# Patient Record
Sex: Male | Born: 1960 | Race: White | Marital: Married | Smoking: Never smoker
Health system: Northeastern US, Academic
[De-identification: ages and names within clinical notes are randomized; demographics above are authoritative.]

## PROBLEM LIST (undated history)

## (undated) DIAGNOSIS — K5792 Diverticulitis of intestine, part unspecified, without perforation or abscess without bleeding: Secondary | ICD-10-CM

## (undated) DIAGNOSIS — M109 Gout, unspecified: Secondary | ICD-10-CM

## (undated) DIAGNOSIS — N200 Calculus of kidney: Secondary | ICD-10-CM

## (undated) DIAGNOSIS — I4891 Unspecified atrial fibrillation: Secondary | ICD-10-CM

## (undated) HISTORY — DX: Unspecified atrial fibrillation: I48.91

## (undated) HISTORY — DX: Gout, unspecified: M10.9

## (undated) HISTORY — DX: Diverticulitis of intestine, part unspecified, without perforation or abscess without bleeding: K57.92

## (undated) HISTORY — PX: FOREARM FRACTURE SURGERY: SHX649

## (undated) HISTORY — DX: Calculus of kidney: N20.0

## (undated) HISTORY — PX: ANKLE FRACTURE SURGERY: SHX122

---

## 2014-02-01 DIAGNOSIS — B029 Zoster without complications: Secondary | ICD-10-CM

## 2014-02-01 HISTORY — DX: Zoster without complications: B02.9

## 2017-07-02 HISTORY — PX: INGUINAL HERNIA REPAIR: SHX194

## 2018-06-07 DIAGNOSIS — M1611 Unilateral primary osteoarthritis, right hip: Secondary | ICD-10-CM | POA: Insufficient documentation

## 2018-07-03 HISTORY — PX: UMBILICAL HERNIA REPAIR: SHX196

## 2018-12-03 DIAGNOSIS — A419 Sepsis, unspecified organism: Secondary | ICD-10-CM

## 2018-12-03 DIAGNOSIS — N12 Tubulo-interstitial nephritis, not specified as acute or chronic: Secondary | ICD-10-CM

## 2018-12-03 HISTORY — DX: Tubulo-interstitial nephritis, not specified as acute or chronic: N12

## 2018-12-03 HISTORY — DX: Sepsis, unspecified organism: A41.9

## 2019-02-02 DIAGNOSIS — U071 COVID-19: Secondary | ICD-10-CM

## 2019-02-02 HISTORY — PX: LITHOTRIPSY: SUR834

## 2019-02-02 HISTORY — DX: COVID-19: U07.1

## 2019-10-03 HISTORY — PX: HIP REPLACEMENT: SHX530A

## 2020-01-08 ENCOUNTER — Other Ambulatory Visit: Payer: Self-pay | Admitting: Gastroenterology

## 2020-01-14 ENCOUNTER — Other Ambulatory Visit: Payer: Self-pay | Admitting: Gastroenterology

## 2020-01-18 ENCOUNTER — Other Ambulatory Visit
Admission: RE | Admit: 2020-01-18 | Discharge: 2020-01-18 | Disposition: A | Discharge status: Home / Self Care | Payer: Self-pay | Source: Ambulatory Visit

## 2020-01-18 DIAGNOSIS — N200 Calculus of kidney: Secondary | ICD-10-CM | POA: Insufficient documentation

## 2020-01-18 LAB — URINALYSIS REFLEX TO CULTURE
Blood,UA: NEGATIVE
Glucose,UA: NEGATIVE
Ketones, UA: NEGATIVE
Leuk Esterase,UA: NEGATIVE
Nitrite,UA: NEGATIVE
Protein,UA: NEGATIVE
Specific Gravity,UA: 1.018 (ref 1.002–1.030)
pH,UA: 6.5 (ref 5.0–8.0)

## 2020-02-05 DIAGNOSIS — M7551 Bursitis of right shoulder: Secondary | ICD-10-CM | POA: Insufficient documentation

## 2020-07-03 ENCOUNTER — Telehealth: Payer: Self-pay | Admitting: Gastroenterology

## 2020-07-03 NOTE — Telephone Encounter (Signed)
Called Pt, scheduled NPV and acute for hip pain - he will check with insurance on [place] vs [place] PCP acceptance and will call back once he has insurance info to transfer to [facility]

## 2020-07-03 NOTE — Telephone Encounter (Signed)
Pt's wife Sofie Rower (DOB [birth year] - Pt of Dr Hermelinda Medicus) called, stating he was just added to her insurance plan - needs a PCP in [place] - has a PCP in [facility] for 6 months of the year, having hip pain - needs it replaced but PCP in [place] has been giving him cortisone inj to help before he can get in for procedure, please advise if able to accept as a new patient and address hip pain, call # [phone]

## 2020-07-03 NOTE — Telephone Encounter (Signed)
Ok to schedule  But, please let patient know that I do not do joint injections - he would need to see an orthopedist.  Also he may want to check with insurance that he is allowed to see PCP in both [place] and [place]? I am not sure how that works.

## 2020-07-04 NOTE — Progress Notes (Signed)
Pre-Visit Planning    Health Maintenance Due   Topic Date Due   • COVID-19 Vaccine (1) Never done   • HIV Screening [facility]/[place]  Never done   • Hepatitis C Screening [facility]/[place]  Never done   • Colon Cancer Screening [facility]PSTF  Never done   • IMM-ZOSTER (1 of 2) Never done

## 2020-07-10 ENCOUNTER — Encounter: Payer: Self-pay | Admitting: Family Medicine

## 2020-07-10 ENCOUNTER — Ambulatory Visit: Payer: BLUE CROSS/BLUE SHIELD | Admitting: Family Medicine

## 2020-07-10 VITALS — BP 120/82 | HR 69 | Temp 96.6°F | Ht 76.0 in | Wt 279.0 lb

## 2020-07-10 DIAGNOSIS — Z0000 Encounter for general adult medical examination without abnormal findings: Secondary | ICD-10-CM

## 2020-07-10 DIAGNOSIS — M25511 Pain in right shoulder: Secondary | ICD-10-CM

## 2020-07-10 DIAGNOSIS — Z87442 Personal history of urinary calculi: Secondary | ICD-10-CM

## 2020-07-10 DIAGNOSIS — M109 Gout, unspecified: Secondary | ICD-10-CM

## 2020-07-10 DIAGNOSIS — N12 Tubulo-interstitial nephritis, not specified as acute or chronic: Secondary | ICD-10-CM | POA: Insufficient documentation

## 2020-07-10 DIAGNOSIS — G8929 Other chronic pain: Secondary | ICD-10-CM

## 2020-07-10 DIAGNOSIS — I480 Paroxysmal atrial fibrillation: Secondary | ICD-10-CM

## 2020-07-10 DIAGNOSIS — A419 Sepsis, unspecified organism: Secondary | ICD-10-CM | POA: Insufficient documentation

## 2020-07-10 DIAGNOSIS — I4891 Unspecified atrial fibrillation: Secondary | ICD-10-CM | POA: Insufficient documentation

## 2020-07-10 DIAGNOSIS — M1612 Unilateral primary osteoarthritis, left hip: Secondary | ICD-10-CM

## 2020-07-10 NOTE — Progress Notes (Signed)
Chief Complaint:   Chief Complaint   Patient presents with    New Patient Visit    Hip Pain       Patient ID: Jorge Boyd is a 60 y.o. man     HPI  Here to establish care with primary care doctor locally.  He does have a PCP and specialists in [place] that he sees regularly.  PCP in [facility] - Dr Mindi Junker [facility] [place], [place] - at least twice/year  ID - Dr Leanord Asal   Cardiologist - Dr Andrey Campanile     Recently married Jorge Boyd.   Will be splitting time between [place] and [place]. Likely spending summers in [place].   3 kids, 2 adult granddaughters.      Sepsis   Had umbilical hernia repair in June 2020.  Had a massive infection in 2020 - hospitalized. Was diagnosed with resistant bacteria.    Recovered. Then Nov 2020 had a 105 temp fever. Was septic due to kidney infection.   Hospitalized.   He also had multiple postop infections after right hip replacement in September 2021.  He does follow with ID down in [place].  Unclear why he has had recurrent postoperative infections.    Atrial fibrillation  First and only episode occurred during November 2020 hospitalization when he was septic and febrile.  HR over 200.   Was back in sinus within 1 month.   Since then, takes diltiazem 120 mg daily and aspirin 81 mg daily.   No further episodes of afib.   Now - HR 60s. Exercises daily.     History of kidney stones  March 2021 - had lithotripsy x2. Still had 1 small stone.   Is on HCTZ 25 mg daily, potassium citrate, and tamsulosin for prevention.     Covid-19 infection  Jan 2021  High fever for 2 weeks.   Gets antibody levels checked monthly because he is unvaccinated. Doesn't plan to be vaccinated until antibody levels drop.     ED  Has been an issue since COVID infection.  Takes tadalafil daily. Med is working well.     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 Dr Dayna Barker in [facility]. Multiple post-op infections.     Left hip arthritis  Significant pain 24/7  Pain is in the left groin, radiates down to knee and up into back.  Not taking  medication for it.  Dr Dayna Barker recommended left hip replacement.   Doesn't sleep well because of it - shooting pain with rolling over  Not having surgery on it til after 02/01/21. Likely will have surgery in [place].   Would like to have a steroid injection, because he did very well with right shoulder steroid injection.  Has not had a hip injection.  Requests referral to orthopedics today.    Gout   No flares since starting allopurinol - since around Feb 2020.   Previous gout flares have been in feet, ankles, hands and wrists.  Continues allopurinol 100 mg qAM and 200 mg nightly.     Right shoulder pain  Old injury - landed on it, history of overuse from baseball.   Possible small tear  Responded well to cortisone injection.   Doing exercises.     History of diverticulitis 2-3 times  Has had a colonoscopy.       Patient's medications, allergies, past medical, surgical, social and family histories were personally reviewed and updated in eRecord today.    Patient Active Problem List    Diagnosis Date Noted  Atrial fibrillation 07/10/2020    History of kidney stones 07/10/2020    Bursitis of right shoulder 02/05/2020    Osteoarthritis of right hip 06/07/2018     Past Medical History:   Diagnosis Date    Atrial fibrillation     COVID-19 02/2019    Gout     Nephrolithiasis     Pyelonephritis 12/2018    Sepsis 12/2018    due to pyelonephritis      Past Surgical History:   Procedure Laterality Date    ANKLE FRACTURE SURGERY Left     FOREARM FRACTURE SURGERY Left     HIP REPLACEMENT Right 10/2019    in [facility]    INGUINAL HERNIA REPAIR Right 07/2017    INGUINAL HERNIA REPAIR Left 2012    LITHOTRIPSY  2021    x2    UMBILICAL HERNIA REPAIR  07/2018    incisional hernia repaired     Current Outpatient Medications   Medication Sig    tamsulosin (FLOMAX) 0.4 mg capsule tamsulosin 0.4 mg capsule    tadalafil (CIALIS) 5 MG tablet     potassium citrate (UROCIT-K) 10 mEq (1080 mg) CR tablet Take 10 mEq by mouth daily       hydroCHLOROthiazide (HYDRODIURIL) 25 mg tablet 25 mg    dilTIAZem (TIAZAC, DILTZAC, TAZTIA XT) 120 mg 24 hr capsule diltiazem CD 120 mg capsule,extended release 24 hr    allopurinol (ZYLOPRIM) 300 mg tablet     aspirin 81 mg EC tablet Take 81 mg by mouth daily    Multiple Vitamin (MULTIVITAMIN PO) Take by mouth     No current facility-administered medications for this visit.     Allergies   Allergen Reactions    Demerol Hcl [Meperidine] Nausea And Vomiting     Family History   Adopted: Yes     Social History     Socioeconomic History    Marital status: Married     Spouse name: Not on file    Number of children: Not on file    Years of education: Not on file    Highest education level: Not on file   Tobacco Use    Smoking status: Never Smoker    Smokeless tobacco: Never Used   Substance and Sexual Activity    Alcohol use: Yes     Comment: rare    Drug use: Never    Sexual activity: Yes     Partners: Female     Comment: monogamous   Other Topics Concern    Not on file   Social History Narrative    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       Review of Systems   Constitutional: Positive for weight loss (10 lbs intentional). Negative for chills, fever and malaise/fatigue.   HENT: Positive for tinnitus (bilateral. high pitch, constant. years. ). Negative for congestion, ear pain, hearing loss and sore throat.    Eyes: Negative for blurred vision, double vision, pain, discharge and redness.   Respiratory: Negative for cough, shortness of breath and wheezing.    Cardiovascular: Negative for chest pain, palpitations, orthopnea and leg swelling.   Gastrointestinal: Negative for abdominal pain, blood in stool, constipation, diarrhea, heartburn, nausea and vomiting.   Genitourinary: Negative for dysuria, frequency, hematuria and urgency.        Nocturia 4/night. Drinks 2 gallons of water to prevent kidney stones.   Musculoskeletal: Positive for joint pain (left hip). Negative for  myalgias.   Skin: Negative  for itching and rash.   Neurological: Positive for headaches (stress related, mild, intermittent.). Negative for dizziness, tingling, sensory change and focal weakness.   Endo/Heme/Allergies: Negative for polydipsia. Does not bruise/bleed easily.   Psychiatric/Behavioral: Negative for depression. The patient is not nervous/anxious.        Objective:  BP 120/82 (BP Location: Left arm)    Pulse 69    Temp 35.9 C (96.6 F) (Temporal)    Ht 1.93 m (6\' 4" )    Wt 126.6 kg (279 lb)    SpO2 98%    BMI 33.96 kg/m   Physical Exam  Constitutional:       Appearance: Normal appearance.   HENT:      Head: Normocephalic.      Right Ear: Tympanic membrane, ear canal and external ear normal.      Left Ear: Tympanic membrane, ear canal and external ear normal.      Mouth/Throat:      Mouth: Mucous membranes are moist.      Pharynx: Oropharynx is clear. No oropharyngeal exudate.   Eyes:      General: No scleral icterus.        Right eye: No discharge.         Left eye: No discharge.      Conjunctiva/sclera: Conjunctivae normal.      Pupils: Pupils are equal, round, and reactive to light.   Neck:      Thyroid: No thyromegaly.   Cardiovascular:      Rate and Rhythm: Normal rate and regular rhythm.      Heart sounds: Normal heart sounds. No murmur heard.  Pulmonary:      Effort: Pulmonary effort is normal. No respiratory distress.      Breath sounds: Normal breath sounds.   Abdominal:      General: Bowel sounds are normal. There is no distension.      Palpations: Abdomen is soft. There is no mass.      Tenderness: There is no abdominal tenderness. There is no guarding or rebound.   Musculoskeletal:      Cervical back: Neck supple.      Right lower leg: No edema.      Left lower leg: No edema.   Lymphadenopathy:      Cervical: No cervical adenopathy.   Skin:     General: Skin is warm and dry.      Findings: No rash.   Neurological:      Mental Status: He is alert and oriented to person, place, and time.       Gait: Gait is intact.   Psychiatric:         Mood and Affect: Mood and affect normal.         Behavior: Behavior normal.         Assessment/Plan:    1. Arthritis of left hip  Significant left hip pain, mostly in the groin, but radiates down to the knee and up to back.  Interfering with sleep and activity.  Requesting steroid injection until he can have joint replacement.  - AMB REFERRAL TO ORTHOPEDIC SURGERY    2. History of kidney stones  History of lithotripsy x2 March 2021.   No symptoms of kidney stones currently.  Continues HCTZ 25 mg daily, potassium citrate, and tamsulosin for prevention.   - Aerobic culture; Future  - Urinalysis with reflex to microscopic; Future    3. Chronic right shoulder pain  Has seen orthopedist in [place] for this.  Old injury and overuse.   Responded well to cortisone injection.   Continues home exercises.    4. Paroxysmal atrial fibrillation  Single episode of atrial fibrillation with RVR Nov 2020 while hospitalized with sepsis.  Sees cardiology in [place] - Dr Andrey Campanile.   Continues diltiazem 120 mg daily and aspirin 81 mg daily.   - CBC and differential; Future  - Comprehensive metabolic panel; Future  - Lipid Panel (Reflex to Direct  LDL if Triglycerides more than 400); Future    5. Gout  Previous gout flares have been in feet, ankles, hands and wrists.  Continues allopurinol 100 mg qAM and 200 mg nightly.   - Uric acid; Future    6. Health care maintenance  - CBC and differential; Future  - Comprehensive metabolic panel; Future  - Lipid Panel (Reflex to Direct  LDL if Triglycerides more than 400); Future  - Hemoglobin A1c; Future  - PSA (eff.05-2008); Future    7.  History of postop infections  History of febrile postop infections and sepsis after umbilical hernia repair and right hip replacement.  Unclear why he has had such severe infections.  He does see ID down in [place], Dr Leanord Asal.   Patient requested that if he has symptoms concerning for infection, he be allowed to get  blood work and urine testing done urgently given his history.  Advised him to contact office if this occurs.      Follow up: as needed

## 2020-07-14 ENCOUNTER — Ambulatory Visit: Payer: BLUE CROSS/BLUE SHIELD | Admitting: Orthopedic Surgery

## 2020-07-14 ENCOUNTER — Encounter: Payer: Self-pay | Admitting: Orthopedic Surgery

## 2020-07-14 ENCOUNTER — Ambulatory Visit
Admission: RE | Admit: 2020-07-14 | Discharge: 2020-07-14 | Disposition: A | Discharge status: Home / Self Care | Payer: BLUE CROSS/BLUE SHIELD | Source: Ambulatory Visit

## 2020-07-14 VITALS — BP 135/81 | HR 89 | Ht 76.0 in | Wt 278.0 lb

## 2020-07-14 DIAGNOSIS — M25552 Pain in left hip: Secondary | ICD-10-CM

## 2020-07-14 DIAGNOSIS — M1612 Unilateral primary osteoarthritis, left hip: Secondary | ICD-10-CM

## 2020-07-14 DIAGNOSIS — M76892 Other specified enthesopathies of left lower limb, excluding foot: Secondary | ICD-10-CM

## 2020-07-14 NOTE — H&P (Signed)
CC: Left hip pain    HPI: Jorge Boyd is a 60 y.o. male who presents with many years history of Left hip pain.  It began 2018. It is localized to the groin and radiates to the thigh.  It is worse with activities such as walking, playing softball, standing, and improved with rest.  It does wake them up at night.  The patient is able to reciprocate stairs.  They do not walk with an assistive device.  Their leg lengths feel equal on the affected side.  The patient takes Ibuprofen for the pain, which provides temporary relief.  The patient has tried activity modification, weight loss, and physical therapy which all provide only temporary relief.  The patient has not had an intra-articular injection into the hip.  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  He was interested in a cortisone injection to help him with his season.    The patient denies any radicular symptoms, fevers, chills, or recent falls.    PMH     Past Medical History:   Diagnosis Date    Atrial fibrillation     COVID-19 02/2019    Diverticulitis     Gout     Nephrolithiasis     Pyelonephritis 12/2018    Sepsis 12/2018    due to pyelonephritis     Shingles 2016       PSH  Past Surgical History:   Procedure Laterality Date    ANKLE FRACTURE SURGERY Left     FOREARM FRACTURE SURGERY Left     HIP REPLACEMENT Right 10/2019    in [facility]    INGUINAL HERNIA REPAIR Right 07/2017    INGUINAL HERNIA REPAIR Left 2012    LITHOTRIPSY  2021    x2    UMBILICAL HERNIA REPAIR  07/2018    incisional hernia repaired       Medications  Current Outpatient Medications   Medication    tamsulosin (FLOMAX) 0.4 mg capsule    tadalafil (CIALIS) 5 MG tablet    potassium citrate (UROCIT-K) 10 mEq (1080 mg) CR tablet    hydroCHLOROthiazide (HYDRODIURIL) 25 mg tablet    dilTIAZem (TIAZAC, DILTZAC, TAZTIA XT) 120 mg 24 hr capsule    allopurinol  (ZYLOPRIM) 300 mg tablet    aspirin 81 mg EC tablet    Multiple Vitamin (MULTIVITAMIN PO)     No current facility-administered medications for this visit.       Allergies  Allergies   Allergen Reactions    Demerol Hcl [Meperidine] Nausea And Vomiting       Social History   reports that he has never smoked. He has never used smokeless tobacco. He reports current alcohol use. He reports that he does not use drugs.    Review of Systems  A  review of systems was conducted.  Pertinent positive and negative findings other than what is documented in the hpi are negative.    Physical Examination:  General: well appearing no acute distress at rest  Skin: no evidence of rashes, bruising, open wounds; right hip incision anterior approach well healed    Lower Extremity Musculoskeletal Examination:  Gait: walks with a coxalgic gait   Leg length: on supine examination, leg lengths appear fairly equal .    Peripheral vascular: no edema, lower extremity warm and well perfused    Right Hip: no tenderness to palpation over  greater trochanter, hip ROM flexion 0-100 degrees, internal rotation in flexion 25 degrees with no pain, external rotation in flexion 45 degrees, abduction in flexion 40 degrees, adduction in flexion 35 degrees. has not pain with straight leg raise.  No evidence of subluxation or laxity. Normal muscle tone, no spasticity or atrophy.    Right Knee: Neutral mechanical alignment of the knee.  ROM 0-120 painless.      Left Hip: no tenderness to palpation over greater trochanter, hip ROM flexion 0-100 degrees, internal rotation in flexion 10 degrees with groin pain, external rotation in flexion 35 degrees, abduction in flexion 30 degrees, adduction in flexion 25 degrees.  has groin pain with straight leg raise. Normal muscle tone.    Left Knee: neutral mechanical alignment of the knee.  ROM 0-120 painless.      Neurovascular exam: 5/5 strength in quadriceps/hamstring/gastrocs/tibialis anterior/extensor halluces  longus bilaterally, sensation is intact to light touch SP/DP/TN/saphenous distributions bilaterally, distal extremity is warm and perfused.    Radiographs:   I personally reviewed the following imaging with pertinent findings:  AP and lateral Hip dated today ordered by me was reviewed and interpreted from me today shows severe osteoarthritis of the left hip with complete loss of superolateral joint space, subchondral sclerosis .  No other bony or soft tissue injury seen.    A/P: Jorge Boyd is a 60 y.o. male with severe osteoarthritis of the left hip.  We discussed his diagnosis and treatment options.   I discussed with the patient continued conservative management including anti-inflammatory medications (aleve, advil) and tylenol, activity modification, weight loss, use of assistive devices, and intra-articular injections such as cortisone.  We also discussed the indications and expected recovery from total hip replacement, which he is familiar with due to his right THA previously.  He wanted to pursue a cortisone injection to get him through his softball season, I placed this referral for him.  He can continue ibuprofen/tylenol as tolerated.  He will call me if interested in hip replacement, he thought possibly this winter would work for him.  All questions were answered by me.

## 2020-07-16 ENCOUNTER — Encounter: Payer: Self-pay | Admitting: Orthopedic Surgery

## 2020-07-21 ENCOUNTER — Encounter: Payer: Self-pay | Admitting: Family Medicine

## 2020-07-31 ENCOUNTER — Encounter: Payer: Self-pay | Admitting: Physical Medicine and Rehabilitation

## 2020-07-31 ENCOUNTER — Ambulatory Visit
Payer: BLUE CROSS/BLUE SHIELD | Attending: Physical Medicine and Rehabilitation | Admitting: Physical Medicine and Rehabilitation

## 2020-07-31 VITALS — BP 126/84 | HR 77 | Temp 97.9°F | Ht 76.0 in | Wt 278.0 lb

## 2020-07-31 DIAGNOSIS — M1612 Unilateral primary osteoarthritis, left hip: Secondary | ICD-10-CM | POA: Insufficient documentation

## 2020-07-31 MED ORDER — LIDOCAINE HCL 1 % IJ SOLN *I*
0.5000 mL | Freq: Once | INTRAMUSCULAR | Status: AC | PRN
Start: 2020-07-31 — End: 2020-07-31
  Administered 2020-07-31: .5 mL via INTRA_ARTICULAR

## 2020-07-31 MED ORDER — LIDOCAINE HCL 1 % IJ SOLN *I*
1.5000 mL | Freq: Once | INTRAMUSCULAR | Status: AC | PRN
Start: 2020-07-31 — End: 2020-07-31
  Administered 2020-07-31: 1.5 mL via INTRA_ARTICULAR

## 2020-07-31 MED ORDER — LIDOCAINE HCL 1 % IJ SOLN *I*
1.0000 mL | Freq: Once | INTRAMUSCULAR | Status: AC | PRN
Start: 2020-07-31 — End: 2020-07-31
  Administered 2020-07-31: 1 mL via INTRA_ARTICULAR

## 2020-07-31 MED ORDER — TRIAMCINOLONE ACETONIDE 40 MG/ML IJ SUSP *I*
80.0000 mg | Freq: Once | INTRAMUSCULAR | Status: AC | PRN
Start: 2020-07-31 — End: 2020-07-31
  Administered 2020-07-31: 80 mg via INTRA_ARTICULAR

## 2020-07-31 NOTE — Procedures (Signed)
Procedure: Ultrasound guided left intra-articular hip joint injection      The procedure was carried out under sterile prep with sterile gel.  A brief sonographic assessment of the region showed a small femoral head osteophyte and a small joint effusion. A 22 gauge 3.5 inch needle was introduced and advanced with ultrasound guidance from distal to proximal to the femoral head deep to the capsule, where a mixture of local anesthetic and corticosteroid was injected.     Ultrasound interpretation was performed prior to the procedure to identify the target and any adjacent neurovascular structures. Subsequently, interpretation was performed during real-time needle guidance confirming placement. Post-intervention interpretation was also performed confirming appropriate injectate flow and hemostasis. The patient tolerated the procedure without difficulty. Warning signs and routine aftercare were discussed with the patient.     Daryel Gerald DO  Physical Medicine and Rehabilitation        Large Joint Aspiration/Injection Procedure: L hip joint    Date/Time: 07/31/2020  8:20 AM EDT  Consent given by: patient (Risks, benefits, and alternatives to injection were discussed, including the possibility of pain, bleeding, infection, color change of skin, systemic reaction and lack of clinical improvement. They verbalized understanding.)  Site marked: site marked  Timeout: Immediately prior to procedure a time out was called to verify the correct patient, procedure, equipment, support staff and site/side marked as required     Procedure Details    Location: hip - L hip joint  Preparation: The site was prepped using the usual aseptic technique.  Ultrasound guidance:  Ultrasound was utilized to improve needle visualization, injection accuracy, and anatomic localization.    Anesthetics administered: 1.5 mL lidocaine HCL 1 %; 1 mL lidocaine HCL 1 %; 0.5 mL lidocaine HCL 1 %  Intra-Articular Steroids administered: 80 mg triamcinolone  acetonide 40 MG/ML  Dressing:  A dry, sterile dressing was applied.  Patient tolerance: patient tolerated the procedure well with no immediate complications      For this injection, the patient has agreed to participate in the research study titled "Comparison of ultrasound-guided intraarticular hip injections with and without prior local anesthesia: a randomized controlled trial" (RSRB [id number]). Informed consent was obtained and documented using a Research Subjects Review Board-approved consent form, which will be scanned and added to the patient's electronic health record.

## 2020-07-31 NOTE — Patient Instructions (Signed)
-   My secretary's number is [phone].    Physical Medicine and Rehabilitation - Post Injection Instructions    You received a steroid (cortisone) injection today.     When will I start to experience pain relief?  You may get some immediate relief if numbing medicine (lidocaine/bupivicaine) is used, but this wears off in several hours.  The therapeutic effect from the steroid may take up to 1-2 weeks.    What are the possible side effects?  The most common side effect from injection procedures is a transient increase in pain for the first 24-72 hours. Possible minor, temporary, and treatable side effects include post-injection muscle soreness, localized bruising, swelling or redness, temporary lightheadedness, dizziness or fainting, temporary increased blood sugar or blood pressure. These side effects typically resolve on their own within 1-2 days. Rare (<1%) but more severe risks include bleeding, allergic reaction, or infection  What should I do after my injection?  • You may ice the injection site for local discomfort. Ice should be covered with a cloth - never place directly on skin. Apply for 15 minutes on, then 1 hour off. Repeat as needed.  • Avoid soaking the injection site in water for at least 24 hours (no baths, hot tubs, or pools) - showering is OK.  • Avoid strenuous activity in the injected body part for 2 days after injection.    For what reasons should I contact my physician after an injection?  • A temperature of greater than 100°F that is not improving  • Pain that is severe and worsening  • Severe redness, swelling or drainage from the injection site  · In case of a question or concern between 8 AM and 4:30 PM Monday through Friday, reach out through MyChart (a response through MyChart is often faster) or call your doctor’s office.  · Seek urgent medical care if staff is unavailable.   · For urgent issues before 8 AM or after 4:30 PM, on weekends or holidays, please contact the on-call physician  at [phone].    Office numbers:  Drs. Nailah Luepke, Sidhu, Salim -    Drs. Lazaro, Paul, Hauber, Taddeo -   Drs. Snyder, El Hassan -    Drs. Adler, Nickels, Morrison - [phone]  [phone]  [phone]  [phone]     Billing information for injection procedures  We want you well-informed about billing and insurance issues related to your care. Please read the following information carefully. If you have questions or concerns, our office would be happy to discuss them with you.  · You will receive two bills, one from the physician performing the injection and one from [facility] for the use of the room and medical supplies. The physician bill will come from [facility] and will read “Statement of Professional Services”. The hospital bill will come from [facility]. Many insurance companies will require a separate co-payment/co-insurance for each bill. If you have questions regarding your bill, please reach out to the billing office at [phone].  · We urge you to become familiar, in advance, with what your insurance policy covers, as well as any co-payment and deductibles. We are glad to assist you and answer questions, but payment and insurance coverage are ultimately your responsibility.

## 2020-07-31 NOTE — Progress Notes (Signed)
[facility] Physical Medicine & Rehabilitation Clinic Note  Name: Elvis Laufer   DOB: April 24, 1960   Date: 07/31/2020   Referring Provider:  Rae Halsted, PA     Chief Complaint: Nasiir Monts is a 60 y.o. male who presents with left hip pain.    Subjective     History of Present Illness:  Pain location: Hip (Left hip)  Pain score:   2  Pain frequency: Continuous  Pain description: Aching, Sharp       Goal for today's visit: cortisone    Pain location: groin, lateral and other (down to the knee). Pain began years ago.    Was playing softball, noticed more hip pain, kept playing, pain worsened. His surgeon offered him an injection but he declined at the time. Since then any activity has caused significant pain for days.     The pain has been gradually worsening since onset.     It is aggravated by exercise and sitting.     It is alleviated by nothing.     Associated symptoms: difficulty sleeping due to pain and stiffness       Social Hx/Occupation: Chartered certified accountant, Research scientist (medical)    Current level of function: Active - regular exercise routine with organized exercise multiple times per week    Desired level of function: Very Active - dedicated daily exercise routine    Pertinent medical history:        Diabetes: none       Hypertension: well controlled       Active Smoker: No        Other: history of Afib in the seting of sever sepsis, not chronic, not on anticoagulation    Mental health history: no history of mental health disorder          Treatments tried for the hip:     Home Exercise Program: no relief    Physical Therapy: not tried    Heat: not tried    Ice: no relief   Assistive device: - not tried   Topicals: - no relief    Acetaminophen: no relief   NSAIDs: - no relief    Steroid injections: not tried    Surgery: history of right THA    Objective   Physical Exam:      Left Hip:   Inspection:       Pelvic obliquity: symmetric  Palpation:       Tenderness: no tenderness      Crepitus: No    Range of Motion:        Internal rotation (90/90): 30, with lateral and groin pain      External rotation (90/90): 60, with lateral and groin pain  Special Tests:       Straight leg raise: Negative        FABER: Positive (Groin and lateral pain)      FADIR: Negative        Stinchfield: Negative     Gait:        Gait normal       Assistive device: none    Vitals: BP 126/84    Pulse 77    Temp 36.6 C (97.9 F) (Temporal)    Ht 1.93 m (6\' 4" )    Wt 126.1 kg (278 lb)    BMI 33.84 kg/m      Laboratory  No results found for: PA1C, HA1C, CREAT        Imaging:  X-rays of the left hip performed 07/14/2020 -  I personally reviewed the images on 07/31/2020 which showed: Moderate degenerative changes, with joint space narrowing, subchondral sclerosis and osteophytes, consistent with osteoarthritis.     Assessment     ICD-10-CM ICD-9-CM   1. Primary osteoarthritis of left hip  M16.12 715.15       The presenting problem is chronic, with progression/exacerbation    The patient's description of symptoms, physical examination findings, and review of plain film radiographs are all consistent with a diagnosis of hip osteoarthritis.  We discussed the degenerative and progressive nature of this disease.  We discussed the wide array of treatment options and evidence based recommendations from several of the national and international organizations specializing in the treatment of osteoarthritis.    For this patient, the following non-operative treatment options were strongly considered with high levels of evidence:  - Exercise - low impact exercise, including but not limited to: walking, strengthening, neuromuscular training, and aquatic exercise  - Self-Efficacy and self-management programs  - Appropriate weight management   - Use of Cane  - Oral NSAIDs (as allowable given chronic medical comorbidities)  - Intraarticular glucocorticoid injections (as allowable given chronic medical comorbidities)    The following non-operative treatment options were  considered with lower levels of evidence:  - Heat, therapeutic cooling such as hot or cold packs  - Acetaminophen    The following non-operative treatment options were considered but not recommended for initial treatment. Data regarding these options is conflicting and they are considered when the patient has failed the above, more ideal options:  - None    The following non-operative treatment options were considered but often recommended against and reserved for cases in which the patient has failed more strongly recommended therapies   - Glucosamine and chondroitin         Today's Plan:    Ultimately, the following treatment plan was developed based on discussion of possible treatment options, response to previous treatments, medical/psychosocial barriers, and the patients motivation and desire for specific options:      - Recommended physical exercise in the form of home exercise program  - Discussed use of ice and heat  - Discussed judicious use of acetaminophen  - Discussed judicious use of over the counter NSAIDs    - Ultrasound-guided left intra-articular hip steroid injection today. Risks, benefits and alternatives were discussed.  - He plans to have the left hip replaced in the Fall/Winter but wishes for pain relief in the meantime.     If the above plan fails to improve his symptoms, the following treatment options would be considered next:  - Surgical referral    Monitoring of modifiable risk factors:  HTN status: yes - well controlled  Diabetes status: no  No results found for: HA1C  Active smoker: No  Tobacco Use: Low Risk     Smoking Tobacco Use: Never Smoker    Smokeless Tobacco Use: Never Used      Weight:  BMI Readings from Last 1 Encounters:   07/31/20 33.84 kg/m        Follow up if symptoms worsen or fail to improve.    Meds and Orders Placed this Visit:  1. Primary osteoarthritis of left hip    The above documented evaluation and management was performed separately and in addition to the  procedure.      Timoteo Ace, DO  Physical Medicine & Rehabilitation  Please excuse any grammatical errors, this note was partially typed/dictated at point of care.

## 2020-08-08 ENCOUNTER — Ambulatory Visit: Payer: BLUE CROSS/BLUE SHIELD | Admitting: Physical Medicine and Rehabilitation

## 2020-08-30 ENCOUNTER — Encounter: Payer: Self-pay | Admitting: Family Medicine

## 2020-10-03 ENCOUNTER — Other Ambulatory Visit
Admission: RE | Admit: 2020-10-03 | Discharge: 2020-10-03 | Disposition: A | Discharge status: Home / Self Care | Payer: BLUE CROSS/BLUE SHIELD | Source: Ambulatory Visit | Attending: Family Medicine | Admitting: Family Medicine

## 2020-10-03 DIAGNOSIS — Z0000 Encounter for general adult medical examination without abnormal findings: Secondary | ICD-10-CM | POA: Insufficient documentation

## 2020-10-03 DIAGNOSIS — Z87442 Personal history of urinary calculi: Secondary | ICD-10-CM | POA: Insufficient documentation

## 2020-10-03 DIAGNOSIS — I480 Paroxysmal atrial fibrillation: Secondary | ICD-10-CM | POA: Insufficient documentation

## 2020-10-03 DIAGNOSIS — M109 Gout, unspecified: Secondary | ICD-10-CM | POA: Insufficient documentation

## 2020-10-03 LAB — LIPID PANEL
Chol/HDL Ratio: 5.2
Cholesterol: 199 mg/dL
HDL: 38 mg/dL — ABNORMAL LOW (ref 40–60)
LDL Calculated: 138 mg/dL — AB
Non HDL Cholesterol: 161 mg/dL
Triglycerides: 114 mg/dL

## 2020-10-03 LAB — COMPREHENSIVE METABOLIC PANEL
ALT: 26 U/L (ref 0–50)
AST: 24 U/L (ref 0–50)
Albumin: 4.7 g/dL (ref 3.5–5.2)
Alk Phos: 96 U/L (ref 40–130)
Anion Gap: 12 (ref 7–16)
Bilirubin,Total: 0.9 mg/dL (ref 0.0–1.2)
CO2: 25 mmol/L (ref 20–28)
Calcium: 9.9 mg/dL (ref 8.6–10.2)
Chloride: 101 mmol/L (ref 96–108)
Creatinine: 1.15 mg/dL (ref 0.67–1.17)
Glucose: 103 mg/dL — ABNORMAL HIGH (ref 60–99)
Lab: 22 mg/dL — ABNORMAL HIGH (ref 6–20)
Potassium: 4.2 mmol/L (ref 3.3–5.1)
Sodium: 138 mmol/L (ref 133–145)
Total Protein: 6.9 g/dL (ref 6.3–7.7)
eGFR BY CREAT: 74 *

## 2020-10-03 LAB — URINALYSIS WITH REFLEX TO MICROSCOPIC
Blood,UA: NEGATIVE
Glucose,UA: NEGATIVE
Ketones, UA: NEGATIVE
Nitrite,UA: NEGATIVE
Protein,UA: NEGATIVE
Specific Gravity,UA: 1.014 (ref 1.002–1.030)
pH,UA: 6.5 (ref 5.0–8.0)

## 2020-10-03 LAB — URIC ACID: Urate: 6.7 mg/dL (ref 3.9–9.0)

## 2020-10-03 LAB — CBC AND DIFFERENTIAL
Baso # K/uL: 0.1 10*3/uL (ref 0.0–0.1)
Basophil %: 1.2 %
Eos # K/uL: 0.2 10*3/uL (ref 0.0–0.5)
Eosinophil %: 2.5 %
Hematocrit: 48 % (ref 40–51)
Hemoglobin: 16.5 g/dL (ref 13.7–17.5)
IMM Granulocytes #: 0 10*3/uL (ref 0.0–0.0)
IMM Granulocytes: 0.3 %
Lymph # K/uL: 1.5 10*3/uL (ref 1.3–3.6)
Lymphocyte %: 20.2 %
MCH: 32 pg (ref 26–32)
MCHC: 34 g/dL (ref 32–37)
MCV: 92 fL (ref 79–92)
Mono # K/uL: 0.7 10*3/uL (ref 0.3–0.8)
Monocyte %: 9.4 %
Neut # K/uL: 5 10*3/uL (ref 1.8–5.4)
Nucl RBC # K/uL: 0 10*3/uL (ref 0.0–0.0)
Nucl RBC %: 0 /100 WBC (ref 0.0–0.2)
Platelets: 229 10*3/uL (ref 150–330)
RBC: 5.2 MIL/uL (ref 4.6–6.1)
RDW: 13.5 % (ref 11.6–14.4)
Seg Neut %: 66.4 %
WBC: 7.6 10*3/uL (ref 4.2–9.1)

## 2020-10-03 LAB — URINE MICROSCOPIC (IQ200)
Bacteria,UA: NONE SEEN
Hyaline Casts,UA: NONE SEEN /lpf (ref 0–5)
Squam Epithel,UA: NONE SEEN /lpf (ref 0–?)

## 2020-10-03 LAB — PSA (EFF.4-2010): PSA (eff. 4-2010): 2.76 ng/mL (ref 0.00–4.00)

## 2020-10-03 LAB — HEMOGLOBIN A1C: Hemoglobin A1C: 5.5 %

## 2020-10-04 LAB — AEROBIC CULTURE: Aerobic Culture: 0

## 2020-10-15 ENCOUNTER — Encounter: Payer: Self-pay | Admitting: Family Medicine

## 2020-10-16 NOTE — Progress Notes (Deleted)
Pre-Visit Planning    Health Maintenance Due   Topic Date Due   • COVID-19 Vaccine (1) Never done   • HIV Screening USPSTF/[place]  Never done   • Hepatitis C Screening USPSTF/[place]  Never done   • Colon Cancer Screening USPSTF  Never done   • IMM-ZOSTER (1 of 2) Never done   • IMM-INFLUENZA (1) Never done

## 2020-11-14 ENCOUNTER — Encounter: Payer: Self-pay | Admitting: Family Medicine

## 2020-11-21 ENCOUNTER — Other Ambulatory Visit: Payer: Self-pay | Admitting: Orthopedic Surgery

## 2020-11-21 ENCOUNTER — Telehealth: Payer: Self-pay

## 2020-11-21 DIAGNOSIS — M1612 Unilateral primary osteoarthritis, left hip: Secondary | ICD-10-CM

## 2020-11-21 NOTE — Telephone Encounter (Signed)
Dx: Osteoarthritis of left hip [M76.72 (ICD-10-CM)]       Order History  Outpatient  Date/Time Action Taken User Additional Information   11/21/20 1426 Sign Zella Ball, MD      Order Details    Frequency Duration Priority Order Class   None None Routine Clinic Performed     Comments    PCP clearance   December           Order Questions    Question Answer Comment   Nurse Navigator Review Required No    Procedure: 27130 - Total Hip Arthroplasty December   Approach Anterior    Laterality Left    Note: Enter the laterality for the procedure.   Duration (Minutes): 120    Anesthesia Regional    Equipment hana bed, fluoroscopy, Depuy pinacle and actis, ancef and vanco preop    Pre Procedural Lab Orders: MRSA Panel     CMP - Lab17     CBC - CNO709     Protime-INR - Lab320     APTT - Lab325     Type and Screen - Lab276     Hemoglobin A1c - [id number]    Pre Procedural Imaging Orders: Outpatient Left Hip and Pelvis - Order Panel      LM in [facility] to complete [facility] HEALTH HISTORY QUESTIONNAIRE ASAP  Booking Order sent to [employer]

## 2020-11-24 ENCOUNTER — Encounter: Payer: Self-pay | Admitting: Orthopedic Surgery

## 2020-11-24 ENCOUNTER — Other Ambulatory Visit: Payer: Self-pay | Admitting: Orthopedic Surgery

## 2020-11-24 DIAGNOSIS — Z01818 Encounter for other preprocedural examination: Secondary | ICD-10-CM

## 2020-12-10 ENCOUNTER — Encounter: Payer: Self-pay | Admitting: Family Medicine

## 2021-01-04 ENCOUNTER — Encounter: Payer: Self-pay | Admitting: Family Medicine

## 2021-01-04 DIAGNOSIS — M545 Low back pain, unspecified: Secondary | ICD-10-CM

## 2021-01-04 DIAGNOSIS — R509 Fever, unspecified: Secondary | ICD-10-CM

## 2021-01-05 ENCOUNTER — Encounter: Payer: Self-pay | Admitting: Family Medicine

## 2021-01-05 NOTE — Telephone Encounter (Signed)
Can we get pt in for evaluation with Dr. Barron Alvine? We cant order testing with out him being seen. Please call pt if there is an opening. If you could let me know I would appreciate it. Val S LPN

## 2021-01-05 NOTE — Telephone Encounter (Signed)
Yes ok for tomorrow as long as pt feels he can wait. He needs to be seen asap as UTI can get bad very quickly. He can go to urgent care. We can not treat over the phone. Val S LPN

## 2021-01-05 NOTE — Telephone Encounter (Signed)
Nothing left for today and only same days tomorrow please advise okay to use one?

## 2021-01-06 ENCOUNTER — Other Ambulatory Visit: Payer: Self-pay | Admitting: Family Medicine

## 2021-01-06 ENCOUNTER — Encounter: Payer: Self-pay | Admitting: Family Medicine

## 2021-01-06 ENCOUNTER — Other Ambulatory Visit
Admission: RE | Admit: 2021-01-06 | Discharge: 2021-01-06 | Disposition: A | Discharge status: Home / Self Care | Payer: BLUE CROSS/BLUE SHIELD | Source: Ambulatory Visit | Attending: Family Medicine | Admitting: Family Medicine

## 2021-01-06 DIAGNOSIS — R509 Fever, unspecified: Secondary | ICD-10-CM | POA: Insufficient documentation

## 2021-01-06 DIAGNOSIS — N390 Urinary tract infection, site not specified: Secondary | ICD-10-CM

## 2021-01-06 DIAGNOSIS — M545 Low back pain, unspecified: Secondary | ICD-10-CM | POA: Insufficient documentation

## 2021-01-06 DIAGNOSIS — N12 Tubulo-interstitial nephritis, not specified as acute or chronic: Secondary | ICD-10-CM

## 2021-01-06 LAB — CBC AND DIFFERENTIAL
Baso # K/uL: 0.1 10*3/uL (ref 0.0–0.1)
Basophil %: 1 %
Eos # K/uL: 0.2 10*3/uL (ref 0.0–0.5)
Eosinophil %: 2.6 %
Hematocrit: 50 % (ref 40–51)
Hemoglobin: 16.4 g/dL (ref 13.7–17.5)
IMM Granulocytes #: 0 10*3/uL (ref 0.0–0.0)
IMM Granulocytes: 0.3 %
Lymph # K/uL: 1.3 10*3/uL (ref 1.3–3.6)
Lymphocyte %: 14.5 %
MCH: 31 pg (ref 26–32)
MCHC: 33 g/dL (ref 32–37)
MCV: 93 fL — ABNORMAL HIGH (ref 79–92)
Mono # K/uL: 0.9 10*3/uL — ABNORMAL HIGH (ref 0.3–0.8)
Monocyte %: 9.3 %
Neut # K/uL: 6.7 10*3/uL — ABNORMAL HIGH (ref 1.8–5.4)
Nucl RBC # K/uL: 0 10*3/uL (ref 0.0–0.0)
Nucl RBC %: 0 /100 WBC (ref 0.0–0.2)
Platelets: 261 10*3/uL (ref 150–330)
RBC: 5.3 MIL/uL (ref 4.6–6.1)
RDW: 13.2 % (ref 11.6–14.4)
Seg Neut %: 72.3 %
WBC: 9.3 10*3/uL — ABNORMAL HIGH (ref 4.2–9.1)

## 2021-01-06 LAB — URINALYSIS WITH REFLEX TO MICROSCOPIC
Glucose,UA: NEGATIVE
Ketones, UA: NEGATIVE
Nitrite,UA: POSITIVE — AB
Specific Gravity,UA: 1.018 (ref 1.002–1.030)
pH,UA: 7 (ref 5.0–8.0)

## 2021-01-06 LAB — COMPREHENSIVE METABOLIC PANEL
ALT: 27 U/L (ref 0–50)
AST: 20 U/L (ref 0–50)
Albumin: 4.7 g/dL (ref 3.5–5.2)
Alk Phos: 112 U/L (ref 40–130)
Anion Gap: 14 (ref 7–16)
Bilirubin,Total: 0.7 mg/dL (ref 0.0–1.2)
CO2: 28 mmol/L (ref 20–28)
Calcium: 9.9 mg/dL (ref 8.6–10.2)
Chloride: 100 mmol/L (ref 96–108)
Creatinine: 1.13 mg/dL (ref 0.67–1.17)
Glucose: 111 mg/dL — ABNORMAL HIGH (ref 60–99)
Lab: 18 mg/dL (ref 6–20)
Potassium: 4.3 mmol/L (ref 3.3–5.1)
Sodium: 142 mmol/L (ref 133–145)
Total Protein: 6.8 g/dL (ref 6.3–7.7)
eGFR BY CREAT: 75 *

## 2021-01-06 LAB — URINE MICROSCOPIC (IQ200): WBC,UA: >50 /hpf — AB (ref 0–5)

## 2021-01-06 MED ORDER — CIPROFLOXACIN HCL 500 MG PO TABS *I*
500.0000 mg | ORAL_TABLET | Freq: Two times a day (BID) | ORAL | 0 refills | Status: DC
Start: 2021-01-06 — End: 2021-01-08

## 2021-01-07 MED ORDER — NITROFURANTOIN MONOHYD MACRO 100 MG PO CAPS *I*
100.0000 mg | ORAL_CAPSULE | Freq: Two times a day (BID) | ORAL | 0 refills | Status: AC
Start: 2021-01-07 — End: 2021-01-17

## 2021-01-07 NOTE — Telephone Encounter (Signed)
Please review. Patient calling back about message sent.

## 2021-01-07 NOTE — Telephone Encounter (Signed)
Aerobic culture  Order: [id number]   Status: Preliminary result    Visible to patient: No (not released)    Dx: Low back pain; Fever, unspecified fev...   Specimen Information: Urine (Clean catch, voided, midstream); NO COLLECTION METHOD    0 Result Notes  Component 1 d ago    Aerobic Culture . P    Resulting Agency [facility]           Narrative  Performed by: [facility]  No growth to date      Specimen Collected: 01/06/21 08:22 Last Resulted: 01/06/21 18:03                1 Result Note  Component Ref Range & Units 1 d ago 3 mo ago   RBC,UA 0 - 2 /hpf 21-50Abnormal  0-2    WBC,UA 0 - 5 /hpf >50Abnormal  0-5    Bacteria,UA None Seen - 1+ 2+Abnormal  None Seen    Hyaline Casts,UA 0 - 5 /lpf 0-5  None Seen    Squam Epithel,UA 0-1+ /lpf 1+  None Seen    Resulting Agency  [facility]              Specimen Collected: 01/06/21 08

## 2021-01-08 ENCOUNTER — Ambulatory Visit: Payer: BLUE CROSS/BLUE SHIELD | Admitting: Family Medicine

## 2021-01-08 ENCOUNTER — Other Ambulatory Visit: Payer: Self-pay | Admitting: Family Medicine

## 2021-01-08 VITALS — BP 120/84 | HR 83 | Temp 98.5°F

## 2021-01-08 DIAGNOSIS — N12 Tubulo-interstitial nephritis, not specified as acute or chronic: Secondary | ICD-10-CM

## 2021-01-08 MED ORDER — NITROFURANTOIN MONOHYD MACRO 100 MG PO CAPS *I*
100.0000 mg | ORAL_CAPSULE | Freq: Two times a day (BID) | ORAL | 0 refills | Status: AC
Start: 2021-01-08 — End: 2021-01-17

## 2021-01-08 NOTE — Progress Notes (Signed)
Video Visit     Location of Patient: home    Location of Telemedicine Provider: hospital / clinical location    Other participants in telemedicine encounter and roles:  none    This is an established patient visit.    Reason for visit: Urinary Problem      HPI  Jorge Boyd is a 60 year old man with a history of atrial fibrillation, kidney stones, pyelonephritis, and osteoarthritis, being evaluated for urine infection.     01/06/21 urine tests:   >50 WBC/hpf, 21-50 RBC/hpf  Culture growing ESBL E coli sensitive for nitrofurantoin, zosyn, ertapenem    Symptoms started about 5 days ago.   Was having pounding pain in right kidney. 2/10 pain. Punching.   Pain was waxing/waning.   Fever started with back pain. Tmax 101.something. Intermittent fever.     No nausea/vomiting.   No dysuria. No pain with urination.   No hematuria.   No rectal pain.   No abdominal pain.   Has been more fatigued for the last 2 weeks.     First dose of macrobid was last night.   Feeling better today - improved energy.     8-9 kidney infections in the last few years.   History of lithotripsy due to concern for recurrent infections.   Urologist in [facility].   1 known kidney stone - hasn't been able to pass it.   Last kidney imaging - almost 1 year ago.       Patient's problem list, allergies, and medications were reviewed and updated as appropriate.  Please see the EHR for full details.    Exam and data reviewed:  Gen: well appearing, in no distress  Resp: Speaking in full sentences.  No increased work of breathing.      Assessment/Plan:    1. Pyelonephritis  60 year old man with a history of kidney stones and multiple previous episodes of pyelonephritis, and history of urosepsis, presenting today with 4 to 5 days of right back pain in the area of kidney, waxing and waning, associated with fever.  No urinary symptoms, abdominal pain, nausea or vomiting.  UA grossly positive, and cultures growing ESBL E. coli.  In the past, he has also grown ESBL E. coli, and  been treated successfully with Macrobid.  Patient does not have any symptoms to suggest prostatitis.  Mild pain makes me think he does not have an obstructing kidney stone.  Improvement in energy and fever since starting Macrobid last night suggests against developing sepsis.  Symptoms likely due to pyelonephritis.  Discussed with patient that Macrobid is not a preferred antibiotic for treatment of pyelonephritis, but that given culture results, and improvement in symptoms since starting it, I do agree it is the best option.  Will treat for total course of 14 days to make sure infection is eradicated.  Advised patient that if he has fever, vomiting, worsening back pain, or other concerning symptoms in the next few days, he needs to go to the ED for IV antibiotics.  If back pain persists after 48 to 72 hours of Macrobid, should consider imaging for kidney stone.        Follow-up: As needed        Consent was obtained from the patient to complete this video visit; including the potential for financial liability.          [name], MD

## 2021-01-08 NOTE — Progress Notes (Signed)
Pre-Visit Planning    Health Maintenance Due   Topic Date Due   • COVID-19 Vaccine (1) Never done   • HIV Screening USPSTF/[place]  Never done   • Hepatitis C Screening USPSTF/  Never done   • Colon Cancer Screening USPSTF  Never done   • IMM-ZOSTER (1 of 2) Never done   • IMM-INFLUENZA (1) Never done

## 2021-01-09 LAB — AEROBIC CULTURE

## 2021-01-21 ENCOUNTER — Inpatient Hospital Stay
Admission: RE | Admit: 2021-01-21 | Payer: BLUE CROSS/BLUE SHIELD | Source: Ambulatory Visit | Admitting: Orthopedic Surgery

## 2021-01-21 ENCOUNTER — Encounter: Admission: RE | Payer: Self-pay | Source: Ambulatory Visit

## 2021-01-21 SURGERY — ARTHROPLASTY, HIP, TOTAL, ANTERIOR APPROACH
Anesthesia: Regional | Site: Hip | Laterality: Left

## 2021-01-30 ENCOUNTER — Other Ambulatory Visit: Payer: Self-pay | Admitting: Family Medicine

## 2021-02-05 ENCOUNTER — Ambulatory Visit: Payer: BLUE CROSS/BLUE SHIELD | Admitting: Orthopedic Surgery

## 2021-02-26 ENCOUNTER — Other Ambulatory Visit: Payer: Self-pay | Admitting: Family Medicine

## 2021-02-26 MED ORDER — HYDROCHLOROTHIAZIDE 25 MG PO TABS *I*
25.0000 mg | ORAL_TABLET | Freq: Every morning | ORAL | 1 refills | Status: DC
Start: 2021-02-26 — End: 2021-04-20

## 2021-02-26 MED ORDER — TAMSULOSIN HCL 0.4 MG PO CAPS *I*
0.4000 mg | ORAL_CAPSULE | Freq: Every day | ORAL | 1 refills | Status: DC
Start: 2021-02-26 — End: 2021-05-13

## 2021-02-26 NOTE — Telephone Encounter (Signed)
Last office visit:   07/10/2020  Last telemedicine visit:  01/08/2021  Last PA office visit:  Visit date not found  Patients upcoming appointments:  No future appointments.  Recent Lab results:  GENERAL CHEMISTRY   Recent Labs     01/06/21  0822 10/03/20  1013   NA 142 138   K 4.3 4.2   CL 100 101   CO2 28 25   GAP 14 12   UN 18 22*   CREAT 1.13 1.15   GLU 111* 103*   CA 9.9 9.9   URIC  --  6.7      LIPID PROFILE   Recent Labs     10/03/20  1013   CHOL 199   TRIG 114   HDL 38*   LDLC 138*      LIVER PROFILE   Recent Labs     01/06/21  0822 10/03/20  1013   ALT 27 26   AST 20 24   ALK 112 96   TB 0.7 0.9      DIABETES THYROID   Recent Labs     10/03/20  1013   HA1C 5.5    No value within the past 365 days      Pending/Orders Labs:  Lab Frequency Next Occurrence   COVID-19 PCR Once 11/24/2020

## 2021-03-05 ENCOUNTER — Ambulatory Visit: Payer: BLUE CROSS/BLUE SHIELD | Admitting: Orthopedic Surgery

## 2021-03-10 ENCOUNTER — Encounter: Payer: Self-pay | Admitting: Family Medicine

## 2021-03-10 ENCOUNTER — Encounter: Payer: Self-pay | Admitting: Orthopedic Surgery

## 2021-03-10 NOTE — Telephone Encounter (Signed)
Pt needs letter for Dentist if you would like him to have antibiotics before dental work . Val S LPN

## 2021-03-23 ENCOUNTER — Encounter: Payer: Self-pay | Admitting: Family Medicine

## 2021-03-24 ENCOUNTER — Ambulatory Visit: Payer: BLUE CROSS/BLUE SHIELD | Admitting: Emergency Medicine

## 2021-03-24 ENCOUNTER — Ambulatory Visit: Payer: Self-pay

## 2021-03-24 DIAGNOSIS — R21 Rash and other nonspecific skin eruption: Secondary | ICD-10-CM

## 2021-03-24 MED ORDER — METHYLPREDNISOLONE 4 MG PO TBPK *A*
ORAL_TABLET | ORAL | 0 refills | Status: DC
Start: 2021-03-24 — End: 2021-04-20

## 2021-03-24 NOTE — Patient Instructions (Signed)
HOME CARE INSTRUCTIONS FOR RASH  Avoid the substance that caused your rash.  Do not scratch your rash. This can cause infection.  You may take cool baths to help stop itching.  Only take over-the-counter or prescription medicines as directed by your caregiver.  Keep all follow-up appointments as directed by your caregiver.

## 2021-03-24 NOTE — Progress Notes (Signed)
[facility]:  MyChart E-Visit    Patient ID:  Jorge Boyd is a 61 y.o. year old male.  PCP:  [name], MD  Reason for visit:  Rash on face  Patient questionnaire:  Patient's answers were reviewed in detail.   Patient's problem list, allergies, and medications: were reviewed.     Assessment / Diagnosis:   Rash and nonspecific skin eruption    Other orders  - methylPREDNISolone (MEDROL PAK) 4 MG tablet pack; Take according to package directions (6 day supply)  Dispense: 21 tablet; Refill: 0      Plan / Patient Instructions for [name]:    You are being diagnosed with allergic type rash based on the symptoms you reported. Oral steroids (medication you  take  by  mouth)  were  prescribed.  Please  take  these  as  Directed.    Try eliminating things you may have used on your face, such as soaps/detergents. See if the rash does not return once something is eliminated.     If  you  develop  fever,  lip  or  tongue  swelling,  pain  on  or  around  area  of  rash,  rapidly  spreading redness/streaking from rash, please contact your PCPsoffice  or  seek  emergency  medical  attention immediately.    If you are worsening or not improving after 2-3 days, please call your primary care provider for further evaluation and management of this rash.     Signed: Hermelinda Medicus, NP on 03/24/2021 at 11:30 AM.  Time: 11-20 minutes reviewing patient information, differential diagnoses, and creating assessment/plan.

## 2021-04-01 ENCOUNTER — Other Ambulatory Visit: Payer: Self-pay

## 2021-04-01 ENCOUNTER — Emergency Department: Payer: BLUE CROSS/BLUE SHIELD

## 2021-04-01 ENCOUNTER — Emergency Department
Admission: EM | Admit: 2021-04-01 | Discharge: 2021-04-01 | Disposition: A | Discharge status: Home / Self Care | Payer: BLUE CROSS/BLUE SHIELD | Source: Ambulatory Visit | Attending: Emergency Medicine | Admitting: Emergency Medicine

## 2021-04-01 ENCOUNTER — Encounter: Payer: Self-pay | Admitting: Emergency Medicine

## 2021-04-01 ENCOUNTER — Encounter: Payer: Self-pay | Admitting: Family Medicine

## 2021-04-01 ENCOUNTER — Encounter: Payer: Self-pay | Admitting: Orthopedic Surgery

## 2021-04-01 DIAGNOSIS — M791 Myalgia, unspecified site: Secondary | ICD-10-CM

## 2021-04-01 DIAGNOSIS — W109XXA Fall (on) (from) unspecified stairs and steps, initial encounter: Secondary | ICD-10-CM

## 2021-04-01 DIAGNOSIS — Y9289 Other specified places as the place of occurrence of the external cause: Secondary | ICD-10-CM | POA: Insufficient documentation

## 2021-04-01 DIAGNOSIS — Y998 Other external cause status: Secondary | ICD-10-CM | POA: Insufficient documentation

## 2021-04-01 DIAGNOSIS — W1830XA Fall on same level, unspecified, initial encounter: Secondary | ICD-10-CM | POA: Insufficient documentation

## 2021-04-01 DIAGNOSIS — W19XXXA Unspecified fall, initial encounter: Secondary | ICD-10-CM

## 2021-04-01 DIAGNOSIS — M25512 Pain in left shoulder: Secondary | ICD-10-CM

## 2021-04-01 DIAGNOSIS — S4992XA Unspecified injury of left shoulder and upper arm, initial encounter: Secondary | ICD-10-CM

## 2021-04-01 DIAGNOSIS — Y9389 Activity, other specified: Secondary | ICD-10-CM | POA: Insufficient documentation

## 2021-04-01 LAB — HM HIV SCREENING OFFERED

## 2021-04-01 MED ORDER — ACETAMINOPHEN 500 MG PO TABS *I*
1000.0000 mg | ORAL_TABLET | Freq: Once | ORAL | Status: AC
Start: 2021-04-01 — End: 2021-04-01
  Administered 2021-04-01: 1000 mg via ORAL
  Filled 2021-04-01: qty 2

## 2021-04-01 NOTE — ED Triage Notes (Signed)
Pt states he was walking up stairs this morning when his dog cut him off. He made a sudden move with left arm to catch his balance and had immediate pain in left shoulder. He feels like he tore something in left shoulder.

## 2021-04-01 NOTE — ED Provider Notes (Addendum)
History     Chief Complaint   Patient presents with    Shoulder Pain     Patient is a 61 year old male past medical history of atrial fibrillation and shoulder bursitis who presents to the emergency department for evaluation of a left shoulder injury.  He states that approximately 430 he was taking his dog out when the dog cut in front of him causing him to fall and land awkwardly on his shoulder.  He does not remember exactly how he landed but states that his shoulder felt like it was out of place.  He is able to tug on it slightly with relief.  Ever since he has been unable to lift his left arm past 90 degrees in front of him.  He denies numbness and tingling in his extremity as well as discoloration.  Denies head injury as well as neck injury.  Denies pain in his left wrist and elbow.      History provided by:  Patient and medical records  Language interpreter used: No          Medical/Surgical/Family History     Past Medical History:   Diagnosis Date    Atrial fibrillation     COVID-19 02/2019    Diverticulitis     Gout     Nephrolithiasis     Pyelonephritis 12/2018    ESBL E coli    Sepsis 12/2018    due to pyelonephritis     Shingles 2016        Patient Active Problem List   Diagnosis Code    Atrial fibrillation I48.91    Bursitis of right shoulder M75.51    Osteoarthritis of right hip M16.11    History of kidney stones Z87.442            Past Surgical History:   Procedure Laterality Date    ANKLE FRACTURE SURGERY Left     FOREARM FRACTURE SURGERY Left     HIP REPLACEMENT Right 10/2019    in [place] Right 07/2017    INGUINAL HERNIA REPAIR Left 2012    LITHOTRIPSY  [id number]    x2    UMBILICAL HERNIA REPAIR  07/2018    incisional hernia repaired     Family History   Adopted: Yes          Social History     Tobacco Use    Smoking status: Never    Smokeless tobacco: Never   Substance Use Topics    Alcohol use: Yes     Comment: rare    Drug use: Never     Living Situation      Questions Responses    Patient lives with     Homeless     Caregiver for other family member     External Services     Employment     Domestic Violence Risk                 Review of Systems   Review of Systems   Constitutional: Negative for activity change and fever.   Musculoskeletal: Positive for arthralgias and myalgias. Negative for back pain and joint swelling.   Skin: Negative for color change.   All other systems reviewed and are negative.      Physical Exam     Triage Vitals  Triage Start: Start, (04/01/21 [id number])   First Recorded BP: 171/85, Resp: 13, Temp: 36.1 C (97 F), Temp src: Tympanic Oxygen Therapy  SpO2: 95 %, Oximetry Source: Rt Hand, O2 Device: None (Room air), Heart Rate: 70, (04/01/21 0727)  .  First Pain Reported  0-10 Scale: 8, Pain Location/Orientation: Shoulder Left, (04/01/21 0820)       Physical Exam  Vitals reviewed.   Constitutional:       Appearance: Normal appearance.      Comments: Patient is sitting comfortably in his chair in no acute distress   HENT:      Head: Normocephalic and atraumatic.      Comments: No visible signs of head trauma  Musculoskeletal:      Comments: No obvious swelling or deformity of the patient's shoulder.  No bony tenderness over the clavicle or scapula.  He does have difficulty getting his left shoulder straight in front of him.  Good internal rotation.  Full strength of bilateral elbows and wrist.   Neurological:      Mental Status: He is alert and oriented to person, place, and time.      Comments: Full sensation intact to the patient's median, radial, and ulnar nerves.  Able to give thumbs up, interosseous strength, squeeze providers fingers, and extend wrist.         Medical Decision Making     Assessment:  Patient is a 61 year old male present emergency department for evaluation of a left shoulder injury.  He states that his dog ran in front of him and forced him to fall awkwardly on the shoulder.  Notes initial pain and discomfort and difficulty  extending the arm in front of him and holding it.  No bony tenderness noted over the clavicle or scapula.    Differential diagnosis:  Contusion  Fracture  Dislocation  Neurovascular compromise not evident on exam      Plan:  Orders Placed This Encounter      * Shoulder LEFT standard AP, Grashey, and Lateral views      ED/UC REFERRAL TO ORTHO      HM HIV SCREENING OFFERED    Medications  acetaminophen (TYLENOL) tablet 1,000 mg (1,000 mg Oral Given 04/01/21 0825)      Review of existing & external labs / records: Reviewed patient's past medical record and history    Independent interpretation of imaging: On independent review of x-ray, no signs of acute fracture or dislocation.    ED Course and Disposition:  Patient does have decreased strength and range of motion of the left shoulder.  No focal bony abnormalities.  Provide patient with outpatient Ortho follow-up although he states he does have his own orthopedist.  Advised alternate Tylenol and ibuprofen as well as doing gentle range of motion exercises.  Advised rest and ice.  Gave patient return precautions and he verbalized understanding.  He is reassured that there is no signs of bony abnormality.  At this point, patient will be discharged with close follow-up.  No further care at this time.        Labs Reviewed - No data to display  * Shoulder LEFT standard AP, Grashey, and Lateral views    (Results Pending)            Oneal Deputy, PA      APP Review:    I had face-to-face interaction with the patient on 04/01/2021.    I was asked by APP to see this patient due to the complexity of the current medical presentation.      I personally saw the patient and performed a substantive portion of the history, exam  and medical decision making.     I have reviewed and agree with the above documentation and, in addition:    The history is notable for 25M presenting with L shoulder injury after falling.    Exam is notable for No deformity, no focal bony tenderness, mild  diffuse muscular tenderness at proximal humerus.    Patient at risk for Fracture/dislocation, less likely neurovascular injury.    Our plan is XRs reviewed - no acute fracture, will discharge with instructions for conservative management, PCP follow up.          Author:  Pearletha Forge, MD          Oneal Deputy, [place]  04/01/21 [id number]       Pearletha Forge, MD  04/01/21 2032

## 2021-04-01 NOTE — Discharge Instructions (Signed)
Today you were seen in the emergency department for evaluation of a left shoulder injury.  X-ray revealed no signs of acute fracture or dislocation.  You are likely experiencing a soft tissue injury whether that be muscular or nerve related.  Please continue to alternate the use of Tylenol and ibuprofen at home every 4 hours.  Please do not exceed maxillae dose of 3 g Tylenol or 2400 mg ibuprofen as this can have toxic effects on your liver and/or kidneys.  Additionally you can continue to ice the arm.  Please do gentle range of motion exercises daily as well.  I have provided a referral to orthopedics to ensure alleviation of your symptoms.  If you notice continued difficulty moving the arm, loss of sensation down your arm, or feelings as if your arm is dislocated please do not hesitate to return to the ED.  At this point, you are stable for discharge I hope you continue to feel better.

## 2021-04-01 NOTE — Telephone Encounter (Signed)
He does need to see ortho. It looks like ED put in an ortho referral. Any issues with that referral?

## 2021-04-01 NOTE — Telephone Encounter (Signed)
Does pt need an appt here or can he be referred to Ortho? toconnor lpn

## 2021-04-02 ENCOUNTER — Encounter: Payer: Self-pay | Admitting: Family Medicine

## 2021-04-07 ENCOUNTER — Encounter: Payer: Self-pay | Admitting: Family Medicine

## 2021-04-13 NOTE — Progress Notes (Signed)
Orthopaedic ED/Urgent Care Follow-up     Injury: Left shoulder injury   Injury Date: 04/01/21    Jorge Boyd presented to orthopedic clinic today for a follow up to emergency care. He has a PMH of a right THA in August 2021 in [place], kidney stones, bursitis of the right shoulder, and atrial fibrillation. On 3/1, he was seen at the ED for left shoulder pain. He states he was taking his dog out at 4am and did not have the lights on. The dog cut in front of him, causing him to fall forward towards a wall. He reached out with his left arm to catch himself and his left hand landed awkwardly on the wall. He states he did not make actual contact with his left shoulder to the ground or the wall. He felt pain in the left shoulder after this awkward landing. Initially he was unable to lift his arm out to the side past 45 degrees or hold any object up with that arm. His reduced range of motion and pain prompted him to go to the ED. X-rays done at the ED did not show an acute fracture or dislocation. The ED noticed decreased strength and ROM of the left shoulder. He did have mild diffuse muscular tenderness at the proximal humerus. He was advised to follow up with ortho, take OTC pain medication as needed, and perform gentle ROM.     Today he reports significant improvment since his injury two weeks ago. He is able to lift his arm above his head and hold objects with the arm, which he initially was unable to do. He has been working on range of motion daily at home. He injured his right shoulder a year ago and received a cortisone injection and did physical therapy so he is familiar with some of the exercises but he is interested in starting formal physical therapy. Currently, he only has pain in the back of his shoulder with certain movements, but he reports the pain is much improved. The pain does not radiate. He denies numbness or tingling. He was initially concerned he tore his rotator cuff and wanted to get an MRI,  but given how much he has progressed in the last couple weeks, he is no longer concerned about a tear or interested in the MRI. He has also been using some motrin and tylenol as needed as well as ice.     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 He also is a resident of [place], where he spends most of his time. He recently came back to [place] for multiple funerals. He usually spends the summers in [place] and the remainder of the year in [place], however, he will be staying in [place] as his dog is unable to travel back to [place] right now. He has an orthopedic surgeon here, Dr. Darrol Angel, who manages his hips. He is interested in getting established with a shoulder orthopedist as Dr. Darrol Angel does not manage shoulders and he has a history of having right shoulder issues as well.        Medications, allergies, and surgical histories: Reviewed and confirmed.  Past medical and family history: Reviewed and confirmed.   Social history: Reviewed and confirmed.    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       All ED and/or Urgent Care notes and imaging reviewed.  Review of  prior history was performed and noted as above.  A 12 point review of symptoms was performed and found to be otherwise negative.    Exam:     Vital Signs: BP 122/88 Comment: manual   Pulse 86    Ht 1.93 m (6\' 4" )    Wt 120.2 kg (265 lb)    BMI 32.26 kg/m      Constitutional: Alert and oriented, appears stated age  Neuro Psych: Affect normal, appropriate mood  Skin: There are no rashes or lesions noted.    Focused orthopaedic examination of the extremity    Comprehensive exam of the left upper extremity:  Observation: There is no evidence of swelling or deformity, there are no abrasions or bruises.   Palpation: There is mild focal tenderness to palpation of the posterior lateral aspect of the shoulder. No bony tenderness. No  tenderness along the joint line.   ROM: Active range of motion at the shoulder is full and symmetric without contractures, crepitus.   Strength:  Muscle strength is normal and symmetric to the contralateral side. Full strength with empty can test. Mild tenderness along the posterior lateral shoulder when empty can test performed.   Sensation: Sensation intact throughout distal extremity.   Circulation: Distal extremity warm with a brisk capillary refill throughout.      Imaging:  No new imaging done today.       Assessment/Plan:   61 year old male with left shoulder pain following an injury two weeks ago. Given his significant improvement in range of motion and strength over the past two weeks, unlikely that he has a rotator cuff tear. Will place an order for physical therapy and a referral to the shoulder department. Discussed with Dr. 41, who is willing to give the patient a cortisone injection next week while he waits to get an appointment with the shoulder department. In the meantime, he can continue to perform range of motion exercises as tolerated and start working with physical therapy. He can use OTC pain medications such as ibuprofen or tylenol as needed for pain. Patient is in agreement with this plan.       Follow up:  Return in 1 week for a cortisone injection with Dr. Verne Boyd.       Patient verbalized understanding of the above plan.     Jorge Grain, NP     The above document was generated using voice recognition software. Reasonable attempts at correction were made. Please excuse any unintended transcription errors    Answers for HPI/ROS submitted by the patient on 04/07/2021  What is your goal for today's visit?: MRI and then next steps to remidiate the Shoulder Is[place]  Handedness: Right Handed  Date of onset: : 04/01/2021  What is your pain level?: 3/10  Please describe the quality of your pain: : aching, clicking, instability, sharp  What diagnostic workup have you had for this condition?:  X-ray  What treatments have you tried for this condition?: acetaminophen  Progression since onset: : gradually improving  Is this a work related condition? : No  Current work status: : usual activities  Fever: No  Chills: No  Numbness: No  Tingling: No

## 2021-04-14 ENCOUNTER — Encounter: Payer: Self-pay | Admitting: Family Medicine

## 2021-04-14 ENCOUNTER — Other Ambulatory Visit: Payer: Self-pay

## 2021-04-14 ENCOUNTER — Ambulatory Visit: Payer: BLUE CROSS/BLUE SHIELD

## 2021-04-14 VITALS — BP 122/88 | HR 86 | Ht 76.0 in | Wt 265.0 lb

## 2021-04-14 DIAGNOSIS — M25512 Pain in left shoulder: Secondary | ICD-10-CM

## 2021-04-14 NOTE — Telephone Encounter (Signed)
Spoke to pt and he wanted you to take a look at his pictures of rash. He would like a sooner appt aslo. Val S lpn

## 2021-04-15 ENCOUNTER — Encounter: Payer: Self-pay | Admitting: Family Medicine

## 2021-04-15 NOTE — Telephone Encounter (Signed)
This patient attachment is clinically relevant.  Please keep in the patient's chart.    [] Document  [x] Photo    Brief attachment description: face rash  (Ex. L forearm rash, WC papers)    Thank you,  Kyndle Schlender R Arianna Haydon, MD

## 2021-04-20 ENCOUNTER — Other Ambulatory Visit: Payer: Self-pay

## 2021-04-20 ENCOUNTER — Ambulatory Visit: Payer: BLUE CROSS/BLUE SHIELD | Admitting: Primary Care

## 2021-04-20 ENCOUNTER — Encounter: Payer: Self-pay | Admitting: Primary Care

## 2021-04-20 ENCOUNTER — Other Ambulatory Visit: Payer: Self-pay | Admitting: Family Medicine

## 2021-04-20 VITALS — BP 126/88 | HR 88 | Temp 97.3°F | Ht 76.0 in | Wt 292.6 lb

## 2021-04-20 DIAGNOSIS — L719 Rosacea, unspecified: Secondary | ICD-10-CM

## 2021-04-20 MED ORDER — AZELAIC ACID 15 % EX GEL *A*
CUTANEOUS | 2 refills | Status: DC
Start: 2021-04-20 — End: 2021-09-10

## 2021-04-20 NOTE — Progress Notes (Signed)
[facility]  Pediatrics/Internal Medicine     Subjective   CC: Arney Mayabb is a 61 y.o. male who was brought in because of:   Chief Complaint   Patient presents with    Rash     X one month red rash on face, had telemedicine visit was prescribed steroid - with some improvement but not resolved -        HPI: Rash started about 1 1/2 mos ago. Tried changing products. [facility] put him on oral steroids for possible allergic reaction in Feb. Helped a little. Never itchy or painful. No eyelash involvement.  Washes with cetaphil. Lives 8 mos in [place] usually but been here since Oct.  Hot water makes worse. Has doctor in [place] also.     ROS: See HPI for pertinent ROS.      MEDICATIONS      Current Outpatient Medications   Medication    tamsulosin (FLOMAX) 0.4 mg capsule    hydroCHLOROthiazide (HYDRODIURIL) 25 mg tablet    tadalafil (CIALIS) 5 MG tablet    potassium citrate (UROCIT-K) 10 mEq (1080 mg) CR tablet    dilTIAZem (TIAZAC, DILTZAC, TAZTIA XT) 120 mg 24 hr capsule    allopurinol (ZYLOPRIM) 300 mg tablet    aspirin 81 mg EC tablet    Multiple Vitamin (MULTIVITAMIN PO)    azelaic acid (FINACEA) 15 % gel     No current facility-administered medications for this visit.       Medications reviewed and confirmed.  Allergies reviewed and confirmed.    Allergies   Allergen Reactions    Demerol Hcl [Meperidine] Nausea And Vomiting     Problem list reviewed.     Objective   Physical Exam:  Vitals: BP 126/88    Pulse 88    Temp 36.3 C (97.3 F)    Ht 1.93 m (6\' 4" )    Wt 132.7 kg (292 lb 9.6 oz)    SpO2 96%    BMI 35.62 kg/m   Facility age limit for growth %iles is 20 years.  Facility age limit for growth %iles is 20 years.  Facility age limit for growth %iles is 20 years.    General:  Alert, NAD  Skin  c/w rosacea- somewhat symmetric erythema of cheeks, nose, forehead with papules and and telangiectasias and no comedomes. Eyelashes clear.     Assessment   Zyrell Carmean is a 61 y.o. male  with   1. Rosacea            Consistent with rosacea. No phymatous changes. Chronic inflammation and hyperplasia of the pilosebaceous units. Telangiectasias and erythema. Will treat with topical meds. Finacea. He has good coverage. Avoid further oral steroids. He read about [facility] but would defer to now unless fails traditional Rx.  Missed BP f/up as not in 41- will do before returns in fall. BP fine today. HAd labs in Dec.     Plan   Follow up for with PCP re BP before Sept.     There are no Patient Instructions on file for this visit.    Luvern Mischke ANN 11-20-1974, MD 9:59 AM 04/20/2021

## 2021-04-20 NOTE — Progress Notes (Signed)
Orthopaedic ED/Urgent Care Follow-up     Injury: Left shoulder injury   Injury Date: 04/01/21     Jorge Boyd presented to orthopedic clinic today for a follow up to emergency care. He was seen last week and reports he was doing great after that visit as he was working out and [employer], but on Saturday he overdid it by swinging the bat while practicing for softball. He has been using tylenol and motrin as needed. He also ices the shoulder multiple times a day. He is working on range of motion exercises that he learned when he injured his right shoulder last year. For his right shoulder injury, he received a cortisone injection, which helped him rehab his shoulder so he could return to softball. He is interested in receiving a cortisone injection for his left shoulder today.       Medications, allergies, and surgical histories: Reviewed and confirmed.  Past medical and family history: Reviewed and confirmed.   Social history: Reviewed and confirmed.        All ED and/or Urgent Care notes and imaging reviewed.  Review of prior history was performed and noted as above.  A 12 point review of symptoms was performed and found to be otherwise negative.    Exam:     Vital Signs: BP 137/85    Pulse 87      Constitutional: Alert and oriented, appears stated age  Neuro Psych: Affect normal, appropriate mood    Focused orthopaedic examination of the extremity    Comprehensive exam of the left upper extremity:  Observation: There is no evidence of swelling or deformity, there are no abrasions or bruises.   Palpation: There is minimal focal tenderness to palpation over the posterior lateral aspect of the shoulder.   ROM: Active range of motion at the shoulder is full and symmetric without contractures, crepitus.   Strength:  Muscle strength is normal and symmetric to the contralateral side.   Sensation: Sensation is intact throughout distal extremity.  Circulation: Distal extremity warm with a brisk capillary refill  throughout.       Imaging:  No new imaging done today       Assessment/Plan:    61 year old male with left shoulder pain. Patient should continue to work on range of motion exercises but advised he avoid swinging a bat for the next couple weeks. He can use OTC pain medications as needed as well as ice. Advised he ice his shoulder this evening as the cortisone injection can initially worsen his shoulder pain. He was given a cortisone injection today by Dr. Verne Grain. See Dr. Bertis Ruddy procedural note for further information.       Follow up:  As needed       Patient verbalized understanding of the above plan.     [name], NP     The above document was generated using voice recognition software. Reasonable attempts at correction were made. Please excuse any unintended transcription errors    Answers for HPI/ROS submitted by the patient on 04/14/2021  What is your goal for today's visit?: Cortisone injection  Handedness: Right Handed  Date of onset: : 04/01/2021  Was this the result of an injury?: Yes  What is your pain level?: 3/10  Please describe the quality of your pain: : aching, discomfort, sharp, tenderness  What diagnostic workup have you had for this condition?: X-ray  What treatments have you tried for this condition?: acetaminophen, activity modification  Progression since onset: :  rapidly improving  Is this a work related condition? : No  Current work status: : usual activities  Fever: No  Chills: No  Numbness: No  Tingling: No

## 2021-04-20 NOTE — Telephone Encounter (Signed)
Last office visit:   04/20/2021  Last telemedicine visit:  01/08/2021  Last PA office visit:  Visit date not found  Patients upcoming appointments:  Future Appointments   Date Time Provider [place]   04/21/2021  4:00 PM Charlott Holler, NP [facility] None   06/26/2021  9:30 AM Shelia Media, MD [facility] None   09/29/2021 10:45 AM Shelia Media, MD [facility] None     Recent Lab results:  GENERAL CHEMISTRY   Recent Labs     01/06/21  0822 10/03/20  1013   NA 142 138   K 4.3 4.2   CL 100 101   CO2 28 25   GAP 14 12   UN 18 22*   CREAT 1.13 1.15   GLU 111* 103*   CA 9.9 9.9   URIC  --  6.7      LIPID PROFILE   Recent Labs     10/03/20  1013   CHOL 199   TRIG 114   HDL 38*   LDLC 138*      LIVER PROFILE   Recent Labs     01/06/21  0822 10/03/20  1013   ALT 27 26   AST 20 24   ALK 112 96   TB 0.7 0.9      DIABETES THYROID   Recent Labs     10/03/20  1013   HA1C 5.5    No value within the past 365 days      Pending/Orders Labs:  Lab Frequency Next Occurrence   COVID-19 PCR Once 11/24/2020

## 2021-04-21 ENCOUNTER — Ambulatory Visit: Payer: BLUE CROSS/BLUE SHIELD | Admitting: Orthopedic Surgery

## 2021-04-21 VITALS — BP 137/85 | HR 87

## 2021-04-21 DIAGNOSIS — M25512 Pain in left shoulder: Secondary | ICD-10-CM

## 2021-04-21 MED ORDER — HYDROCHLOROTHIAZIDE 25 MG PO TABS *I*
25.0000 mg | ORAL_TABLET | Freq: Every morning | ORAL | 1 refills | Status: DC
Start: 2021-04-21 — End: 2021-07-27

## 2021-04-24 ENCOUNTER — Other Ambulatory Visit: Payer: Self-pay | Admitting: Family Medicine

## 2021-04-24 NOTE — Telephone Encounter (Signed)
Last office visit:   04/20/2021  Last telemedicine visit:  01/08/2021  Last PA office visit:  Visit date not found  Patients upcoming appointments:  Future Appointments   Date Time Provider [place]   06/26/2021  9:30 AM Shelia Media, MD [facility] None   09/29/2021 10:45 AM Shelia Media, MD [facility] None     Recent Lab results:  GENERAL CHEMISTRY   Recent Labs     01/06/21  0822 10/03/20  1013   NA 142 138   K 4.3 4.2   CL 100 101   CO2 28 25   GAP 14 12   UN 18 22*   CREAT 1.13 1.15   GLU 111* 103*   CA 9.9 9.9   URIC  --  6.7      LIPID PROFILE   Recent Labs     10/03/20  1013   CHOL 199   TRIG 114   HDL 38*   LDLC 138*      LIVER PROFILE   Recent Labs     01/06/21  0822 10/03/20  1013   ALT 27 26   AST 20 24   ALK 112 96   TB 0.7 0.9      DIABETES THYROID   Recent Labs     10/03/20  1013   HA1C 5.5    No value within the past 365 days      Pending/Orders Labs:  Lab Frequency Next Occurrence   COVID-19 PCR Once 11/24/2020

## 2021-04-28 ENCOUNTER — Ambulatory Visit: Payer: BLUE CROSS/BLUE SHIELD

## 2021-05-06 ENCOUNTER — Encounter: Payer: Self-pay | Admitting: Family Medicine

## 2021-05-06 DIAGNOSIS — Z87442 Personal history of urinary calculi: Secondary | ICD-10-CM

## 2021-05-08 ENCOUNTER — Other Ambulatory Visit
Admission: RE | Admit: 2021-05-08 | Discharge: 2021-05-08 | Disposition: A | Discharge status: Home / Self Care | Payer: BLUE CROSS/BLUE SHIELD | Source: Ambulatory Visit | Attending: Family Medicine | Admitting: Family Medicine

## 2021-05-08 DIAGNOSIS — Z87442 Personal history of urinary calculi: Secondary | ICD-10-CM

## 2021-05-08 LAB — URINALYSIS WITH MICROSCOPIC
Bacteria,UA: NONE SEEN
Blood,UA: NEGATIVE
Glucose,UA: NEGATIVE
Hyaline Casts,UA: NONE SEEN /lpf (ref 0–5)
Ketones, UA: NEGATIVE
Nitrite,UA: NEGATIVE
Protein,UA: NEGATIVE
Specific Gravity,UA: 1.024 (ref 1.002–1.030)
Squam Epithel,UA: NONE SEEN /lpf (ref 0–?)
pH,UA: 7 (ref 5.0–8.0)

## 2021-05-09 LAB — AEROBIC CULTURE: Aerobic Culture: 0

## 2021-05-13 ENCOUNTER — Other Ambulatory Visit: Payer: Self-pay | Admitting: Family Medicine

## 2021-05-13 ENCOUNTER — Encounter: Payer: Self-pay | Admitting: Family Medicine

## 2021-05-13 NOTE — Telephone Encounter (Signed)
Last office visit:   04/20/2021  Last telemedicine visit:  01/08/2021  Last PA office visit:  Visit date not found  Patients upcoming appointments:  Future Appointments   Date Time Provider Department Center   06/26/2021  9:30 AM Schwartz, Carla R, MD [facility] None   09/29/2021 10:45 AM Schwartz, Carla R, MD [facility] None     Recent Lab results:  GENERAL CHEMISTRY   Recent Labs     01/06/21  0822 10/03/20  1013   NA 142 138   K 4.3 4.2   CL 100 101   CO2 28 25   GAP 14 12   UN 18 22*   CREAT 1.13 1.15   GLU 111* 103*   CA 9.9 9.9   URIC  --  6.7      LIPID PROFILE   Recent Labs     10/03/20  1013   CHOL 199   TRIG 114   HDL 38*   LDLC 138*      LIVER PROFILE   Recent Labs     01/06/21  0822 10/03/20  1013   ALT 27 26   AST 20 24   ALK 112 96   TB 0.7 0.9      DIABETES THYROID   Recent Labs     10/03/20  1013   HA1C 5.5    No value within the past 365 days      Pending/Orders Labs:  Lab Frequency Next Occurrence   COVID-19 PCR Once 11/24/2020

## 2021-05-15 MED ORDER — TAMSULOSIN HCL 0.4 MG PO CAPS *I*
0.8000 mg | ORAL_CAPSULE | Freq: Every day | ORAL | 1 refills | Status: DC
Start: 2021-05-15 — End: 2021-05-19

## 2021-05-15 NOTE — Telephone Encounter (Signed)
Last office visit:   04/20/2021  Last telemedicine visit:  01/08/2021  Last PA office visit:  Visit date not found  Patients upcoming appointments:  Future Appointments   Date Time Provider Department Center   06/26/2021  9:30 AM Schwartz, Carla R, MD [facility] None   09/29/2021 10:45 AM Schwartz, Carla R, MD [facility] None     Recent Lab results:  GENERAL CHEMISTRY   Recent Labs     01/06/21  0822 10/03/20  1013   NA 142 138   K 4.3 4.2   CL 100 101   CO2 28 25   GAP 14 12   UN 18 22*   CREAT 1.13 1.15   GLU 111* 103*   CA 9.9 9.9   URIC  --  6.7      LIPID PROFILE   Recent Labs     10/03/20  1013   CHOL 199   TRIG 114   HDL 38*   LDLC 138*      LIVER PROFILE   Recent Labs     01/06/21  0822 10/03/20  1013   ALT 27 26   AST 20 24   ALK 112 96   TB 0.7 0.9      DIABETES THYROID   Recent Labs     10/03/20  1013   HA1C 5.5    No value within the past 365 days      Pending/Orders Labs:  Lab Frequency Next Occurrence   COVID-19 PCR Once 11/24/2020

## 2021-05-18 ENCOUNTER — Encounter: Payer: Self-pay | Admitting: Family Medicine

## 2021-05-18 NOTE — Telephone Encounter (Signed)
Prescripiton sent 05/15/21

## 2021-05-19 ENCOUNTER — Other Ambulatory Visit: Payer: Self-pay

## 2021-05-19 ENCOUNTER — Encounter: Payer: Self-pay | Admitting: Family Medicine

## 2021-05-19 ENCOUNTER — Ambulatory Visit: Payer: BLUE CROSS/BLUE SHIELD | Attending: Family Medicine | Admitting: Family Medicine

## 2021-05-19 ENCOUNTER — Other Ambulatory Visit
Admission: RE | Admit: 2021-05-19 | Discharge: 2021-05-19 | Disposition: A | Discharge status: Home / Self Care | Payer: BLUE CROSS/BLUE SHIELD | Source: Ambulatory Visit | Attending: Family Medicine | Admitting: Family Medicine

## 2021-05-19 VITALS — BP 112/70 | HR 71 | Temp 97.4°F | Wt 282.0 lb

## 2021-05-19 DIAGNOSIS — R509 Fever, unspecified: Secondary | ICD-10-CM | POA: Insufficient documentation

## 2021-05-19 DIAGNOSIS — N400 Enlarged prostate without lower urinary tract symptoms: Secondary | ICD-10-CM | POA: Insufficient documentation

## 2021-05-19 DIAGNOSIS — Z8739 Personal history of other diseases of the musculoskeletal system and connective tissue: Secondary | ICD-10-CM | POA: Insufficient documentation

## 2021-05-19 DIAGNOSIS — Z0000 Encounter for general adult medical examination without abnormal findings: Secondary | ICD-10-CM | POA: Insufficient documentation

## 2021-05-19 LAB — CBC AND DIFFERENTIAL
Baso # K/uL: 0.1 10*3/uL (ref 0.0–0.1)
Basophil %: 1 %
Eos # K/uL: 0.2 10*3/uL (ref 0.0–0.5)
Eosinophil %: 3.4 %
Hematocrit: 47 % (ref 40–51)
Hemoglobin: 15.9 g/dL (ref 13.7–17.5)
IMM Granulocytes #: 0 10*3/uL (ref 0.0–0.0)
IMM Granulocytes: 0.2 %
Lymph # K/uL: 1.3 10*3/uL (ref 1.3–3.6)
Lymphocyte %: 26.8 %
MCH: 31 pg (ref 26–32)
MCHC: 34 g/dL (ref 32–37)
MCV: 93 fL — ABNORMAL HIGH (ref 79–92)
Mono # K/uL: 0.6 10*3/uL (ref 0.3–0.8)
Monocyte %: 13 %
Neut # K/uL: 2.7 10*3/uL (ref 1.8–5.4)
Nucl RBC # K/uL: 0 10*3/uL (ref 0.0–0.0)
Nucl RBC %: 0 /100 WBC (ref 0.0–0.2)
Platelets: 227 10*3/uL (ref 150–330)
RBC: 5.1 MIL/uL (ref 4.6–6.1)
RDW: 14 % (ref 11.6–14.4)
Seg Neut %: 55.6 %
WBC: 4.9 10*3/uL (ref 4.2–9.1)

## 2021-05-19 LAB — URINALYSIS WITH REFLEX TO MICROSCOPIC
Blood,UA: NEGATIVE
Glucose,UA: NEGATIVE
Ketones, UA: NEGATIVE
Nitrite,UA: NEGATIVE
Specific Gravity,UA: 1.021 (ref 1.002–1.030)
pH,UA: 7.5 (ref 5.0–8.0)

## 2021-05-19 LAB — COMPREHENSIVE METABOLIC PANEL
ALT: 28 U/L (ref 0–50)
AST: 25 U/L (ref 0–50)
Albumin: 4.7 g/dL (ref 3.5–5.2)
Alk Phos: 100 U/L (ref 40–130)
Anion Gap: 10 (ref 7–16)
Bilirubin,Total: 0.8 mg/dL (ref 0.0–1.2)
CO2: 28 mmol/L (ref 20–28)
Calcium: 9.8 mg/dL (ref 8.6–10.2)
Chloride: 101 mmol/L (ref 96–108)
Creatinine: 1.14 mg/dL (ref 0.67–1.17)
Glucose: 108 mg/dL — ABNORMAL HIGH (ref 60–99)
Lab: 21 mg/dL — ABNORMAL HIGH (ref 6–20)
Potassium: 4.5 mmol/L (ref 3.3–5.1)
Sodium: 139 mmol/L (ref 133–145)
Total Protein: 6.5 g/dL (ref 6.3–7.7)
eGFR BY CREAT: 74 *

## 2021-05-19 LAB — LIPID PANEL
Chol/HDL Ratio: 5
Cholesterol: 154 mg/dL
HDL: 31 mg/dL — ABNORMAL LOW (ref 40–60)
LDL Calculated: 101 mg/dL
Non HDL Cholesterol: 123 mg/dL
Triglycerides: 112 mg/dL

## 2021-05-19 LAB — CRP: CRP: 21 mg/L — ABNORMAL HIGH (ref 0–8)

## 2021-05-19 LAB — PSA (EFF.4-2010): PSA (eff. 4-2010): 2.82 ng/mL (ref 0.00–4.00)

## 2021-05-19 LAB — URINE MICROSCOPIC (IQ200)
Bacteria,UA: NONE SEEN
Hyaline Casts,UA: NONE SEEN /lpf (ref 0–5)

## 2021-05-19 LAB — SEDIMENTATION RATE, AUTOMATED: Sedimentation Rate: 7 mm/hr (ref 0–20)

## 2021-05-19 LAB — TSH: TSH: 2.81 u[IU]/mL (ref 0.27–4.20)

## 2021-05-19 LAB — URIC ACID: Urate: 6.1 mg/dL (ref 3.9–9.0)

## 2021-05-19 MED ORDER — ALLOPURINOL 300 MG PO TABS *I*
300.0000 mg | ORAL_TABLET | Freq: Every day | ORAL | 1 refills | Status: DC
Start: 2021-05-19 — End: 2021-10-16

## 2021-05-19 MED ORDER — TAMSULOSIN HCL 0.4 MG PO CAPS *I*
0.8000 mg | ORAL_CAPSULE | Freq: Every day | ORAL | 1 refills | Status: DC
Start: 2021-05-19 — End: 2021-09-29

## 2021-05-19 NOTE — Progress Notes (Signed)
Chief complaint:  Fever     HPI: Jorge Boyd is a 61 y.o. man with a history of atrial fibrillation, kidney stones, pyelonephritis, and osteoarthritis, being evaluated for fever.      05/08/21 UA and culture negative.   In Dec 2022, was treated for ESBL E coli pyelonephritis.       Today patient reports -   Mostly feeling great.   Is rehabing left dislocated shoulder.   Weight down 11 lbs intentionally.  Normal home BP checks.     For the last 2 weeks, intermittently feels like he has a fever - temp 96 or 97. Then a few hours later, 99.8-100. Occurs about every 3 days.   Will feel run down at the time, fatigued.   Highest temp was 100.9.    Yesterday - temp 100.     In the past, when he took a lower dose of tamsulosin, he had a urine infection.   History of kidney stones.   Last week, he did decrease tamsulosin to 0.4 mg because of rx issue/running low.   Hasn't taken any tamsulosin for the last 3-4 days.     Sometimes feels tired with minimal exertion/bending over.   Will do weight lifting, high intensity bursts on treadmill, 60 minutes on the elliptical - no chest pain, shortness of breath.   No dizziness or lightheadedness with standing up.     Had some mild back pain with fever around 4/7.   No dysuria, no hematuria.   No N/V, abdominal pain.   No rectal pain.     +nasal congestion in the mornings.  Chronic since being back in [place]. Clears up within a couple hours.  No cough.   No sore throat, ear pain.   No diarrhea.   No palpitations.   Often looks at [facility] - no evidence of afib.   No recent sick contacts.   +hands and feet feel freezing cold, and head feels warm -intermittent.   +fatigue -has been going to bed much earlier  No changes in hair/skin/nails.     Right now, feels great/normal.     Medications:   Current Outpatient Medications   Medication Sig   ? tamsulosin (FLOMAX) 0.4 mg capsule Take 2 capsules (0.8 mg total) by mouth daily   ? tadalafil (CIALIS) 5 MG tablet TAKE 1 TABLET BY MOUTH EVERY DAY    ? hydroCHLOROthiazide (HYDRODIURIL) 25 mg tablet Take 1 tablet (25 mg total) by mouth every morning   ? azelaic acid (FINACEA) 15 % gel After skin is washed and patted dry, gently but thoroughly massage a thin film into the affected area twice daily, in the AM and PM   ? potassium citrate (UROCIT-K) 10 mEq (1080 mg) CR tablet Take 1 tablet (10 mEq total) by mouth daily   ? dilTIAZem (TIAZAC, DILTZAC, TAZTIA XT) 120 mg 24 hr capsule diltiazem CD 120 mg capsule,extended release 24 hr   ? allopurinol (ZYLOPRIM) 300 mg tablet    ? aspirin 81 mg EC tablet Take 1 tablet (81 mg total) by mouth daily   ? Multiple Vitamin (MULTIVITAMIN PO) Take by mouth     No current facility-administered medications for this visit.     Allergies:   Allergies   Allergen Reactions   ? Demerol Hcl [Meperidine] Nausea And Vomiting       Past medical history, problem list, medications and allergies personally reviewed in eRecord today.     ROS: per HPI     BP  112/70 (BP Location: Left arm, Cuff Size: large adult)   Pulse 71   Temp 36.3 ?C (97.4 ?F) (Temporal)   Wt 127.9 kg (282 lb)   SpO2 98%   BMI 34.33 kg/m?   Physical Exam  Constitutional:       Appearance: Normal appearance.   HENT:      Head: Normocephalic.      Right Ear: Tympanic membrane, ear canal and external ear normal.      Left Ear: Tympanic membrane, ear canal and external ear normal.      Mouth/Throat:      Mouth: Mucous membranes are moist.      Pharynx: Oropharynx is clear. No oropharyngeal exudate.   Eyes:      General: No scleral icterus.        Right eye: No discharge.         Left eye: No discharge.      Conjunctiva/sclera: Conjunctivae normal.      Pupils: Pupils are equal, round, and reactive to light.   Neck:      Thyroid: No thyromegaly.   Cardiovascular:      Rate and Rhythm: Normal rate and regular rhythm.      Heart sounds: Normal heart sounds. No murmur heard.  Pulmonary:      Effort: Pulmonary effort is normal. No respiratory distress.      Breath sounds:  Normal breath sounds.   Abdominal:      General: Bowel sounds are normal. There is no distension.      Palpations: Abdomen is soft. There is no mass.      Tenderness: There is no abdominal tenderness. There is no guarding or rebound.   Musculoskeletal:      Cervical back: Neck supple.      Right lower leg: No edema.      Left lower leg: No edema.   Lymphadenopathy:      Cervical: No cervical adenopathy.   Skin:     General: Skin is warm and dry.   Neurological:      Mental Status: He is alert and oriented to person, place, and time.      Gait: Gait is intact.   Psychiatric:         Mood and Affect: Affect normal.       Assessment/Plan:    1. Fever  Jorge Boyd is here today for evaluation of intermittent fevers, ongoing for the last 2 weeks.  Tmax of 100.9.  Fevers will last for only a few hours.  Associated with a feeling of fatigue/feeling rundown.  Only other symptom is some morning nasal congestion, and hands and feet feeling cold at times.  Overall, he is feeling great.  He is working with a Psychologist, educational and exercising at a high level.  Vitals and exam today normal as above.  05/08/21 UA and culture were negative.  We will recheck UA and culture today, given history of pyelonephritis.  Will also evaluate for possible infection with CBC differential, CRP, ESR.  Will check TSH and CMP as well given symptoms of fatigue.  I did consider episodes of atrial fibrillation, but Jorge Boyd generally always wears his smart watch, and he has not seen any episodes of A-fib.    2. History of gout  Continues allopurinol 300 mg daily.  Patient will confirm his dose at home.  - Uric acid; Future    3. Benign prostatic hyperplasia  Continues tamsulosin 0.8 mg nightly.  - PSA (eff.05-2008); Future  4. Health care maintenance  - Lipid Panel (Reflex to Direct  LDL if Triglycerides more than 400); Future        Patient Instructions   Double check that you have been taking allopurinol 100 mg tabs, 3 tabs daily.         Follow-up: As needed

## 2021-05-19 NOTE — Patient Instructions (Signed)
Double check that you have been taking allopurinol 100 mg tabs, 3 tabs daily.

## 2021-05-20 ENCOUNTER — Encounter: Payer: Self-pay | Admitting: Family Medicine

## 2021-05-20 LAB — AEROBIC CULTURE: Aerobic Culture: 0

## 2021-05-31 ENCOUNTER — Encounter: Payer: Self-pay | Admitting: Orthopedic Surgery

## 2021-06-02 NOTE — Progress Notes (Signed)
Pre-Visit Planning    Health Maintenance Due   Topic Date Due   . COVID-19 Vaccine (1) Never done   . HIV Screening [facility]PSTF/[place]  Never done   . Hepatitis C Screening [facility]PSTF/[place]  Never done   . IMM DTaP/Tdap/Td (1 - Tdap) Never done   . Colon Cancer Screening USPSTF  Never done   . IMM-ZOSTER (1 of 2) Never done

## 2021-06-04 ENCOUNTER — Ambulatory Visit
Payer: BLUE CROSS/BLUE SHIELD | Attending: Physical Medicine and Rehabilitation | Admitting: Physical Medicine and Rehabilitation

## 2021-06-04 ENCOUNTER — Encounter: Payer: Self-pay | Admitting: Physical Medicine and Rehabilitation

## 2021-06-04 ENCOUNTER — Other Ambulatory Visit: Payer: Self-pay

## 2021-06-04 VITALS — BP 124/78 | HR 81 | Temp 98.8°F | Ht 76.0 in | Wt 282.0 lb

## 2021-06-04 DIAGNOSIS — M1612 Unilateral primary osteoarthritis, left hip: Secondary | ICD-10-CM | POA: Insufficient documentation

## 2021-06-04 MED ORDER — LIDOCAINE HCL 1 % IJ SOLN *I*
4.0000 mL | Freq: Once | INTRAMUSCULAR | Status: AC | PRN
Start: 2021-06-04 — End: 2021-06-04
  Administered 2021-06-04: 4 mL via INTRA_ARTICULAR

## 2021-06-04 MED ORDER — TRIAMCINOLONE ACETONIDE 40 MG/ML IJ SUSP *I*
40.0000 mg | Freq: Once | INTRAMUSCULAR | Status: AC | PRN
Start: 2021-06-04 — End: 2021-06-04
  Administered 2021-06-04: 40 mg via INTRA_ARTICULAR

## 2021-06-04 NOTE — Patient Instructions (Signed)
Physical Medicine and Rehabilitation - Post Injection Instructions    You received a steroid (cortisone) injection.     Your provider's office number is [phone].    When will I start to experience pain relief?  You may get some immediate relief if numbing medicine (lidocaine/bupivicaine) is used, but this wears off in several hours.  The therapeutic effect from the steroid may take up to 1-2 weeks.    What are the possible side effects?  The most common side effect from injection procedures is a transient increase in pain for the first 24-72 hours. Possible minor, temporary, and treatable side effects include post-injection muscle soreness, localized bruising, swelling or redness, temporary lightheadedness, dizziness or fainting, temporary increased blood sugar or blood pressure. These side effects typically resolve on their own within 1-2 days. Rare (<1%) but more severe risks include bleeding, allergic reaction, or infection.  What should I do after my injection?  You may ice the injection site for local discomfort. Ice should be covered with a cloth - never place directly on skin. Apply for 15 minutes on, then 1 hour off. Repeat as needed.  Avoid soaking the injection site in water for at least 24 hours (no baths, hot tubs, or pools) - showering is OK.  Avoid strenuous activity in the injected body part for 2 days after injection.    For what reasons should I contact my physician after an injection?  A temperature of greater than 100F that is not improving  Pain that is severe and worsening  Severe redness, swelling or drainage from the injection site  In case of a question or concern between 8 AM and 4:30 PM Monday through Friday, reach out through MyChart (a response through MyChart is often faster) or call your doctor's office at [phone].  Seek urgent medical care if staff is unavailable.   For urgent issues before 8 AM or after 4:30 PM, on weekends or holidays, please contact the on-call physician  at [phone].    Billing information for injection procedures  We want you well-informed about billing and insurance issues related to your care. Please read the following information carefully. If you have questions or concerns, our office would be happy to discuss them with you.  You will receive two bills, one from the physician performing the injection and one from [facility] for the use of the room and medical supplies. The physician bill will come from [facility] and will read "Statement of Professional Services". The hospital bill will come from  Hospital. Many insurance companies will require a separate co-payment/co-insurance for each bill. If you have questions regarding your bill, please reach out to the billing office at [phone].  We urge you to become familiar, in advance, with what your insurance policy covers, as well as any co-payment and deductibles. We are glad to assist you and answer questions, but payment and insurance coverage are ultimately your responsibility.

## 2021-06-04 NOTE — Procedures (Signed)
Left intra-articular hip joint ultrasound guided injection     A brief sonographic assessment of the region showed a small joint effusion. A 22 gauge 3.5 inch needle was introduced and advanced with ultrasound guidance from distal to proximal to the femoral head-neck junction, where a mixture of local anesthetic and corticosteroid was injected.     Daryel Gerald DO  Physical Medicine and Rehabilitation        Large Joint Aspiration/Injection Procedure: L hip joint    Date/Time: 06/04/2021  9:10 AM EDT  Consent given by: patient (Risks, benefits, and alternatives to injection were discussed, including the possibility of pain, bleeding, infection, color change of skin, systemic reaction and lack of clinical improvement. They verbalized understanding.)  Site marked: site marked  Timeout: Immediately prior to procedure a time out was called to verify the correct patient, procedure, equipment, support staff and site/side marked as required     Procedure Details    Location: hip - L hip joint  Preparation: The site was prepped using the usual aseptic technique.  Ultrasound guidance:  Ultrasound was utilized to improve needle visualization, injection accuracy, and anatomic localization.   (Ultrasound was used prior to injection to identify target and any adjacent neurovascular structures, during injection for real-time needle visualization, and after to confirm injectate flow and hemostasis.)  Anesthetics administered: 4 mL lidocaine HCL 1 %  Intra-Articular Steroids administered: 40 mg triamcinolone acetonide 40 MG/ML  Dressing:  A dry, sterile dressing was applied.  Patient tolerance: patient tolerated the procedure well with no immediate complications

## 2021-06-04 NOTE — Progress Notes (Signed)
Physical Medicine and Rehabilitation Note 06/04/2021     Chief Complaint: Jorge Boyd presents today for follow up of hip pain.  Subjective     History of Present Illness 06/04/2021:  Pain description today: Hip,   2/10. Intermittent Aching, Sharp     Previously recommended home exercise program and injection.  Last injection: 07/31/2020 - left ultrasound guided intra-articular hip - 70% relief for 9 months  He was able to do more physically (play competitive softball) after the injection.    He was planning for total hip arthroplasty in November, but then he got a kidney infection and was concerned about that; has a history of sepsis from UTI. He may consider the surgery in the fall if his health is doing well.    Pain interferes with sleep.   Current level of function: lift weights  Desired level of function: play competitive softball     Summary:  executive leader, Research scientist (medical), plays competitive softball  Left groin, lateral and other (down to the knee) hip pain began years ago  Was playing softball, noticed more hip pain, kept playing, pain worsened.       Treatments tried for the hip:     Home Exercise Program: no relief    Physical Therapy: not tried    Heat: not tried    Ice: no relief    Assistive device: not tried    Topicals: no relief    Acetaminophen: no relief    NSAIDs: no relief    Injections:       07/31/2020 - left [place] intra-articular hip - 70% relief for ~9 months     Surgery: history of right THA      Objective    Physical Exam:   He sits comfortably and rises easily.  Vitals: BP 124/78   Pulse 81   Temp 37.1 C (98.8 F)   Ht 1.93 m (6\' 4" )   Wt 127.9 kg (282 lb)   BMI 34.33 kg/m            Assessment    1. Primary osteoarthritis of left hip        The presenting problem is chronic, with progression/exacerbation    Plan    - Continue home exercise program  - Over-the-counter medications as needed  - Ultrasound-guided left intra-articular hip steroid injection today. Risks, benefits and  alternatives were discussed.    The above documented evaluation and management was performed separately and in addition to the procedure.      Follow up if symptoms worsen or fail to improve.    Daryel Gerald DO  Physical Medicine and Rehabilitation     Please excuse any grammatical errors, this note was partially typed/dictated at point of care.

## 2021-06-11 ENCOUNTER — Encounter: Payer: Self-pay | Admitting: Family Medicine

## 2021-06-17 ENCOUNTER — Encounter: Payer: Self-pay | Admitting: Family

## 2021-06-17 ENCOUNTER — Ambulatory Visit: Payer: BLUE CROSS/BLUE SHIELD | Admitting: Family

## 2021-06-17 DIAGNOSIS — B9689 Other specified bacterial agents as the cause of diseases classified elsewhere: Secondary | ICD-10-CM

## 2021-06-17 DIAGNOSIS — J019 Acute sinusitis, unspecified: Secondary | ICD-10-CM

## 2021-06-17 MED ORDER — AMOXICILLIN-POT CLAVULANATE 875-125 MG PO TABS *I*
1.0000 | ORAL_TABLET | Freq: Two times a day (BID) | ORAL | 0 refills | Status: AC
Start: 2021-06-17 — End: 2021-06-27

## 2021-06-17 NOTE — Progress Notes (Signed)
TeleHome Video Encounter   Location of Patient: Home  Location of Telemedicine Provider: Home   Visit date: 06/17/2021  Consent was obtained from the patient to complete this video visit; including the potential for financial liability.     Subjective   Jorge Boyd,[birth year],  is on for a video visit for illness for 2 weeks.       Patient states that almost 2 weeks ago he developed an upper respiratory infection. He has had sinus congestion, nasal congestion, cough, sneezing, runny nose for over 10 days. He had a day where he was feeling great but then suddenly the symptoms returned and some congestion dropped to his chest. He has been coughing up brown colored sputum and has brownish mucus. His wife states that he has also been snoring now which he typically doesn't. He has had headaches almost daily along with facial pain and pressure. He has been taking OTC Mucinex, Robitussin, and sudafed. He tested for COVID and his test was negative. He does still have an intermittent fever of 99-100.9. He denies SOB, CP.     Medications, Allergies and PMH reviewed.     ROS:  A review of systems was preformed and the pertinent positives/negatives were noted above in the HPI.      Objective     VITALS: deferred due to telemedicine visit  General: Well appearing, cooperative, appropriately groomed, alert and oriented, and in no acute distress.  Voice: Hoarse, audible nasal congestion  Respiratory: Speaking easily, without evidence of shortness of breath. No cough, or evident cyanosis.  Skin: No rashes on visible skin  Neuro: No facial droop. No word finding difficulties.      Assessment      Acute Bacterial Sinusitis      Plan     -Given length of illness and HPI, I suspect patient has a bacterial infection.  -Antibiotic - Augmentin - sent - Please start this today. Reviewed risks and benefits. Take with food and along with pre/pro-biotic and fermented foods to keep gut healthy.  -Stay hydrated, take daily  vitamins.  -Cool mist humidifier may help break up sinus and nasal congestion as well.   -Sinus massage can help loosen up congestion. There is a good video on this on youtube. It is  "MASSAGE HERE FOR SINUS DRAINAGE & UNCLOG STUFFY NOSE - Dr Gus Puma"  -Decongestants.  Please be sure that you are using products that contain pseudoephedrine.  This is available without a prescription but you do have to request it from the pharmacist.  Products on the store shelf do not contain this.  This medication seems to be more effective for decreasing mucous production in the sinuses.   I recommend taking this at least 1-2 times daily, but you can use every 6 hours if you find this helpful.    -Guaifenesin (Mucinex).  This is an expectorant and is available over the counter.  Take this per the package instructions (formulations vary) to help thin out the secretions and promote sinus drainage. I do recommend the Extended Release (ER) formula. You can take 600mg -1200mg  every 12 hours as needed.   -Ibuprofen.  In addition, if you are able to use NSAIDs, you can take ibuprofen 600 mg three times a day which can help with both pain and inflammation in your sinuses.    -Saline irrigation.  You can also try using a netti pot/saline sinus rinse which can be very helpful in reducing congestion and getting the sinus to open up  and drain.  These are available to purchase over the counter.   -Follow up.  If your symptoms do not begin to improve in the next 3-5 days, I recommend that you are evaluated in-person at your PCP Clinic, or at one of the [facility], so that they can further evaluate these symptoms.    -If you develop worsening pain, fevers, chest discomfort, problems with your vision, facial numbness, shortness of breath, call your primary care provider immediately or seek emergency care.      Follow up: Follow up if symptoms worsen or fail to improve.  Author:   Alexis Frock, NP    [facility]  Medicine Primary Care Digital Health Team  Note signed: 06/17/2021

## 2021-06-17 NOTE — Patient Instructions (Signed)
If you have any questions or concerns about today's visit, please contact the [facility] Digital [facility] Team at [phone]

## 2021-06-18 ENCOUNTER — Encounter: Payer: Self-pay | Admitting: Family Medicine

## 2021-06-18 DIAGNOSIS — N390 Urinary tract infection, site not specified: Secondary | ICD-10-CM

## 2021-06-18 DIAGNOSIS — R300 Dysuria: Secondary | ICD-10-CM

## 2021-06-19 ENCOUNTER — Encounter: Payer: Self-pay | Admitting: Family Medicine

## 2021-06-19 ENCOUNTER — Other Ambulatory Visit
Admission: RE | Admit: 2021-06-19 | Discharge: 2021-06-19 | Disposition: A | Discharge status: Home / Self Care | Payer: BLUE CROSS/BLUE SHIELD | Source: Ambulatory Visit | Attending: Family Medicine | Admitting: Family Medicine

## 2021-06-19 DIAGNOSIS — R300 Dysuria: Secondary | ICD-10-CM | POA: Insufficient documentation

## 2021-06-19 DIAGNOSIS — N390 Urinary tract infection, site not specified: Secondary | ICD-10-CM | POA: Insufficient documentation

## 2021-06-19 LAB — URINALYSIS WITH MICROSCOPIC
Bacteria,UA: NONE SEEN
Blood,UA: NEGATIVE
Glucose,UA: NEGATIVE
Hyaline Casts,UA: NONE SEEN /lpf (ref 0–5)
Ketones, UA: NEGATIVE
Leuk Esterase,UA: NEGATIVE
Nitrite,UA: NEGATIVE
Protein,UA: NEGATIVE
RBC,UA: NONE SEEN /hpf (ref 0–2)
Specific Gravity,UA: 1.009 (ref 1.002–1.030)
Squam Epithel,UA: NONE SEEN /lpf (ref 0–?)
WBC,UA: NONE SEEN /hpf (ref 0–5)
pH,UA: 6.5 (ref 5.0–8.0)

## 2021-06-19 NOTE — Telephone Encounter (Signed)
Ok to order? Val

## 2021-06-20 LAB — AEROBIC CULTURE
Aerobic Culture: 0
Aerobic Culture: 0

## 2021-06-26 ENCOUNTER — Ambulatory Visit: Payer: BLUE CROSS/BLUE SHIELD | Admitting: Family Medicine

## 2021-06-26 ENCOUNTER — Encounter: Payer: Self-pay | Admitting: Family Medicine

## 2021-06-26 ENCOUNTER — Other Ambulatory Visit: Payer: Self-pay

## 2021-06-26 VITALS — BP 120/80 | HR 77 | Temp 97.5°F | Wt 285.0 lb

## 2021-06-26 DIAGNOSIS — Z1211 Encounter for screening for malignant neoplasm of colon: Secondary | ICD-10-CM

## 2021-06-26 DIAGNOSIS — I10 Essential (primary) hypertension: Secondary | ICD-10-CM

## 2021-06-26 DIAGNOSIS — N400 Enlarged prostate without lower urinary tract symptoms: Secondary | ICD-10-CM

## 2021-06-26 DIAGNOSIS — Z23 Encounter for immunization: Secondary | ICD-10-CM

## 2021-06-26 DIAGNOSIS — Z8739 Personal history of other diseases of the musculoskeletal system and connective tissue: Secondary | ICD-10-CM

## 2021-06-26 DIAGNOSIS — I480 Paroxysmal atrial fibrillation: Secondary | ICD-10-CM

## 2021-06-26 MED ORDER — TADALAFIL 5 MG PO TABS *I*
5.0000 mg | ORAL_TABLET | Freq: Every day | ORAL | 1 refills | Status: DC
Start: 2021-06-26 — End: 2022-02-15

## 2021-06-26 NOTE — Patient Instructions (Signed)
Schedule colonoscopy:   [facility]: [phone]) [phone]    Consider shingrix vaccine, covid vaccine

## 2021-06-26 NOTE — Progress Notes (Signed)
Chief complaint:  Follow up multiple issues     HPI: Jorge Boyd is a 61 y.o. man with a history of atrial fibrillation, kidney stones, pyelonephritis, and osteoarthritis, here for follow-up.    Was treated by video visit 06/17/2021 with Augmentin for sinusitis.  Feeling better. Working out again.     No urinary symptoms currently.   Urine testing negative 06/19/21.     Rosacea  Less noticeable when tan.   Azelaic acid gel every other day.       Hypertension  Continues HCTZ 25 mg daily and diltiazem 120 mg daily.  April 2023 BMP stable.      Paroxysmal atrial fibrillation  Single episode of atrial fibrillation with RVR Nov 2020 while hospitalized with sepsis.  Sees cardiology in [place] - Dr Andrey Campanile.   Continues diltiazem 120 mg daily and aspirin 81 mg daily.     Monitoring HR on smartwatch.   Denies chest pain, palpitations, shortness of breath, leg swelling, headache, dizziness.  +resolving cough  Plans to see cardiologist in [facility] later this fall.     Gout  Previous gout flares have been in feet, ankles, hands and wrists.  Continues allopurinol 300 mg daily.  April 2023 uric acid level 6.1.  No recent gout flare.     BPH  Continues tamsulosin 0.8 mg daily.  April 2023 PSA 2.82.  Some urinary incontinence after urination - dribbling for a minute after.   Nocturia 3 times/night.   Does drink a gallon of water/day. Urinates frequently during day.     Arthritis   Left shoulder and left hip are still painful.   Had injection left shoulder March 2023.   Had injection left hip May 2023 - not helpful.   Hoping for surgery this fall/winter.       Discussed vaccines: shingles, Tdap, covid    Colonoscopy - last estimates 6-7 years ago after diverticulitis.  2016 - had some polyps.       Medications:   Current Outpatient Medications   Medication Sig   . amoxicillin-clavulanate (AUGMENTIN) 875-125 mg tablet Take 1 tablet by mouth every 12 hours for 10 days  for Acute Bacterial Infection of the Sinuses   . tamsulosin (FLOMAX) 0.4 mg  capsule Take 2 capsules (0.8 mg total) by mouth daily   . allopurinol (ZYLOPRIM) 300 mg tablet Take 1 tablet (300 mg total) by mouth daily   . tadalafil (CIALIS) 5 MG tablet TAKE 1 TABLET BY MOUTH EVERY DAY   . hydroCHLOROthiazide (HYDRODIURIL) 25 mg tablet Take 1 tablet (25 mg total) by mouth every morning   . azelaic acid (FINACEA) 15 % gel After skin is washed and patted dry, gently but thoroughly massage a thin film into the affected area twice daily, in the AM and PM   . potassium citrate (UROCIT-K) 10 mEq (1080 mg) CR tablet Take 1 tablet (10 mEq total) by mouth daily   . dilTIAZem (TIAZAC, DILTZAC, TAZTIA XT) 120 mg 24 hr capsule diltiazem CD 120 mg capsule,extended release 24 hr   . aspirin 81 mg EC tablet Take 1 tablet (81 mg total) by mouth daily   . Multiple Vitamin (MULTIVITAMIN PO) Take by mouth     No current facility-administered medications for this visit.     Allergies:   Allergies   Allergen Reactions   . Demerol Hcl [Meperidine] Nausea And Vomiting       Past medical history, problem list, medications and allergies personally reviewed in eRecord today.  ROS: per HPI     BP 120/80 (BP Location: Left arm, Cuff Size: large adult)   Pulse 77   Temp 36.4 C (97.5 F) (Temporal)   Wt 129.3 kg (285 lb)   SpO2 98%   BMI 34.69 kg/m   Physical Exam  Constitutional:       Appearance: Normal appearance.   Cardiovascular:      Rate and Rhythm: Normal rate and regular rhythm.      Heart sounds: Normal heart sounds.   Pulmonary:      Effort: Pulmonary effort is normal.      Breath sounds: Normal breath sounds.   Musculoskeletal:      Right lower leg: No edema.      Left lower leg: No edema.   Neurological:      Mental Status: He is alert.         Assessment/Plan:    1. Paroxysmal atrial fibrillation  Asymptomatic.  No recent episodes of palpitations.  Heart regular on exam today.  Continues diltiazem 120 mg daily and aspirin 81 mg daily.  Jorge Boyd plans to see his cardiologist in [place] this fall.    2.  Primary hypertension  BP today well controlled, at goal of less than 130/80. April 2023 BMP stable.  Continues HCTZ 25 mg daily and diltiazem 120 mg daily.    3. History of gout  No recent gout flare.  April 2023 uric acid controlled at 6.1.  Continues allopurinol 300 mg daily.    4. Benign prostatic hyperplasia  Daytime urinary frequency, but drinks a lot of water.  Nocturia several times per night.  Also has some urinary dribbling requiring pad.  Plans for surgical intervention at some point, after joint issues remedied, likely in [place].  Continues tamsulosin 0.8 mg nightly and tadalafil 5 mg daily.    5. Need for Tdap vaccination  Counseled on Tdap today.   - Tdap (>/= 55yr) (Boostrix)    6. Screening for colon cancer  Discussed options - patient opts for colonoscopy.   - AMB REFERRAL [facility]        Patient Instructions   Schedule colonoscopy:   [facility]: [phone]) [phone]    Consider shingrix vaccine, covid vaccine         Follow-up: 6 months/as needed

## 2021-07-13 ENCOUNTER — Ambulatory Visit: Payer: BLUE CROSS/BLUE SHIELD | Admitting: Physical Medicine and Rehabilitation

## 2021-07-20 ENCOUNTER — Encounter: Payer: Self-pay | Admitting: Family Medicine

## 2021-07-21 ENCOUNTER — Ambulatory Visit: Payer: BLUE CROSS/BLUE SHIELD | Admitting: Medical

## 2021-07-21 DIAGNOSIS — H109 Unspecified conjunctivitis: Secondary | ICD-10-CM

## 2021-07-21 MED ORDER — POLYMYXIN B-TRIMETHOPRIM 10000-0.1 UNIT/ML-% OP SOLN *I*
1.0000 [drp] | Freq: Four times a day (QID) | OPHTHALMIC | 0 refills | Status: AC
Start: 2021-07-21 — End: 2021-07-28

## 2021-07-21 NOTE — Progress Notes (Signed)
TeleHome Video Encounter   Mode of Communication with Patient for This Visit    Mode of Communication with Patient for This Visit: Video  Patient Location: Home  Provider Location: Home       Visit date: 07/21/2021  Consent was obtained from the patient to complete this video visit; including the potential for financial liability.     Subjective   Jorge Boyd,21-Nov-1960, had concerns including Conjunctivitis.  Jorge Boyd is a 61 year old male with a PMH significant for atrial fibrillation, HTN, BPH, and gout who presented for an ODVV with a chief complaint of pink eye. He reports that over the past 2 days he has noticed that his R eye is pink, the lower lid seems swollen, and he has had some goopy discharge. He also noticed a bump on the internal lower lid- this is mildly tender and seemed to come on abruptly. Denies any vision changes. Does not wear contact lenses.         Objective   General: Pleasant male, non-toxic, NAD  Eye: R eye conjucntiva injected, +small spherical nodule internal lower lid- appears to be an internal hordeolum   Respiratory: breathing comfortably, non-labored, no audible wheezing, speaking in full sentences without difficulty        Assessment  & Plan    Acute Bacterial Conjunctivitis- has been having pink eye with purulent drainage. Rx for Polytrim eye drops sent to his pharmacy. Advised to wash pillow cases and wash hands frequently and thoroughly.     Internal Hordeolum- recommend warm, moist compresses several times per day and gentle eyelid massage. Discussed that this will likely resolve on its own within 1-2 weeks.                 Follow up: Follow up if symptoms worsen or fail to improve.  Author:   Emilie Rutter, PA    [facility] Medicine Primary Care Digital Health Team  Note signed: 07/21/2021

## 2021-07-21 NOTE — Patient Instructions (Signed)
If you have any questions or concerns about today's visit please contact the [facility] Digital Health Team at [phone]

## 2021-07-27 ENCOUNTER — Other Ambulatory Visit: Payer: Self-pay | Admitting: Family Medicine

## 2021-07-27 NOTE — Telephone Encounter (Signed)
Last office visit:   06/26/2021  Last telemedicine visit:  01/08/2021  Last PA office visit:  Visit date not found  Patients upcoming appointments:  Future Appointments   Date Time Provider Department Center   09/29/2021 10:45 AM [name], MD [facility] None     Recent Lab results:  GENERAL CHEMISTRY   Recent Labs     05/19/21  0953 01/06/21  0822 10/03/20  1013   NA 139 142 138   K 4.5 4.3 4.2   CL 101 100 101   CO2 28 28 25    GAP 10 14 12    UN 21* 18 22*   CREAT 1.14 1.13 1.15   GLU 108* 111* 103*   CA 9.8 9.9 9.9   URIC 6.1  --  6.7      LIPID PROFILE   Recent Labs     05/19/21  0953 10/03/20  1013   CHOL 154 199   TRIG 112 114   HDL 31* 38*   LDLC 101 138*      LIVER PROFILE   Recent Labs     05/19/21  0953 01/06/21  0822 10/03/20  1013   ALT 28 27 26    AST 25 20 24    ALK 100 112 96   TB 0.8 0.7 0.9      DIABETES THYROID   Recent Labs     10/03/20  1013   HA1C 5.5    Recent Labs     05/19/21  0953   TSH 2.81         Pending/Orders Labs:  Lab Frequency Next Occurrence   COVID-19 PCR Once 11/24/2020

## 2021-08-03 ENCOUNTER — Encounter: Payer: Self-pay | Admitting: Family Medicine

## 2021-08-03 DIAGNOSIS — H109 Unspecified conjunctivitis: Secondary | ICD-10-CM

## 2021-08-03 NOTE — Telephone Encounter (Signed)
Do you want him to go to [place]? Val

## 2021-08-03 NOTE — Telephone Encounter (Signed)
This patient attachment is not clinically relevant.  Please delete from their chart.    Thank you,  Marc Leichter R Lexani Corona, MD

## 2021-08-05 ENCOUNTER — Ambulatory Visit: Payer: BLUE CROSS/BLUE SHIELD

## 2021-08-05 ENCOUNTER — Other Ambulatory Visit: Payer: Self-pay

## 2021-08-05 DIAGNOSIS — H00022 Hordeolum internum right lower eyelid: Secondary | ICD-10-CM

## 2021-08-05 DIAGNOSIS — L719 Rosacea, unspecified: Secondary | ICD-10-CM

## 2021-08-05 MED ORDER — DOXYCYCLINE MONOHYDRATE 50 MG PO TABS *A*
100.0000 mg | ORAL_TABLET | Freq: Every evening | ORAL | 0 refills | Status: AC
Start: 2021-08-05 — End: 2021-09-04

## 2021-08-05 MED ORDER — NEOMYCIN-POLYMYXIN-DEXAMETH 3.5-10000-0.1 OP OINT *I*
TOPICAL_OINTMENT | Freq: Two times a day (BID) | OPHTHALMIC | 0 refills | Status: AC
Start: 2021-08-05 — End: 2021-08-19

## 2021-08-05 NOTE — Progress Notes (Signed)
Patient name: Jorge Boyd  DOB: [birth year]       Age: 61 y.o.  MR#: [id number]    Encounter Date: 08/05/2021    Subjective    Subjective:     Chief Complaint:   Chief Complaint   Patient presents with   . Redness In Eye   . Eye Discharge     HPI    49M no ocular history presenting with red eye discomfort, discharge, and a   bump on the lower lid, crusting, discharge. Wen to UC, given polytrim   drops reports no improvement.     Vision not affected.   Last edited by Lebron Conners, MD on 08/05/2021 12:16 PM.        Current Outpatient Medications (Ophthalmic)   Medication Sig Dispense Refill   . Neomycin-Polymyxin-Dexameth (MAXITROL) 3.5-10000-0.1 OINT Place into the right eye 2 times daily for 14 days  for Stye 3.5 g 0     Current Outpatient Medications (Other)   Medication Sig Dispense Refill   . doxycycline monohydrate (ADOXA) 50 MG tablet Take 2 tablets (100 mg total) by mouth nightly  for Rosacea 60 tablet 0   . hydroCHLOROthiazide (HYDRODIURIL) 25 mg tablet TAKE 1 TABLET BY MOUTH EVERY DAY IN THE MORNING 90 tablet 1   . tadalafil (CIALIS) 5 MG tablet Take 1 tablet (5 mg total) by mouth daily 90 tablet 1   . tamsulosin (FLOMAX) 0.4 mg capsule Take 2 capsules (0.8 mg total) by mouth daily 180 capsule 1   . allopurinol (ZYLOPRIM) 300 mg tablet Take 1 tablet (300 mg total) by mouth daily 90 tablet 1   . azelaic acid (FINACEA) 15 % gel After skin is washed and patted dry, gently but thoroughly massage a thin film into the affected area twice daily, in the AM and PM 50 g 2   . potassium citrate (UROCIT-K) 10 mEq (1080 mg) CR tablet Take 1 tablet (10 mEq total) by mouth daily     . dilTIAZem (TIAZAC, DILTZAC, TAZTIA XT) 120 mg 24 hr capsule diltiazem CD 120 mg capsule,extended release 24 hr     . aspirin 81 mg EC tablet Take 1 tablet (81 mg total) by mouth daily     . Multiple Vitamin (MULTIVITAMIN PO) Take by mouth                 Objective    Objective:     Base Eye Exam     Visual Acuity (Snellen -  Linear)       Right Left    Dist sc 20/25 -1 20/25          Pupils       Dark Light Shape React APD    Right 4 2 Round Brisk None    Left 4 2 Round Brisk None          Extraocular Movement       Right Left     Full, Ortho Full, Ortho          Neuro/Psych     Oriented x3: Yes            Slit Lamp and Fundus Exam     External Exam       Right Left    External Rosacea Normal ocular adnexae, lacrimal gland & drainage, orbits          Slit Lamp Exam       Right Left    Lids/Lashes Internal hordeolum - lower lid, Telangiectasia,  well circumscribed, last gland just temporal to punctum Meibomian gland dysfunction, Telangiectasia    Conjunctiva/Sclera 2+ Injection Normal bulbar/palpebral, conjunctiva, sclera    Cornea Normal epithelium, stroma, endothelium, tear film Normal epithelium, stroma, endothelium, tear film    Anterior Chamber Clear & deep Clear & deep    Iris Normal shape, size, morphology Normal shape, size, morphology    Lens Normal cortex, nucleus, anterior/posterior capsule, clarity Normal cortex, nucleus, anterior/posterior capsule, clarity                    Lab Results   Component Value Date    HA1C 5.5 10/03/2020         Assessment/Plan:     1. Hordeolum internum of right lower eyelid  Neomycin-Polymyxin-Dexameth (MAXITROL) 3.5-10000-0.1 OINT      2. Rosacea  doxycycline monohydrate (ADOXA) 50 MG tablet        In setting of rosacea, MGD. Close proximity to punctum.  Rx maxitrol BID x 2 weeks  D/C polytrim  Rx Doxycyline 100mg  nightly for rosacea.   Continue metronidazole gel as previously prescribed.  Continue warm compresses  Given size and nice borders good chance of residual chalazion  If NI with maxitrol may need excision and drainage.     Follow up in about 4 weeks (around 09/02/2021) for Reassessment oculoplastics .    Patient seen and discussed with supervising physician, Dr. Shayne Alken    Lebron Conners, MD   Ophthalmology R2  08/05/2021 at 12:40 PM

## 2021-08-18 ENCOUNTER — Encounter: Payer: Self-pay | Admitting: Orthopedic Surgery

## 2021-08-18 ENCOUNTER — Encounter: Payer: Self-pay | Admitting: Family Medicine

## 2021-08-18 ENCOUNTER — Other Ambulatory Visit: Payer: Self-pay | Admitting: Orthopedic Surgery

## 2021-08-18 ENCOUNTER — Telehealth: Payer: Self-pay

## 2021-08-18 DIAGNOSIS — M1612 Unilateral primary osteoarthritis, left hip: Secondary | ICD-10-CM

## 2021-08-18 NOTE — Progress Notes (Signed)
tota

## 2021-08-18 NOTE — Telephone Encounter (Signed)
Can you do a letter for Jorge Boyd so we can send to Dr. Darrol Angel. Val S LPN

## 2021-08-18 NOTE — Telephone Encounter (Signed)
Reviewed chart and CS order, routed the following message to the [facility] scheduling team: Okay to schedule surgery.  He would like to schedule asap because he would like to leave for [place] late Sept. Early Oct.

## 2021-08-20 ENCOUNTER — Encounter: Payer: Self-pay | Admitting: Family Medicine

## 2021-08-20 NOTE — Progress Notes (Unsigned)
61 year old man with history of paroxysmal atrial fibrillation (while hospitalized with sepsis) and hypertension, both currently controlled, on diltiazem and HCTZ.   He exercises vigorously regularly, without any cardiac symptoms.   I do not have a recent EKG for review.   If his EKG is normal at pre-op on 08/31/21, then he is cleared for surgery without further evaluation.

## 2021-08-27 ENCOUNTER — Other Ambulatory Visit: Payer: Self-pay

## 2021-08-27 ENCOUNTER — Ambulatory Visit: Payer: BLUE CROSS/BLUE SHIELD | Admitting: Orthopedic Surgery

## 2021-08-27 ENCOUNTER — Encounter: Payer: Self-pay | Admitting: Orthopedic Surgery

## 2021-08-27 VITALS — BP 130/91 | Ht 74.0 in | Wt 300.0 lb

## 2021-08-27 DIAGNOSIS — M1612 Unilateral primary osteoarthritis, left hip: Secondary | ICD-10-CM

## 2021-08-27 NOTE — Progress Notes (Signed)
Interval history: Jorge Boyd is a 61 y.o. male who presents with many years history of Left hip pain.  It began 2018, it has worsened since it began.  He is no longer able to play in his softball games, he has trouble even walking.  He is ready to consider left hip replacement.  He had prior cortisone injections which helped temporarily.  He was taking motrin and tylenol daily.  He has pain at night in the groin.    The patient denies any radicular symptoms, fevers, chills, or recent falls.    PMH          Past Medical History:   Diagnosis Date   . Atrial fibrillation    . COVID-19 02/2019   . Diverticulitis    . Gout    . Nephrolithiasis    . Pyelonephritis 12/2018   . Sepsis 12/2018    due to pyelonephritis    . Shingles 2016       PSH        Past Surgical History:   Procedure Laterality Date   . ANKLE FRACTURE SURGERY Left    . FOREARM FRACTURE SURGERY Left    . HIP REPLACEMENT Right 10/2019    in [place]   . INGUINAL HERNIA REPAIR Right 07/2017   . INGUINAL HERNIA REPAIR Left 2012   . LITHOTRIPSY  2021    x2   . UMBILICAL HERNIA REPAIR  07/2018    incisional hernia repaired       Medications      Current Outpatient Medications   Medication   . tamsulosin (FLOMAX) 0.4 mg capsule   . tadalafil (CIALIS) 5 MG tablet   . potassium citrate (UROCIT-K) 10 mEq (1080 mg) CR tablet   . hydroCHLOROthiazide (HYDRODIURIL) 25 mg tablet   . dilTIAZem (TIAZAC, DILTZAC, TAZTIA XT) 120 mg 24 hr capsule   . allopurinol (ZYLOPRIM) 300 mg tablet   . aspirin 81 mg EC tablet   . Multiple Vitamin (MULTIVITAMIN PO)     No current facility-administered medications for this visit.       Allergies       Allergies   Allergen Reactions   . Demerol Hcl [Meperidine] Nausea And Vomiting     Social History   reports that he has never smoked. He has never used smokeless tobacco. He reports current alcohol use. He reports that he does not use drugs.    Review of Systems  A  review of systems was conducted.  Pertinent  positive and negative findings other than what is documented in the hpi are negative.    Physical Examination:  General: well appearing no acute distress at rest  Skin: no evidence of rashes, bruising, open wounds; right hip incision anterior approach well healed    Lower Extremity Musculoskeletal Examination:  Gait: walks with a coxalgic gait, BMI 38  Leg length: on supine examination, leg lengths appear fairly equal .    Peripheral vascular: no edema, lower extremity warm and well perfused    Left Hip: no tenderness to palpation over greater trochanter, hip ROM flexion 0-100 degrees, internal rotation in flexion 10 degrees with groin pain, external rotation in flexion 35 degrees, abduction in flexion 30 degrees, adduction in flexion 25 degrees.  has groin pain with straight leg raise. Normal muscle tone.    Left Knee: neutral mechanical alignment of the knee.  ROM 0-120 painless.      Neurovascular exam: good strength and sensation distally, distal extremity is warm  and perfused.    Radiographs:   I personally reviewed the following imaging with pertinent findings:  AP and lateral Hip dated today ordered by me was reviewed and interpreted from me today shows severe osteoarthritis of the left hip with complete loss of superolateral joint space, subchondral sclerosis .  No other bony or soft tissue injury seen.    A/P: Jorge Boyd is a 61 y.o. male with severe osteoarthritis of the left hip.  We discussed his diagnosis and treatment options.   I discussed with the patient continued conservative management including anti-inflammatory medications (aleve, advil) and tylenol, activity modification, weight loss, use of assistive devices, and intra-articular injections such as cortisone.  We also discussed the indications and expected recovery from total hip replacement, which he is familiar with due to his right THA previously.  I spoke to him about risks, benefits, and alternatives.  Risks include but not  limited to infection, dislocation, chronic pain, nerve injury including lateral femoral cutaneous nerve and sciatic nerve, dvt, pulmonary embolism, need for revision surgery, leg length inequality, fracture of femur or acetabulum, perioperative medical or anesthetic complications including heart attack, stroke, ileus, bowel rupture.  All questions were answered by me.  Answers for HPI/ROS submitted by the patient on 08/20/2021  What is your goal for today's visit?: Move forward with Surgery  Date of onset: : 04/21/2015  Was this the result of an injury?: No  What is your pain level?: 7/10  Please describe the quality of your pain: : aching, catching, discomfort, dull ache, grinding, instability, sharp, shooting, sore, stiffness, throbbing  What diagnostic workup have you had for this condition?: X-ray  What treatments have you tried for this condition?: acetaminophen, corticosteroid injection, home exercise program, ice, NSAIDs  Progression since onset: : gradually worsening  Is this a work related condition? : No  Current work status: : usual activities  Fever: No  Chills: No  Numbness: No  Tingling: No

## 2021-08-27 NOTE — Discharge Instructions (Signed)
Pre-Operative Instructions - Total Joint Replacement                FOLLOW YOUR SURGEON'S INSTRUCTIONS IF DIFFERENT THAN BELOW.                   PRIOR TO SURGERY  Five days before surgery, please STOP taking if surgeon does not specify:  Anti-inflammatory medications (Ibuprofen, Motrin, Advil, Mobic, Meloxicam, Aleve, Naproxen, Voltaren, etc.)  Vitamins and herbal supplements, including herbal teas   YOU MAY TAKE ACETAMINOPHEN (TYLENOL) as needed      THREE NIGHTS BEFORE SURGERY FOR INFECTION CONTROL  We ask you to shower for 3 evenings before your surgery using the 4% Chlorhexidine scrubs the nurse has given you.   Each night take a shower using your normal soap and shampoo.  Afterward, while still in the shower, use 4% Chlorhexidine scrub and wash your body from the neck down. DO NOT wash your head, face, eyes and ears with the Chlorhexidine soap. This soap does not lather. Let the soap sit on your skin for 2 minutes. Rinse thoroughly.   Dry off with a clean, fresh towel each evening.  Do not apply lotion after your showers. Do not shave below your waist for one week before your surgery.  If you experience itching or redness on application, wash it off and do not use it again. Continue with an over-the-counter antibacterial soap (such as Dial).  After showering, dress in freshly laundered clothes each evening and sleep on clean bedsheets.      ARRIVAL/SURGICAL TIME  Staff from the [facility] will call you between 1:30 PM and 4 PM on the day before surgery to inform you of your arrival and surgery times. The call will be on Friday if your surgery is on Monday.      DAY BEFORE SURGERY  Keep yourself well hydrated to aid in the placement of your IV and for your general well-being.  Do not eat anything after midnight the night before your surgery (including candy or gum).                                                                                           DAY OF SURGERY  DO NOT CONSUME FOOD OF ANY  KIND. Only Gatorade, clear apple juice or water from midnight until 2 hours before your scheduled surgery.    Valuables such as jewelry, cash and credit cards are best left at home during your stay.  Please send them home with family members.  If you are alone a staff member will assist you in storing your belongings.    Before coming to the hospital, remove all makeup (including mascara), jewelry (including wedding band and watch), hair accessories and nail polish from toes and fingers.  Do not bring any valuables (money, wallet, purse, jewelry, or contact lenses.)    We ask that your cell phone be with you and turned on, so that the Preop and Operating Room nurses may phone you directly with instructions on the morning of surgery if necessary.    If wearing eyeglasses, please bring a case.    DO NOT WEAR CONTACT LENSES.    You may brush your teeth, shower (with Chlorhexidine soap) and use deodorant.    PLEASE BRING YOUR CPAP MASK and TUBING INTO THE PREOPERATIVE AREA.    Patient visitation is restricted at this time due to COVID-19 concerns.       MEDICATIONS: DAY OF SURGERY  Take your medications as directed according to your printed, verbal or MyChart instructions.    Please leave your prescriptions at home with the exception of your inhalers.    AT THE HOSPITAL ON THE DAY OF SURGERY  Park in the Main Ramp garage.  Enter the building through the Main Lobby.     Please stop at the Information Desk for directions to the Surgery Center on [address].    Leave your belongings in the car (except for your CPAP MASK and TUBING.) Your visitors may bring them to your room after surgery.    [facility]  As a convenience, prior to your discharge we will fill any discharge medications you require.      The pharmacy is open from 9am to 5:30pm on weekdays and 10am-2pm on Saturdays.     If you will be alone on the day of surgery and want to leave with your prescribed medication, you may:  Bring a check  made out to [facility]  Use a credit card to pay for your prescription  Have your family call the pharmacy with a credit card number  Only bring cash to the hospital as a last resort. Prescriptions cannot be filled without payment. Thank you for your consideration.    QUESTIONS?  Question about these instructions? Call [phone], between the hours of 8 AM- 4 PM Monday through Friday.   Any questions regarding specifics about your surgery or recovery? Please call your surgeon’s office.

## 2021-08-31 ENCOUNTER — Other Ambulatory Visit
Admission: RE | Admit: 2021-08-31 | Discharge: 2021-08-31 | Disposition: A | Discharge status: Home / Self Care | Payer: BLUE CROSS/BLUE SHIELD | Source: Ambulatory Visit | Attending: Orthopedic Surgery | Admitting: Orthopedic Surgery

## 2021-08-31 ENCOUNTER — Ambulatory Visit
Admission: RE | Admit: 2021-08-31 | Discharge: 2021-08-31 | Disposition: A | Discharge status: Home / Self Care | Payer: BLUE CROSS/BLUE SHIELD | Source: Ambulatory Visit

## 2021-08-31 ENCOUNTER — Ambulatory Visit
Admission: RE | Admit: 2021-08-31 | Discharge: 2021-08-31 | Disposition: A | Discharge status: Home / Self Care | Payer: BLUE CROSS/BLUE SHIELD | Source: Ambulatory Visit | Attending: Orthopedic Surgery | Admitting: Orthopedic Surgery

## 2021-08-31 ENCOUNTER — Encounter: Payer: Self-pay | Admitting: Orthopedic Surgery

## 2021-08-31 ENCOUNTER — Other Ambulatory Visit: Payer: Self-pay

## 2021-08-31 VITALS — BP 130/98 | HR 76 | Temp 97.5°F | Resp 16 | Ht 74.02 in | Wt 296.6 lb

## 2021-08-31 DIAGNOSIS — Z789 Other specified health status: Secondary | ICD-10-CM | POA: Insufficient documentation

## 2021-08-31 DIAGNOSIS — Z01818 Encounter for other preprocedural examination: Secondary | ICD-10-CM | POA: Insufficient documentation

## 2021-08-31 DIAGNOSIS — Z01812 Encounter for preprocedural laboratory examination: Secondary | ICD-10-CM | POA: Insufficient documentation

## 2021-08-31 DIAGNOSIS — I440 Atrioventricular block, first degree: Secondary | ICD-10-CM | POA: Insufficient documentation

## 2021-08-31 DIAGNOSIS — M1612 Unilateral primary osteoarthritis, left hip: Secondary | ICD-10-CM | POA: Insufficient documentation

## 2021-08-31 LAB — EKG 12-LEAD
P: 40 deg
PR: 247 ms
QRS: -14 deg
QRSD: 109 ms
QT: 404 ms
QTc: 414 ms
Rate: 63 {beats}/min
T: -55 deg

## 2021-08-31 LAB — CBC
Hematocrit: 48 % (ref 40–51)
Hemoglobin: 16.4 g/dL (ref 13.7–17.5)
MCH: 31 pg (ref 26–32)
MCHC: 34 g/dL (ref 32–37)
MCV: 89 fL (ref 79–92)
Platelets: 220 10*3/uL (ref 150–330)
RBC: 5.4 MIL/uL (ref 4.6–6.1)
RDW: 13 % (ref 11.6–14.4)
WBC: 6.5 10*3/uL (ref 4.2–9.1)

## 2021-08-31 LAB — COMPREHENSIVE METABOLIC PANEL
ALT: 28 U/L (ref 0–50)
AST: 26 U/L (ref 0–50)
Albumin: 4.9 g/dL (ref 3.5–5.2)
Alk Phos: 120 U/L (ref 40–130)
Anion Gap: 14 (ref 7–16)
Bilirubin,Total: 0.8 mg/dL (ref 0.0–1.2)
CO2: 25 mmol/L (ref 20–28)
Calcium: 10 mg/dL (ref 8.6–10.2)
Chloride: 102 mmol/L (ref 96–108)
Creatinine: 1.06 mg/dL (ref 0.67–1.17)
Glucose: 109 mg/dL — ABNORMAL HIGH (ref 60–99)
Lab: 23 mg/dL — ABNORMAL HIGH (ref 6–20)
Potassium: 4 mmol/L (ref 3.3–5.1)
Sodium: 141 mmol/L (ref 133–145)
Total Protein: 6.8 g/dL (ref 6.3–7.7)
eGFR BY CREAT: 81 *

## 2021-08-31 LAB — MSSA NASAL NAAT (PCR): MSSA Nasal NAAT (PCR): NEGATIVE

## 2021-08-31 LAB — PROTIME-INR
INR: 1 (ref 0.9–1.1)
Protime: 11.6 s (ref 10.0–12.9)

## 2021-08-31 LAB — POCT HGB: Hgb, POC: 15.2 g/dL (ref 13.7–17.5)

## 2021-08-31 LAB — MRSA NASAL NAAT (PCR): MRSA nasal NAAT (PCR): NEGATIVE

## 2021-08-31 LAB — APTT: aPTT: 33.8 s (ref 25.8–37.9)

## 2021-08-31 LAB — TYPE AND SCREEN
ABO RH Blood Type: O POS
Antibody Screen: NEGATIVE

## 2021-08-31 LAB — HEMOGLOBIN A1C: Hemoglobin A1C: 5.7 % — ABNORMAL HIGH

## 2021-08-31 NOTE — H&P (View-Only) (Signed)
Anesthesia Pre-operative History and Physical for Jorge Boyd  History and Physical Performed at [facility]  Highlighted Issues for this Procedure:  61 y.o. male with Primary osteoarthritis of left hip (M16.12) presenting for Procedure(s):  LEFT ARTHROPLASTY, HIP, TOTAL, ANTERIOR APPROACH by Surgeon(s):  Zella Ball, MD scheduled for 150 minutes.    Allergies:   -- Demerol Hcl (Meperidine) -- Nausea And Vomiting    Habitus:  - Estimated body mass index is 38.06 kg/m as calculated from the following:     Height as of 08/31/21: 1.88 m (6' 2.02").    Weight as of 08/31/21: 134.5 kg (296 lb 9.6 oz).    PMH:  Cardiovascular:  -Atrial fibrillation- paroxysmal while hospitalized with sepsis in 2020  -HTN- on diltiazem and hydrochlorothiazide, sees cardiology in [place] - Dr Andrey Campanile    Other:  -Primary osteoarthritis of left hip   -BPH  -Sepsis- due to pyelonephritis in 2020   -Kidney stones  -Gout- allopurinol  -Diverticulitis    Anesthetic history:  1). No previous anesthetic records were available for comparison.          Electrophysiology/AICD/Pacer:  EKG 08/31/21    HR 63, NSR. Prolonged PR interval. No previous EKG on file for comparison.    .  [facility] Summary:  Jorge Boyd presents preoperatively for anesthesia evaluation prior to LEFT ARTHROPLASTY, HIP, TOTAL, ANTERIOR APPROACH (Left: Hip) with regional anesthesia on 09/09/21 by Dr. Darrol Angel.     He has a medical history significant for:  Primary osteoarthritis of left hip   BMI 38.06  Atrial fibrillation- paroxysmal while hospitalized with sepsis in 2020  HTN- on diltiazem and hydrochlorothiazide, sees cardiology in [place] - Dr Andrey Campanile  BPH  Sepsis- due to pyelonephritis in 2020   Kidney stones  Gout- allopurinol  Diverticulitis            The patient has no dyspnea, denies chest pain, is moderately active, climbs stairs and can lie flat.  By Debbe Odea, NP at 8:30 AM on 08/31/2021    Anesthesia Evaluation Information Source: patient, records      ANESTHESIA HISTORY  Pertinent(-):  No History of anesthetic complications or Family hx of anesthetic complications    GENERAL  Pertinent (-):  No infection, history of anesthetic complications or Family Hx of Anesthetic Complications    HEENT  Pertinent (-):  No glaucoma, visual impairment, hearing loss, TMJD, sinus issues, nosebleeds or neck pain PULMONARY  Pertinent(-):  No smoking, asthma, shortness of breath, snoring, sleep apnea or COPD    CARDIOVASCULAR  Good(4+METs) Exercise Tolerance    + Hypertension    + Anticoagulants          ASA    + Hx of Dysrhythmias (while septic from UTI)          atrial fib  Pertinent(-):  No past MI, CAD, valvular heart disease, orthopnea or hx of DVT    Comment: Can climb 1 FOS without dyspnea or CP, goes hiking, plays competitive softball, no dyspnea or CP    GI/HEPATIC/RENAL       + Renal Issues          hx of kidney stones  Pertinent(-):  No GERD, nausea, vomiting, liver  issues, pancreatic issues, bowel issues or  esophageal issues (denies dysphagia )  NEURO/PSYCH/ORTHO  Pertinent(-):  No dizziness/motion sickness, syncope, seizures, cerebrovascular event, peripheral nerve issue or gait/mobility issues    ENDO/OTHER  Pertinent(-):  No diabetes mellitus, thyroid disease, pituitary disease, adrenal disease, hormone use, steroid  use, chemo Hx    HEMATOLOGIC          ASA    + Arthritis          hips  Pertinent(-):  No bruising/bleeding easily, coagulopathy, blood transfusion, blood dyscrasia or autoimmune disease         Physical Exam    Airway            Mouth opening: normal            Mallampati: II            TM distance (fb): >3 FB            Neck ROM: full  Dental   Normal Exam   Comment: Denies loose or broken teeth   Cardiovascular           Rhythm: regular           Rate: normal  No murmur        General Survey    Normal Exam  No rashes, wounds   Pulmonary   Normal Exam    breath sounds clear to auscultation    No wheezes    Mental Status   Normal Exam    oriented to  person, place and time     Patient Education from [facility] Provider:  Patient Education:  The following items were discussed with Jorge Boyd to his satisfaction and comprehension:  The facility's NPO guidelines were discussed  To call the surgeon if he becomes ill prior to surgery  All questions were answered  Transportation home: see AVS  Medications DOS with sip of water: see AVS  Hold medications AM day of surgery: see AVS  Nurse reviewed additional items as indicated in the education record.  Jorge Boyd verbalized knowledge and teaching objectives met.  No barriers to learning identified.  Teaching sheet reviewed with patient/family.  IV insertion was reviewed with him.  The importance of coughing and deep breathing was emphasized.  He was instructed on the pain scale and pain management.  The [facility] valuable policy was discussed.  The patient was instructed not to wear jewelry.  Jorge Boyd was instructed to continue ASA/ hold NSAIDS 5 days before surgery.  Chlohexidine scrub instructions reviewed.    ________________________________________________________________________  PLAN  ASA Score  3       Induction (routine IV) General Anesthesia/Sedation Maintenance Plan (inhaled agents); Airway (LMA); Line ( use current access); Monitoring (standard ASA); Positioning (supine); PONV Plan (dexamethasone and ondansetron); Pain (per surgical team); PostOp (PACU)    Informed Consent     Risks:         Risks discussed were commensurate with the plan listed above with the following specific points: N/V, aspiration, sore throat, failed block and infection, Damage to: teeth and eyes, allergic Rx.    Anesthetic Consent:         Anesthetic plan (and risks as noted above) were discussed with patient

## 2021-08-31 NOTE — Anesthesia Preprocedure Evaluation (Addendum)
Anesthesia Pre-operative History and Physical for Jorge Boyd  History and Physical Performed at [facility]  Highlighted Issues for this Procedure:  61 y.o. male with Primary osteoarthritis of left hip (M16.12) presenting for Procedure(s):  LEFT ARTHROPLASTY, HIP, TOTAL, ANTERIOR APPROACH by Surgeon(s):  Zella Ball, MD scheduled for 150 minutes.    Allergies:   -- Demerol Hcl (Meperidine) -- Nausea And Vomiting    Habitus:  - Estimated body mass index is 38.06 kg/m? as calculated from the following:     Height as of 08/31/21: 1.88 m (6' 2.02").    Weight as of 08/31/21: 134.5 kg (296 lb 9.6 oz).    PMH:  Cardiovascular:  -Atrial fibrillation- paroxysmal while hospitalized with sepsis in 2020  -HTN- on diltiazem and hydrochlorothiazide, sees cardiology in [place] - Dr Andrey Campanile    Other:  -Primary osteoarthritis of left hip   -BPH  -Sepsis- due to pyelonephritis in 2020   -Kidney stones  -Gout- allopurinol  -Diverticulitis    Anesthetic history:  1). No previous anesthetic records were available for comparison.          Electrophysiology/AICD/Pacer:  EKG 08/31/21    HR 63, NSR. Prolonged PR interval. No previous EKG on file for comparison.    .  [facility]PAT Summary:  Jorge Boyd presents preoperatively for anesthesia evaluation prior to LEFT ARTHROPLASTY, HIP, TOTAL, ANTERIOR APPROACH (Left: Hip) with regional anesthesia on 09/09/21 by Dr. Darrol Angel.     He has a medical history significant for:  Primary osteoarthritis of left hip   BMI 38.06  Atrial fibrillation- paroxysmal while hospitalized with sepsis in 2020  HTN- on diltiazem and hydrochlorothiazide, sees cardiology in [place] - Dr Andrey Campanile  BPH  Sepsis- due to pyelonephritis in 2020   Kidney stones  Gout- allopurinol  Diverticulitis            The patient has no dyspnea, denies chest pain, is moderately active, climbs stairs and can lie flat.  By Debbe Odea, NP at 8:30 AM on 08/31/2021    Anesthesia Evaluation Information Source: patient, records      ANESTHESIA HISTORY  Pertinent(-):  No History of anesthetic complications or Family hx of anesthetic complications    GENERAL  Pertinent (-):  No infection, history of anesthetic complications or Family Hx of Anesthetic Complications    HEENT  Pertinent (-):  No glaucoma, visual impairment, hearing loss, TMJD, sinus issues, nosebleeds or neck pain PULMONARY  Pertinent(-):  No smoking, asthma, shortness of breath, snoring, sleep apnea or COPD    CARDIOVASCULAR  Good(4+METs) Exercise Tolerance    + Hypertension    + Anticoagulants          ASA    + Hx of Dysrhythmias (while septic from UTI)          atrial fib  Pertinent(-):  No past MI, CAD, valvular heart disease, orthopnea or hx of DVT    Comment: Can climb 1 FOS without dyspnea or CP, goes hiking, plays competitive softball, no dyspnea or CP    GI/HEPATIC/RENAL   NPO: > 8hrs ago (solids)      + Renal Issues          hx of kidney stones  Pertinent(-):  No GERD, nausea, vomiting, liver  issues, pancreatic issues, bowel issues or  esophageal issues (denies dysphagia )  NEURO/PSYCH/ORTHO  Pertinent(-):  No dizziness/motion sickness, syncope, seizures, cerebrovascular event, peripheral nerve issue or gait/mobility issues    ENDO/OTHER  Pertinent(-):  No diabetes mellitus, thyroid disease, pituitary  disease, adrenal disease, hormone use, steroid use, chemo Hx    HEMATOLOGIC          ASA    + Arthritis          hips  Pertinent(-):  No bruising/bleeding easily, coagulopathy, blood transfusion, blood dyscrasia or autoimmune disease         Physical Exam    Airway            Mouth opening: normal            Mallampati: II            TM distance (fb): >3 FB            Neck ROM: full  Dental   Normal Exam   Comment: Denies loose or broken teeth   Cardiovascular           Rhythm: regular           Rate: normal  No murmur        General Survey    Normal Exam  No rashes, wounds   Pulmonary   Normal Exam    breath sounds clear to auscultation    No wheezes    Mental Status    Normal Exam    oriented to person, place and time     Patient Education from [facility] Provider:  Patient Education:  The following items were discussed with Jorge Boyd to his satisfaction and comprehension:  The facility's NPO guidelines were discussed  To call the surgeon if he becomes ill prior to surgery  All questions were answered  Transportation home: see AVS  Medications DOS with sip of water: see AVS  Hold medications AM day of surgery: see AVS  Nurse reviewed additional items as indicated in the education record.  Jorge Boyd verbalized knowledge and teaching objectives met.  No barriers to learning identified.  Teaching sheet reviewed with patient/family.  IV insertion was reviewed with him.  The importance of coughing and deep breathing was emphasized.  He was instructed on the pain scale and pain management.  The [facility] valuable policy was discussed.  The patient was instructed not to wear jewelry.  Jorge Boyd was instructed to continue ASA/ hold NSAIDS 5 days before surgery.  Chlohexidine scrub instructions reviewed.    ________________________________________________________________________  PLAN  ASA Score  3  Anesthetic Plan general       Induction (routine IV) General Anesthesia/Sedation Maintenance Plan (inhaled agents and IV bolus);  Airway Manipulation (none); Airway (LMA); Line ( use current access); Monitoring (standard ASA); Positioning (supine); PONV Plan (dexamethasone and ondansetron); Pain (per surgical team); PostOp (PACU)Standard Attestation    Informed Consent     Risks:         Risks discussed were commensurate with the plan listed above with the following specific points: N/V, aspiration, sore throat, failed block and infection, Damage to: teeth, eyes, nerves and blood vessels, allergic Rx and unexpected serious injury.    Anesthetic Consent:         Anesthetic plan (and risks as noted above) were discussed with patient    Blood products Consent:        Use of blood  products discussed with: patient and they consented    Plan also discussed with team members including:       attending    Responsible Anesthesia Provider Attestation:  I attest that the patient or proxy understands and accepts the risks and benefits of the anesthesia plan. I also  attest that I have personally performed a pre-anesthetic examination and evaluation, and prescribed the anesthetic plan for this particular location within 48 hours prior to the anesthetic as documented. Lauro Regulus, MD  09/09/21, 11:20 AM

## 2021-09-01 ENCOUNTER — Ambulatory Visit: Payer: BLUE CROSS/BLUE SHIELD | Admitting: Ophthalmology

## 2021-09-01 NOTE — Progress Notes (Signed)
Pre-Visit Planning    Health Maintenance Due   Topic Date Due   • COVID-19 Vaccine (1) Never done   • HIV Screening [facility]/[place]  Never done   • Hepatitis C Screening [facility]/[place]  Never done   • Colon Cancer Screening [facility]F  Never done   • IMM-ZOSTER (1 of 2) Never done

## 2021-09-03 NOTE — Comprehensive Assessment (Signed)
09/03/21 0929   Contact Information   Relationship to Patient  Spouse   Is spokesperson the patient's caregiver after discharge? Yes   Discharge transportation   Relationship to Patient  Spouse      09/03/21 0929   Demographics   County of Residence [place]   Marital status Married   Ethnicity/Race Caucasian   Primary Language English   Primary Care Taker of? No one   Risk Factors   Risk Factors Adjustment to Dx/Injury/Illness   Living Situation   Lives With Spouse   Can they assist patient after discharge? Yes   Home Geography   Type of Home Split-level   # Of Steps In Home 5   # Steps to Enter Home 2   Bedroom Second floor   Bathroom Second floor - full   Utilitites Working Yes   Palliative Care Assessment   Person assessed Patient   Coping Status Appropriate to Stressor   Current Goal of Care Curative   Baseline ADL functioning   Transfers Independent   Ambulation Independent   Assistive Device none   Bathing/Grooming Independent   Meal Prep Independent   Able to feed self? Yes   Household maintenance/chores Independent   Able to drive? Yes   Income Information   Vocational Full time employment   Home Care Services   Current home equipment available  Walker;Cane   Time Spent   Time Spent with Patient (min) 10     Spoke with pt to review d/c plan after upcoming surgery, chart reviewed. The above information is received from [place], confirmed DME and support person. SW reviewed home care and options, selected [facility], referral initiated today. Pt has no concern for d/c to home with spouse.    Fernand Parkins MSW  Pager: [phone]

## 2021-09-04 ENCOUNTER — Encounter: Payer: Self-pay | Admitting: Family Medicine

## 2021-09-04 NOTE — Progress Notes (Signed)
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 Are you okay with him proceeding with THA next week or would you like him to see cardiology first?     Thanks for your help,   [name], NP       EKG 08/31/21 reviewed  NSR with prolonged PR interval, 247 msec.     Chart reviewed  I did find an EKG report from 01/12/19 - PR was 255, normal QRS and QTc, normal rate and axis, NSR.     He is on diltiazem 120 mg daily    He is asymptomatic from 1st degree AV block.  Doesn't need further eval prior to hip replacement.

## 2021-09-07 NOTE — Progress Notes (Signed)
HOME CARE DISCHARGE PLAN    Home visit address/ contact phone confirmed: yes    Has the patient had any falls (with or without injury) in the last 3 months? no  Has the patient had any near misses (where they would have fallen, but caught self) in the last 3 months? no    Designated caregiver (name and phone #):  Sosuke Cortright [facility])   [address]   [place] [address]   [place]   [phone] (M)    Current DME or supplies in home: [facility]    The following services have been arranged with [facility] [phone]:  _x_____Physical Therapy     The agency will call to schedule first home visit date for (CHN,PT etc) services within 24-48 hours of facility discharge, and pt/family are in agreement.    Special instructions for scheduling first visit - call to arrange visit times.    Evaluation for any medical equipment and/or supplies have been arranged thru hospital prior to discharge    Blood draw is not applicable ___x__      Focus of care and identified patient/caregiver teaching needs at home - Procedure: LEFT ARTHROPLASTY, HIP, TOTAL, ANTERIOR APPROACH (Left: Hip)    Confirmed Attending signed MD:  Dr [name], MD  [phone] will sign All other home care orders   Confirmed by/ Date of confirmation:  MD and surgeon notified via staff message regarding home care orders 8/7     Confirmed Ancillary signing MD:  Dr Zella Ball, MD will Only sign orders related to Procedure: LEFT ARTHROPLASTY, HIP, TOTAL, ANTERIOR APPROACH (Left: Hip)  Confirmed by:  Per previous agreement  Date of confirmation: 8/7      Insurance Information: Radiographer, therapeutic informed of home care/ DME copays  _x__not applicable  ___ Yes    Patient has been screened for Medicare as a Secondary Payer (MSP)  ___eligible for [facility]   _x__not eligible    MVA or WC case - no       The above arrangements are based on an in-hospital evaluation. It is short-term and will be re-evaluated by the Home  Health Care Nurse in the home on a regular basis, as per physician's order.

## 2021-09-07 NOTE — Progress Notes (Signed)
Home Health Assessment - please submit the Ref 34 service request order on the day of discharge.      Completed by: Daved Mcfann L. Kavaughn Faucett  Intake LPN Home Care Coordinator    Referred by:  Jennifer Sanz       Source of Information: Medical record       Home Health indicators present: Pt will need follow up at home       Barriers to discharge to be addressed:  Will assess       Question Patient?   No     Plan:  Writer reviewed chart. [facility] will continue to follow hospital course and arrange appropriate services at time of discharge.     Thank you.  Darrick Greenlaw L. Reymond Maynez  Intake LPN Home Care Coordinator  [facility]   [address]  [place], [place] [address]  O: [phone]  [email or web address]  [email or web address]

## 2021-09-09 ENCOUNTER — Inpatient Hospital Stay: Payer: BLUE CROSS/BLUE SHIELD | Admitting: Physician Assistant

## 2021-09-09 ENCOUNTER — Inpatient Hospital Stay: Payer: BLUE CROSS/BLUE SHIELD

## 2021-09-09 ENCOUNTER — Inpatient Hospital Stay
Admission: RE | Admit: 2021-09-09 | Discharge: 2021-09-10 | Disposition: A | Discharge status: Home Health | Payer: BLUE CROSS/BLUE SHIELD | Source: Ambulatory Visit | Attending: Orthopedic Surgery | Admitting: Orthopedic Surgery

## 2021-09-09 ENCOUNTER — Inpatient Hospital Stay: Payer: BLUE CROSS/BLUE SHIELD | Admitting: Anesthesiology

## 2021-09-09 ENCOUNTER — Encounter
Admission: RE | Disposition: A | Discharge status: Home Health | Payer: Self-pay | Source: Ambulatory Visit | Attending: Orthopedic Surgery

## 2021-09-09 ENCOUNTER — Other Ambulatory Visit: Payer: Self-pay

## 2021-09-09 DIAGNOSIS — Z96649 Presence of unspecified artificial hip joint: Secondary | ICD-10-CM

## 2021-09-09 DIAGNOSIS — M25552 Pain in left hip: Secondary | ICD-10-CM

## 2021-09-09 DIAGNOSIS — Z9889 Other specified postprocedural states: Secondary | ICD-10-CM

## 2021-09-09 DIAGNOSIS — T797XXA Traumatic subcutaneous emphysema, initial encounter: Secondary | ICD-10-CM

## 2021-09-09 DIAGNOSIS — M1612 Unilateral primary osteoarthritis, left hip: Secondary | ICD-10-CM

## 2021-09-09 DIAGNOSIS — I10 Essential (primary) hypertension: Secondary | ICD-10-CM | POA: Insufficient documentation

## 2021-09-09 DIAGNOSIS — Z6838 Body mass index (BMI) 38.0-38.9, adult: Secondary | ICD-10-CM | POA: Insufficient documentation

## 2021-09-09 DIAGNOSIS — Z7982 Long term (current) use of aspirin: Secondary | ICD-10-CM | POA: Insufficient documentation

## 2021-09-09 DIAGNOSIS — E669 Obesity, unspecified: Secondary | ICD-10-CM | POA: Insufficient documentation

## 2021-09-09 DIAGNOSIS — Z96642 Presence of left artificial hip joint: Secondary | ICD-10-CM

## 2021-09-09 DIAGNOSIS — I4891 Unspecified atrial fibrillation: Secondary | ICD-10-CM | POA: Insufficient documentation

## 2021-09-09 HISTORY — PX: PR ARTHRP ACETBLR/PROX FEM PROSTC AGRFT/ALGRFT: 27130

## 2021-09-09 LAB — BASIC METABOLIC PANEL
Anion Gap: 12 (ref 7–16)
CO2: 23 mmol/L (ref 20–28)
Calcium: 9 mg/dL (ref 8.6–10.2)
Chloride: 103 mmol/L (ref 96–108)
Creatinine: 0.92 mg/dL (ref 0.67–1.17)
Glucose: 150 mg/dL — ABNORMAL HIGH (ref 60–99)
Lab: 19 mg/dL (ref 6–20)
Sodium: 138 mmol/L (ref 133–145)
eGFR BY CREAT: 96 *

## 2021-09-09 LAB — POCT GLUCOSE: Glucose POCT: 106 mg/dL — ABNORMAL HIGH (ref 60–99)

## 2021-09-09 LAB — HCT AND HGB
Hematocrit: 45 % (ref 40–51)
Hemoglobin: 15.1 g/dL (ref 13.7–17.5)

## 2021-09-09 LAB — CONFIRMATORY TYPE: Confirmatory ABORH: O POS

## 2021-09-09 LAB — MCHC: MCHC: 34 g/dL (ref 32–37)

## 2021-09-09 SURGERY — ARTHROPLASTY, HIP, TOTAL, ANTERIOR APPROACH
Anesthesia: General | Site: Hip | Laterality: Left | Wound class: Clean

## 2021-09-09 MED ORDER — TRAZODONE HCL 50 MG PO TABS *I*
50.0000 mg | ORAL_TABLET | Freq: Every evening | ORAL | Status: DC | PRN
Start: 2021-09-09 — End: 2021-09-10
  Administered 2021-09-09: 50 mg via ORAL
  Filled 2021-09-09 (×2): qty 1

## 2021-09-09 MED ORDER — OXYCODONE HCL 5 MG PO TABS *I*
5.0000 mg | ORAL_TABLET | Freq: Once | ORAL | Status: AC
Start: 2021-09-09 — End: 2021-09-09
  Administered 2021-09-09: 5 mg via ORAL
  Filled 2021-09-09: qty 1

## 2021-09-09 MED ORDER — SODIUM CHLORIDE 0.9 % FLUSH FOR PUMPS *I*
0.0000 mL/h | INTRAVENOUS | Status: DC | PRN
Start: 2021-09-09 — End: 2021-09-10

## 2021-09-09 MED ORDER — VANCOMYCIN HCL IN NACL 1500 MG/275 ML IV SOLN *I*
1500.0000 mg | Freq: Once | INTRAVENOUS | Status: AC
Start: 2021-09-09 — End: 2021-09-10
  Administered 2021-09-09: 1500 mg via INTRAVENOUS
  Filled 2021-09-09: qty 1500

## 2021-09-09 MED ORDER — SODIUM CHLORIDE 0.9 % IV SOLN WRAPPED *I*
125.0000 mL/h | Status: DC
Start: 2021-09-09 — End: 2021-09-10
  Administered 2021-09-09: 125 mL/h via INTRAVENOUS

## 2021-09-09 MED ORDER — FAMOTIDINE 20 MG PO TABS *I*
20.0000 mg | ORAL_TABLET | Freq: Two times a day (BID) | ORAL | Status: DC
Start: 2021-09-09 — End: 2021-09-10
  Administered 2021-09-09 – 2021-09-10 (×2): 20 mg via ORAL
  Filled 2021-09-09 (×2): qty 1

## 2021-09-09 MED ORDER — MAGNESIUM HYDROXIDE 400 MG/5ML PO SUSP *I*
30.0000 mL | Freq: Every day | ORAL | Status: DC | PRN
Start: 2021-09-09 — End: 2021-09-10

## 2021-09-09 MED ORDER — OXYCODONE HCL 5 MG/5ML PO SOLN *I*
5.0000 mg | Freq: Once | ORAL | Status: DC | PRN
Start: 2021-09-09 — End: 2021-09-09

## 2021-09-09 MED ORDER — ALLOPURINOL 300 MG PO TABS *I*
300.0000 mg | ORAL_TABLET | Freq: Every day | ORAL | Status: DC
Start: 2021-09-10 — End: 2021-09-10
  Administered 2021-09-10: 300 mg via ORAL
  Filled 2021-09-09 (×2): qty 1

## 2021-09-09 MED ORDER — CALCIUM CARBONATE ANTACID 500 MG PO CHEW *I*
1000.0000 mg | CHEWABLE_TABLET | Freq: Three times a day (TID) | ORAL | Status: DC | PRN
Start: 2021-09-09 — End: 2021-09-10

## 2021-09-09 MED ORDER — PROPOFOL 10 MG/ML IV EMUL (INTERMITTENT DOSING) WRAPPED *I*
INTRAVENOUS | Status: DC | PRN
Start: 2021-09-09 — End: 2021-09-09
  Administered 2021-09-09: 200 mg via INTRAVENOUS
  Administered 2021-09-09 (×2): 50 mg via INTRAVENOUS
  Administered 2021-09-09: 30 mg via INTRAVENOUS
  Administered 2021-09-09: 50 mg via INTRAVENOUS

## 2021-09-09 MED ORDER — TAMSULOSIN HCL 0.4 MG PO CAPS *I*
0.8000 mg | ORAL_CAPSULE | Freq: Every day | ORAL | Status: DC
Start: 2021-09-09 — End: 2021-09-10
  Administered 2021-09-09 – 2021-09-10 (×2): 0.8 mg via ORAL
  Filled 2021-09-09 (×2): qty 2

## 2021-09-09 MED ORDER — OXYCODONE HCL 5 MG/5ML PO SOLN *I*
10.0000 mg | Freq: Once | ORAL | Status: DC | PRN
Start: 2021-09-09 — End: 2021-09-09

## 2021-09-09 MED ORDER — BUPIVACAINE HCL 0.25 % IJ SOLUTION *WRAPPED*
Status: AC
Start: 2021-09-09 — End: 2021-09-09
  Filled 2021-09-09: qty 30

## 2021-09-09 MED ORDER — ASPIRIN 81 MG PO TBEC *I*
81.0000 mg | DELAYED_RELEASE_TABLET | Freq: Two times a day (BID) | ORAL | Status: DC
Start: 2021-09-09 — End: 2021-09-10
  Administered 2021-09-09 – 2021-09-10 (×2): 81 mg via ORAL
  Filled 2021-09-09 (×3): qty 1

## 2021-09-09 MED ORDER — FENTANYL CITRATE 50 MCG/ML IJ SOLN *WRAPPED*
INTRAMUSCULAR | Status: AC
Start: 2021-09-09 — End: 2021-09-09
  Filled 2021-09-09: qty 2

## 2021-09-09 MED ORDER — KETOROLAC TROMETHAMINE 30 MG/ML IJ SOLN *I*
INTRAMUSCULAR | Status: AC
Start: 2021-09-09 — End: 2021-09-09
  Filled 2021-09-09: qty 1

## 2021-09-09 MED ORDER — CAMPHOR-MENTHOL 0.5-0.5 % EX LOTN *I*
TOPICAL_LOTION | CUTANEOUS | Status: DC | PRN
Start: 2021-09-09 — End: 2021-09-10

## 2021-09-09 MED ORDER — OXYCODONE HCL 5 MG PO TABS *I*
10.0000 mg | ORAL_TABLET | ORAL | Status: DC | PRN
Start: 2021-09-09 — End: 2021-09-10
  Administered 2021-09-09 – 2021-09-10 (×4): 10 mg via ORAL
  Filled 2021-09-09 (×4): qty 2

## 2021-09-09 MED ORDER — EPHEDRINE 5MG/ML IN NS IV/IJ *WRAPPED*
INTRAMUSCULAR | Status: DC | PRN
Start: 2021-09-09 — End: 2021-09-09
  Administered 2021-09-09 (×5): 10 mg via INTRAVENOUS

## 2021-09-09 MED ORDER — DOCUSATE SODIUM 100 MG PO CAPS *I*
200.0000 mg | ORAL_CAPSULE | Freq: Every day | ORAL | Status: DC
Start: 2021-09-09 — End: 2021-09-10
  Administered 2021-09-09 – 2021-09-10 (×2): 200 mg via ORAL
  Filled 2021-09-09 (×2): qty 2

## 2021-09-09 MED ORDER — TRANEXAMIC ACID 1000 MG / 0.7% NACL 100 ML IVPB *I*
1000.0000 mg | Freq: Once | INTRAVENOUS | Status: DC
Start: 2021-09-09 — End: 2021-09-09
  Filled 2021-09-09: qty 100

## 2021-09-09 MED ORDER — MELOXICAM 7.5 MG PO TABS *I*
15.0000 mg | ORAL_TABLET | Freq: Every day | ORAL | Status: DC
Start: 2021-09-10 — End: 2021-09-10
  Administered 2021-09-10: 15 mg via ORAL
  Filled 2021-09-09: qty 2

## 2021-09-09 MED ORDER — ACETAMINOPHEN 500 MG PO TABS *I*
1000.0000 mg | ORAL_TABLET | Freq: Three times a day (TID) | ORAL | Status: DC
Start: 2021-09-09 — End: 2021-09-10
  Administered 2021-09-09 – 2021-09-10 (×3): 1000 mg via ORAL
  Filled 2021-09-09 (×3): qty 2

## 2021-09-09 MED ORDER — DOXYCYCLINE HYCLATE 100 MG PO TABS *I*
100.0000 mg | ORAL_TABLET | Freq: Two times a day (BID) | ORAL | Status: DC
Start: 2021-09-09 — End: 2021-09-10
  Administered 2021-09-09 – 2021-09-10 (×2): 100 mg via ORAL
  Filled 2021-09-09 (×2): qty 1

## 2021-09-09 MED ORDER — PHENYLEPHRINE 100 MCG/ML IN NS 10 ML *WRAPPED*
INTRAMUSCULAR | Status: DC | PRN
Start: 2021-09-09 — End: 2021-09-09
  Administered 2021-09-09: 100 ug via INTRAVENOUS
  Administered 2021-09-09 (×2): 200 ug via INTRAVENOUS
  Administered 2021-09-09 (×2): 100 ug via INTRAVENOUS
  Administered 2021-09-09: 200 ug via INTRAVENOUS

## 2021-09-09 MED ORDER — CEFAZOLIN 1000 MG IN STERILE WATER 10ML SYRINGE *I*
PREFILLED_SYRINGE | INTRAVENOUS | Status: DC | PRN
  Administered 2021-09-09: 2000 mg via INTRAVENOUS

## 2021-09-09 MED ORDER — LACTATED RINGERS IV SOLN *I*
20.0000 mL/h | INTRAVENOUS | Status: DC
Start: 2021-09-09 — End: 2021-09-09
  Administered 2021-09-09: 20 mL/h via INTRAVENOUS

## 2021-09-09 MED ORDER — LIDOCAINE HCL (PF) 1 % IJ SOLN *I*
0.1000 mL | INTRAMUSCULAR | Status: DC | PRN
Start: 2021-09-09 — End: 2021-09-09
  Administered 2021-09-09: 0.1 mL via SUBCUTANEOUS
  Filled 2021-09-09: qty 2

## 2021-09-09 MED ORDER — FENTANYL CITRATE 50 MCG/ML IJ SOLN *WRAPPED*
INTRAMUSCULAR | Status: DC | PRN
Start: 2021-09-09 — End: 2021-09-09
  Administered 2021-09-09: 25 ug via INTRAVENOUS

## 2021-09-09 MED ORDER — CEFAZOLIN 2000 MG IN STERILE WATER 20ML SYRINGE *I*
2000.0000 mg | PREFILLED_SYRINGE | Freq: Three times a day (TID) | INTRAVENOUS | Status: AC
Start: 2021-09-09 — End: 2021-09-10
  Administered 2021-09-09 – 2021-09-10 (×2): 2000 mg via INTRAVENOUS
  Filled 2021-09-09 (×2): qty 2000

## 2021-09-09 MED ORDER — TRANEXAMIC ACID 1000 MG/10ML IV SOLN *I*
INTRAVENOUS | Status: DC | PRN
Start: 2021-09-09 — End: 2021-09-09
  Administered 2021-09-09: 1000 mg via INTRAVENOUS

## 2021-09-09 MED ORDER — MIDAZOLAM HCL 1 MG/ML IJ SOLN *I* WRAPPED
INTRAMUSCULAR | Status: DC | PRN
Start: 2021-09-09 — End: 2021-09-09
  Administered 2021-09-09: 2 mg via INTRAVENOUS

## 2021-09-09 MED ORDER — DEXAMETHASONE SODIUM PHOSPHATE 4 MG/ML INJ SOLN *WRAPPED*
INTRAMUSCULAR | Status: DC | PRN
Start: 2021-09-09 — End: 2021-09-09
  Administered 2021-09-09: 4 mg via INTRAVENOUS

## 2021-09-09 MED ORDER — TRANEXAMIC ACID 1000 MG / 0.7% NACL 100 ML IVPB *I*
1000.0000 mg | Freq: Once | INTRAVENOUS | Status: DC
Start: 2021-09-09 — End: 2021-09-09

## 2021-09-09 MED ORDER — SODIUM CHLORIDE 0.9 % IV SOLN WRAPPED *I*
20.0000 mL/h | Status: DC
Start: 2021-09-09 — End: 2021-09-09

## 2021-09-09 MED ORDER — ONDANSETRON HCL 2 MG/ML IV SOLN *I*
4.0000 mg | Freq: Four times a day (QID) | INTRAMUSCULAR | Status: DC | PRN
Start: 2021-09-09 — End: 2021-09-10

## 2021-09-09 MED ORDER — ONDANSETRON HCL 2 MG/ML IV SOLN *I*
INTRAMUSCULAR | Status: DC | PRN
Start: 2021-09-09 — End: 2021-09-09
  Administered 2021-09-09: 4 mg via INTRAVENOUS

## 2021-09-09 MED ORDER — ONE STEP DOSE TO ADMINISTER *I*
INTRAMUSCULAR | Status: DC | PRN
Start: 2021-09-09 — End: 2021-09-09
  Administered 2021-09-09: 31 mL via INTRA_ARTICULAR

## 2021-09-09 MED ORDER — DEXTROSE 5 % FLUSH FOR PUMPS *I*
0.0000 mL/h | INTRAVENOUS | Status: DC | PRN
Start: 2021-09-09 — End: 2021-09-10

## 2021-09-09 MED ORDER — DILTIAZEM HCL COATED BEADS 120 MG PO CP24 *I*
120.0000 mg | ORAL_CAPSULE | Freq: Every day | ORAL | Status: DC
Start: 2021-09-09 — End: 2021-09-10
  Administered 2021-09-09 – 2021-09-10 (×2): 120 mg via ORAL
  Filled 2021-09-09 (×3): qty 1

## 2021-09-09 MED ORDER — LIDOCAINE HCL 2 % IJ SOLN *I*
INTRAMUSCULAR | Status: DC | PRN
Start: 2021-09-09 — End: 2021-09-09
  Administered 2021-09-09: 100 mg via INTRAVENOUS

## 2021-09-09 MED ORDER — ACETAMINOPHEN 500 MG PO TABS *I*
1000.0000 mg | ORAL_TABLET | Freq: Once | ORAL | Status: AC
Start: 2021-09-09 — End: 2021-09-09
  Administered 2021-09-09: 1000 mg via ORAL
  Filled 2021-09-09: qty 2

## 2021-09-09 MED ORDER — ESMOLOL HCL 10 MG/ML IV SOLN *I*
INTRAVENOUS | Status: DC | PRN
Start: 2021-09-09 — End: 2021-09-09
  Administered 2021-09-09: 10 mg via INTRAVENOUS

## 2021-09-09 MED ORDER — LACTATED RINGERS IV SOLN *I*
125.0000 mL/h | INTRAVENOUS | Status: DC
Start: 2021-09-09 — End: 2021-09-09
  Administered 2021-09-09: 125 mL/h via INTRAVENOUS

## 2021-09-09 MED ORDER — CELECOXIB 200 MG PO CAPS *I*
200.0000 mg | ORAL_CAPSULE | Freq: Once | ORAL | Status: AC
Start: 2021-09-09 — End: 2021-09-09
  Administered 2021-09-09: 200 mg via ORAL
  Filled 2021-09-09: qty 1

## 2021-09-09 MED ORDER — POVIDONE-IODINE 5 % EX SOLN *I*
Freq: Once | CUTANEOUS | Status: AC
Start: 2021-09-09 — End: 2021-09-09

## 2021-09-09 MED ORDER — LACTATED RINGERS IV SOLN *I*
INTRAVENOUS | Status: DC | PRN
Start: 2021-09-09 — End: 2021-09-09

## 2021-09-09 MED ORDER — SENNOSIDES 8.6 MG PO TABS *I*
2.0000 | ORAL_TABLET | Freq: Every evening | ORAL | Status: DC
Start: 2021-09-09 — End: 2021-09-10
  Administered 2021-09-09: 2 via ORAL
  Filled 2021-09-09: qty 2

## 2021-09-09 MED ORDER — HALOPERIDOL LACTATE 5 MG/ML IJ SOLN *I*
0.5000 mg | Freq: Once | INTRAMUSCULAR | Status: DC | PRN
Start: 2021-09-09 — End: 2021-09-09

## 2021-09-09 MED ORDER — BUPIVACAINE IN DEXTROSE 0.75-8.25 % IT SOLN *I*
INTRATHECAL | Status: DC | PRN
Start: 2021-09-09 — End: 2021-09-09
  Administered 2021-09-09: 1.4 mL via INTRATHECAL

## 2021-09-09 MED ORDER — CHLORHEXIDINE GLUCONATE 0.12 % MT SOLN *STORES SUPPLIED*
15.0000 mL | Freq: Once | OROMUCOSAL | Status: AC
Start: 2021-09-09 — End: 2021-09-09
  Administered 2021-09-09: 15 mL via OROMUCOSAL

## 2021-09-09 MED ORDER — MIDAZOLAM HCL 1 MG/ML IJ SOLN *I* WRAPPED
INTRAMUSCULAR | Status: AC
Start: 2021-09-09 — End: 2021-09-09
  Filled 2021-09-09: qty 2

## 2021-09-09 MED ORDER — POLYETHYLENE GLYCOL 3350 PO PACK 17 GM *I*
17.0000 g | PACK | Freq: Every day | ORAL | Status: DC
Start: 2021-09-09 — End: 2021-09-10
  Administered 2021-09-09 – 2021-09-10 (×2): 17 g via ORAL
  Filled 2021-09-09 (×2): qty 17

## 2021-09-09 MED ORDER — VANCOMYCIN HCL IN NACL 1500 MG/275 ML IV SOLN *I*
1500.0000 mg | Freq: Once | INTRAVENOUS | Status: AC
Start: 2021-09-09 — End: 2021-09-09
  Administered 2021-09-09: 1500 mg via INTRAVENOUS
  Filled 2021-09-09: qty 275

## 2021-09-09 MED ORDER — OXYCODONE HCL 5 MG PO TABS *I*
5.0000 mg | ORAL_TABLET | ORAL | Status: DC | PRN
Start: 2021-09-09 — End: 2021-09-10

## 2021-09-09 SURGICAL SUPPLY — 43 items
ADHESIVE SKIN CLOSURE 0.7ML DERMABOND ADVANCED (Supply) ×4 IMPLANT
APPLICATOR CHLORAPREP STER  ORANGE 26ML (Supply) ×6 IMPLANT
BLADE SAGITTAL 18.5 X 1.24 X 83MM (Supply) ×2 IMPLANT
BLADE SURG CARBON STEEL #10 STER (Supply) ×2 IMPLANT
CUP ACET DIA58MM HIP GRIPTION PRI CEMENTLESS FIX SECT SER PINN (Implant) ×2 IMPLANT
DRAPE C-ARM 42 IN X 120 IN (Supply) ×2 IMPLANT
DRAPE ORTH W47XL51IN SPL W7XL18IN CLR PLAS U SHP ADH STERI-DRP (Drape) ×4 IMPLANT
DRAPE SUR W77XL120IN SMS SPL W/ ADH DISP (Drape) ×2 IMPLANT
DRAPE SURG IOBAN 2 ANTIMIC LG 23 X 17IN (Drape) ×2 IMPLANT
DRESSING MEPILEX POST OP 4X10 (Dressing) ×2 IMPLANT
ELECTRODE EXT BLADE SS 6.5IN (Supply) ×4 IMPLANT
ELIMINATOR H APEX FOR 48-60MM PINN HIP SHELL (Pin) ×2 IMPLANT
FILTER NEPTUNE 4PORT MANIFOLD (Supply) ×2 IMPLANT
GLOVE BIOGEL PI MICRO IND UNDER SZ 7.5 LF (Glove) ×2 IMPLANT
GLOVE BIOGEL PI ORTHOPRO SZ 7.5 LF (Glove) ×6 IMPLANT
HANDPIECE INTERPULSE W/COAXIAL BONE (Supply) ×2 IMPLANT
HEAD DELTA CERAMIC 36MM SZ 1.5 (Implant) ×2 IMPLANT
HOOD SURGICAL W/PEEL AWAY FACE SHIELD T7PLUS (Supply) ×2 IMPLANT
LINER ACETABULAR ALTREX +4 34X58 NEUTRAL (Implant) ×2 IMPLANT
NEEDLE QUINCKE SPI 18G X 3.5IN (Needle) ×2 IMPLANT
PACK CUSTOM ANTERIOR TOTAL HIP (Pack) ×2 IMPLANT
PACKING GAUZE X-RAY 1IN X 6FT STER (Dressing) ×2 IMPLANT
PEN SURG MARKER REG TIP (Supply) ×2 IMPLANT
PENCIL ROCKER SWITCH W/HOLSTER SS DISP (Supply) ×2 IMPLANT
SCREW BONE CANCEL PINCLE 20MM SS (Screw) ×2 IMPLANT
SLEEVE COMP KNEE MED BLENDED (Supply) ×2 IMPLANT
SOL SOD CHL IV .9PCT 1000ML BAG (Drug) ×2 IMPLANT
SOL SODIUM CHLORIDE IRRIG 1000ML BTL (Solution) ×2 IMPLANT
SOL WATER IRRIG STERILE 1000ML BTL (Solution) ×4 IMPLANT
SPIKE MINI DISPENSING PIN (BBRAUN DP1000) (Supply) ×2 IMPLANT
STEM FEMORAL ACTIS HIGH COLLAR SZ 8 (Implant) ×2 IMPLANT
SUCTION YANKAUER HIGH CAP. (Supply) ×2 IMPLANT
SUTR ETHIBOND 5 V40 30 IN GREEN (Suture) ×2 IMPLANT
SUTR STRATAFIX SPIRAL MONO 3-0 PS-1 45CM (Suture) ×2 IMPLANT
SUTR VICRYL ANTIB 1 CTX 36 VIOLET (Suture) ×6 IMPLANT
SUTR VICRYL ANTIB 2-0 CP-2 27 UNDY (Suture) ×6 IMPLANT
SUTR VICRYL ANTIB BRD 2-0 CP-2 18 UNDYED (Suture) ×2 IMPLANT
SUTURE ETHLN SZ 2-0 L18IN NONABSORBABLE BLK L26MM FS 3/8 CIR REV CUT NDL POLYAMIDE 6 MFIL (Suture) ×4 IMPLANT
SYRINGE IRRIG 60ML BULB SOFT PLIABLE ONE HANDED USE W/TYVEK LID (Syringe) ×2 IMPLANT
SYRINGE LUERLOCK 30ML INDIVIDUAL WRAP (Syringe) ×2 IMPLANT
SYRINGE TB SAFETYGLIDE 1CC 27G X 1/2IN (Syringe) ×2 IMPLANT
SYSTEM SURGIPHOR ANTIMICROBIAL IRRIGATION (Supply) ×2 IMPLANT
TAPE SUR W3INXL5.5YD WHT E FOAM HYPOALRG STRETCHED MICFOAM (Dressing) ×2 IMPLANT

## 2021-09-09 NOTE — Discharge Instructions (Addendum)
Surgical Date: 09/09/2021 TOTAL HIP ARTHROPLASTY    Helpful Telephone Numbers:  Nurse Navigator: [phone] Monday through Friday 8 am to 4:30 pm (for questions and concerns)  After Hours: [phone] (weekends and M-F after 4:30pm) (for questions and concerns that need to be addressed after office hours)  Appointment line: [phone] make, change, or schedule an appointment)  Surgeons office: Zella Ball - Office Phone [phone]    You or your caregiver should call your surgeon's nurse navigator's line at [phone] for:  Redness around the incisions   Continuous drainage or bleeding from incision sites (a small amount of drainage is expected initially)  Temperature above 101 F  Pain not controlled by prescribed pain medications   Any other worrisome condition    If you feel you are having a Medical Emergency please present to the [facility] Emergency Department    You or your caregiver should call your surgeons office for any prescription refill requests.    After hours and on weekends there is always someone on call, available through the answering service [phone] to address your concerns.  Please don't hesitate to reach out with your questions.   In the event that you are unable to contact the office or the answering service, please go to [facility] Emergency Department for evaluation for urgent concerns.    CareSense Questionnaire Reminder  Two and four days after surgery, you will receive questionnaires through CareSense. Please complete them both using the app or the link in that day's CareSense email. Your Nurse Navigators will be checking in with you as they were before surgery, and your responses will help them assess your progress.    Thank you for choosing [facility] for your joint replacement.  Please adhere to the following instructions in order to maximize your recovery.    Please follow the instructions next to any check box that has  been checked i.e. [x]     Activity:  You should be weight bearing as tolerated on your operative extremity. Walk and stair-climb as tolerated.   Use assistive devices (walker, crutches, cane) as instructed by your Physical Therapist.   Do not lift, carry, push or pull objects heavier than 10 pounds until cleared by your surgeon.   Driving is permitted once you are functionally cleared to do so by your surgeon AND you are no longer taking narcotic pain medications.         [x]  You have NO hip precautions.     Diet:  You may resume your previous diet, as tolerated.   Please aim for protein intake of approximately 100 grams per day to promote healing. (Meats, fish, peanut butter, and Ensure/Boost shakes are all good sources of protein.)  If you feel nauseous or develop vomiting, go back to just clear liquids, increasing to a soft diet and then regular solid foods as tolerated.  Smoking and/or nicotine use are associated with poor wound healing and increased risk of infection. Therefore it is recommended you not smoke or use nicotine for as long as possible following surgery.  If you are diabetic, it is important to keep your blood sugars well-controlled. Poorly controlled blood sugars are associated with poor wound healing and increased risk of infection.     Labs:  [x]    None required.      Preventing Blood Clots (Deep Vein Thrombosis/Pulmonary Embolism or DVT/PE):  Please wear your anti-embolism stockings for 14 days. They should be worn during the day and removed at  night - unless instructed otherwise.    You will be prescribed a blood thinner to help minimize the increased risk of blood clots following surgery. Please refer to your MEDICATION LIST for details. While taking blood thinners, you may find you bruise or develop nose bleeds more easily. You should watch for signs of gastrointestinal bleeding, which may include dark, tar-like stools or vomit with the appearance of coffee-grounds. You should call your  surgeon if you notice these signs.   You have been instructed to take 81 mg Aspirin twice daily for 4 weeks for deep vein thrombosis prophylaxis (blood clot prevention).     [x]   Please do not continue your previous aspirin therapy of once daily until you have completed the full 4 week duration of DVT prevention medication with aspirin twice daily. After 4 weeks, you may return to your previous dose and frequency (number of times a day) of 81 mg once daily.    Post-operative course:  Swelling:  It is common to have swelling up to 6 to 12 months after surgery depending on your activity.    Bruising:  It is common and normal for bruising to occur post-operatively.  Bruising can develop on your thigh, calf, foot and ankle following surgery.  Bruises can be painful to touch as they resolved and can take several weeks to disappear.  Ice & Elevation:  Icing is essential for pain/swelling reduction and should be continued as needed for months after surgery.   Apply ice for 20-30 minutes at a time, with a minimum of a 30-minute break in between the icing sessions.   Combine icing with elevation of your extremity.   Elevate your foot on pillows so your foot is above the level of your heart and your ankle is above your knee.   True elevation of the hip joint in this manner is challenging, but elevation of the extremity will help with your recovery regardless.     Compression:  Frequent use of wearing an ACE wrap is strongly encouraged to help reduce swelling post-operatively.  For 2 weeks after surgery, use an ACE wrap on your surgical leg for a maximum of 12 hours per pay.  Adjust ACE wrap as needed if it feel too tight or too loose.     Managing your pain:  Please refer to your MEDICATION LIST for detailed instructions on your personalized pain protocol.  You should apply ice for 30 minutes every 1-2 hours and elevate your extremity above the level of your heart 3 times a day and during rest to help reduce swelling and  pain.  Narcotic medications are often necessary for a short time after total joint surgery. While you are taking narcotics:  Do NOT drive or operate heavy machinery.  Do NOT drink alcohol.  Do NOT make any important personal, business, or economic decisions or sign legal documents.  Narcotics can cause side effects such as sedation, lethargy, body-wide itching, nausea, vomiting and constipation.  You should work to gradually wean off of narcotic pain medications as your pain improves, first by decreasing the dose, and then by increasing the amount of time in between doses.    It is important to dispose of all narcotics when you are certain you no longer need them. The first choice for all drug disposal is an authorized medication disposal bin, which is located at the [facility] pharmacy, most retail pharmacies, and law enforcement stations. If you are unable to access one of these bins, you  should flush unused narcotics down the toilet.  Please call your surgeon's office M-F before 4:30pm for prescription refill requests    Stomach irritation prevention:        [x]   While you are on Aspirin therapy to prevent blood clots, you have been instructed to take pepcid (famotidine) twice a day or a PPI (protonix, prevacid etc) daily to help reduce gastric irritation and protect   your stomach lining as aspirin can be abrasive to the stomach               [x]   While you are on NSAID (mobic/celebrex) therapy for pain control, you have been instructed to take pepcid (famotidine) twice a day, or a PPI (protonix, prevacid etc) daily to help reduce gastric irritation and protect your stomach lining as these medications can be abrasive to the stomach     Constipation:  After surgery you are at increased risk for constipation because of reduced movement and activity levels as well as opioids/narcotics.   To prevent/alleviate constipation, you should walk as much as you can tolerate and drink plenty of fluids. You should also  use a stool softener such as Colace (docusate sodium) and a laxative such as Senokot (senna) unless you develop diarrhea or loose stool.  These two medications have been prescribed to you.  See your MEDICATION LIST for details.    If you have not had a bowel movement by your 3rd day after surgery, please consider adding Miralax, or taking magnesium citrate to help facilitate a bowel movement. Utilize your pharmacist, or please call your PCP/surgeon's office, if you have any concerns about the addition of these medications to your current home regimen.    Caring for your incision:     MEPILEX:   Your incision is covered with a surgical dressing called Mepilex.  Leave this dressing in place for 7 days. After seven days this may be removed or you may leave the Mepilex dressing on for up to 2 weeks if not soiled or peeling. As long as there is no drainage your incision may be left open to air. If there is drainage cover with a clean dry dressing and contact your surgeon.   Do not shower if you have drainage from your incision. You may sponge bathe. Do not scrub your incision.  Initially you may shower with intact dressing in place. If there's any sign that the dressing is peeling at any corner, or wrinkled, cover with plastic wrap before showering. Do not soak your wound in a bath, whirlpool, or in any kind of standing water. When the initial dressing is removed and if there is no drainage you may continue to shower. Pat your incision dry, no not scrub the incision site.   You have surgical sutures that require removal at your follow-up visit.   If your bandage has standing fluid beneath it, is >80% soiled, or has become mostly unattached from the skin, remove and cover with a dry dressing and tape until follow-up. Do not shower and do not allow incision to get wet.     Follow-up:  Call your surgeon's office at [phone] to make a follow-up appointment.        Your surgeon requests that you do not receive the Flu or  Covid vaccine until 4 weeks have passed since your surgical date    Antibiotics:  Please take doxycycline 100mg  twice daily x 2 weeks to help prevent surgical site infection. Please take this medication as prescribed.  It is important to take this medication with a full glass of water to avoid esophageal irriation. Avoid sun exposure. Review and follow rest of antibiotic insert education as well.

## 2021-09-09 NOTE — Plan of Care (Signed)
Patient was accepted to [facility]. 'AMB Referral to [facility]' order must be pended if placed in advance and only signed on day of discharge.     HOME CARE AGENCY CHOICE    Medicare regulations require that hospitals advise patients that they may choose to receive services from any home health agency.  Patient/family choice of agency is noted with an X by that agency's name.      Agencies: [x]   [facility]       []   [facility]       []   [facility]      []   [facility]        []   Other ____________________      Home Care Agency Representative alerted:    Name: InBasket  Date: 8/3  Time:       *[facility] along with [facility], [facility] and [facility] are all part of the [place] [facility] [place]'s health care system.

## 2021-09-09 NOTE — Plan of Care (Signed)
Problem: Impaired Bed Mobility  Goal: STG - IMPROVE BED MOBILITY  Note: Patient will perform bed mobility without rails and the head of bed flat with No assist (Independently)     Time frame: 1-3 Visits     Problem: Impaired Transfers  Goal: STG - IMPROVE TRANSFERS  Note: Patient will complete Sit to stand transfers using a rolling walker with Modified independence     Time frame: 1-3 Visits     Problem: Impaired Ambulation  Goal: STG - IMPROVE AMBULATION  Note: Patient will ambulate 150-299 feet using a rolling walker with Modified independence    Time frame: 1-3 Visits       Problem: Impaired Stair Navigation  Goal: STG - IMPROVE STAIR NAVIGATION  Note: Patient will navigate 4-7 steps with 1  rail(s) and least restrictive assistive device and Supervision of 1    Time frame: 1-3 Visits

## 2021-09-09 NOTE — Anesthesia Procedure Notes (Signed)
---------------------------------------------------------------------------------------------------------------------------------------    AIRWAY   GENERAL INFORMATION AND STAFF    Patient location during procedure: OR       Date of Procedure: 09/09/2021 10:49 AM  CONDITION PRIOR TO MANIPULATION     Current Airway/Neck Condition:  Normal        For more airway physical exam details, see Anesthesia PreOp Evaluation  AIRWAY METHOD     Patient Position:  Sniffing    Preoxygenated: yes      Induction: IV    Mask Difficulty Assessment:  0 - not attempted    Number of Attempts at Approach:  1    Number of Other Approaches Attempted:  0  FINAL AIRWAY DETAILS    Final Airway Type:  LMA    Final LMA: classic    LMA Size: 5  ----------------------------------------------------------------------------------------------------------------------------------------

## 2021-09-09 NOTE — Anesthesia Case Conclusion (Signed)
CASE CONCLUSION  [facility]  Actions:  LMA removed, suctioned and OPA  Criteria Used for Airway Removal:  Adequate Tv & RR and acceptable O2 saturation  Assessment:  Routine  Transport  Directly to: PACU  Airway:  Facemask  Oxygen Delivery:  10 lpm  Position:  Recumbent  Patient Condition on Handoff  Level of Consciousness:  Moderately sedated  Patient Condition:  Stable  Handoff Report to:  RN

## 2021-09-09 NOTE — Progress Notes (Addendum)
Physical Therapy    Treatment session completed.  Pt has cleared PT. Patient provided with written HEP and walking protocol. Patient verbalizes understanding of walking program, demonstrates understanding of HEP exercises.        Is another physical therapy session necessary prior to hospital discharge:  No    Discharge recommendation:  Intermittent supervision/assist, Anticipate return to prior living arrangement, Home PT  Equipment recommendations upon discharge: None  Mobility recommendations for nursing while in hospital: SBA with RW     PT Adult Assessment - 09/09/21 1725        PT Tracking    PT TRACKING PT Assigned     Type of Session follow up/treatment     SW Request? No        Treatment Day    Treatment Day (HH / URR) 2        Precautions/Observations    Precautions used Yes     Total Hip Replacement Surgical Approach: Anterior;No hip precautions per MD order     Weight Bearing Status Left Lower Extremity - Weight bearing as tolerated     LDA Observation None     Isolation Precautions Contact     Was patient wearing a mask? No     PPE worn by Clinical research associate Gloves;Mask;Gown     Fall Precautions General falls precautions;Patient educated to use call light for nursing assist prior to getting up        Pain Assessment    *Is the patient currently in pain? Yes     Pain (Before,During, After) Therapy During     0-10 Scale 3     Pain Location Leg     Pain Orientation Left;Anterior     Pain Descriptors Sore     Pain Intervention(s) Repositioned;Cold applied     Additional comments hematoma on L thigh, ortho aware        Vision     Current Vision Adequate for PT session        Cognition    Cognition Tested     Arousal/Alertness Appropriate responses to stimuli     Memory Appears intact     Orientation Alert and oriented x4     Ability to Follow Instructions Follows all commands and directions without difficulty     Safety Judgement Intact        Bed Mobility    Bed mobility Tested     Supine to Sit Independent     Sit to  Supine Independent        Transfers    Transfers Tested     Sit to Stand Modified independent (device)     Stand to sit Modified independent (device)     Transfer Assistive Device rolling walker     Additional comments 2x from EOB to also practice sit to stand exercises        Mobility    Mobility Tested     Weight Bearing Status Left Lower Extremity - Weight bearing as tolerated     Gait Pattern 2 point;Antalgic left side;Decreased cadence     Ambulation Assist Modified independent (device)     Ambulation Distance (Feet) 300     Ambulation Assistive Device rolling walker     Stairs Assistance Modified independent (device)     Stair Management Technique One rail;Step to pattern;Forwards     Number of Stairs 5     Additional comments pt remembers step to pattern from last surgery. No overt LOB  Family/Caregiver Training`    Patient/Family/Caregiver training Yes     Patient training Role of physical therapy in hospital and plan for evaluation and follow up;Discharge planning;PT plan of care after evaluation;Purpose and importance of icing and recommendations for ice frequency and duration;Benefits of oob activity and mobility during hospitalization, including frequency of ambulation and change of position;Recommendation to increase oob activity and ambulation with nursing while in hospital;Call don't fall, purpose of bed/chair alarm, and recommendation for nursing assistance during all oob mobility        Therapeutic Exercises    Additional comments reviewed HEP and writer demonstrated with pt and pt receptive. Hand out given        Balance    Balance Tested     Sitting - Static Independent;Unsupported     Sitting - Dynamic Independent;Unsupported     Standing - Static Independent;Supported     Standing - Dynamic Independent;Supported        Functional Outcome Measures    Functional Outcome Measures Yes        PT AM-PAC Mobility    Turning over in bed? None     Moving from lying on back to sitting on the side of  the bed? None     Moving to and from a bed to a chair? None     Sitting down on and standing up from a chair with arms? None     Need to walk in hospital room? None     Climbing 3 - 5 steps with a railing? None     Total Raw Score 24     Standardized Score - Calculated 61.14     % Functional Impairment - Calculated 0%        Assessment    Brief Assessment Remains appropriate for skilled therapy     Problem List Impaired transfers;Impaired ambulation;Impaired bed mobility;Impaired functional status;Pain contributing to impairment     Patient / Family Goal home        Plan/Recommendation    PT Treatment Interventions Restorative PT     PT Frequency 5-7x/wk     PT Mobility Recommendations SBA with RW     PT Referral Recommendations OT;SW;Home care     PT Discharge Recommendations Intermittent supervision/assist;Anticipate return to prior living arrangement;Home PT     PT Discharge Equipment Recommended None     PT Assessment/Recommendations Reviewed With: Patient;Nursing;Family     Next PT Visit Pt cleared PT     PT needs to see patient prior to DC  No        Time Calculation    PT Timed Codes 15     PT Untimed Codes 0     PT Total Treatment 15        Plan and Onset date    Plan of Care Date 09/09/21     Onset Date 09/09/21     Treatment Start Date 09/09/21               AMPAC Score Interpretation:  Adapted from Daniel L. Young et al [employer] learning prediction of hospital patient need for post-acute care using admission mobility measure is robust across patient diagnoses. Health Policy and Technology, volume 12, issue 2, June 2023, 100754       Patients 66 years or older with an AM-PAC standardized score of <31 have an increased likelihood of requiring post-acute care (76%)    Patients that are less than 55 years old with an AM-PAC standardized score of <31 (  41-59%) or, patients that are>61 years old with an AM-PAC standardized score >/=31 (37-50%) have a moderate likelihood of requiring post-acute care     Patients  with an AM-PAC standardized score of >43 have an increased likelihood for discharge to home regardless of age. (5-12%)    Johann Capers PT, DPT

## 2021-09-09 NOTE — Anesthesia Procedure Notes (Addendum)
---------------------------------------------------------------------------------------------------------------------------------------    NEURAXIAL BLOCK PLACEMENT  Single Shot Spinal    Date of Procedure: 09/09/2021 10:53 AM    Patient Location:  OR    Reason for Block: at surgeon's request    CONSENT AND TIMEOUT     Consent:  Obtained per policy  METHOD:    Patient Position: sitting    Monitoring: blood pressure and continuous pulse oximetry      Labeling: syringes appropriately labeled    Sedation Used: no            For medications used, please see MAR        Level of Sedation: none      Sterile Technique:  Hand sanitizing, hat, mask and sterile gloves    Prep: aseptic technique per protocol, povidone-iodine and site draped      Successful Approach: midline    Successful Location: L3-4    Attempts (Skin Punctures):  2    Attempted Approach:  midline    Attempted Location: L3-4  SPINAL NEEDLE:    Type: Quincke    Gauge: 22 G    Length: 3.5 in    CSF Appearance: clear  OBSERVATIONS:    Block Completion:  Successfully completed      Paresthesia: none      Neuraxial Blood: blood not aspirated      Sensory Levels: bilateral        Motor Block: dense      Patient Reaction to Block: tolerated procedure well and vitals remained stable    STAFF     Performed by  Lauro Regulus, MD  ----------------------------------------------------------------------------------------------------------------------------------------

## 2021-09-09 NOTE — Op Note (Signed)
Operative Note (Surgical Case/Log ID: [id number])       Date of Surgery: 09/09/2021       Surgeons: Moishe Spice) and Role:     * Zella Ball, MD - Primary     * Wynn Banker, MD - Resident - Assisting   Assistants:         Pre-op Diagnosis: Pre-Op Diagnosis Codes:     * Primary osteoarthritis of left hip [M16.12]       Post-op Diagnosis: Post-Op Diagnosis Codes:     * Primary osteoarthritis of left hip [M16.12]       Procedure(s) Performed: Procedure(s) (LRB):  LEFT ARTHROPLASTY, HIP, TOTAL, ANTERIOR APPROACH (Left)       Anesthesia Type: Anesthesia type not filed in the log.        Fluid Totals: I/O this shift:  08/09 0700 - 08/09 1459  In: 1000 (7.4 mL/kg) [I.V.:1000]  Out: - (0 mL/kg)   Net: 1000  Weight: 134.3 kg        Estimated Blood Loss: * No values recorded between 09/09/2021  9:46 AM and 09/09/2021 11:32 AM *       Specimens to Pathology:  * No specimens in log *       Temporary Implants:        Packing:                 Patient Condition: good       Indications: L hip osteoarthritis severe       Findings (Including unexpected complications): Full thickness cartilage loss femoral head and acetabulum     Description of Procedure: 09/09/2021  Operative Note    Attending Surgeon: Tempie Hoist MD  Assistants: Wynn Banker MD  Pre-operative Diagnosis:  Left hip end-stage degenerative joint disease  Post-operative Diagnosis:  Left hip end-stage degenerative joint disease  Procedure Performed: Left total hip arthroplasty  Anesthesia: Spinal with general  Implants Used:   1. Size 58 Depuy pinacle acetabular shell, 1x 6.56mm cancellous bone screw  2. Size 8 depuy actis high offset femoral component  3. 36mm + 4 polyethylene liner  4. 36mm + 1.5 biolox femoral head  Estimated Blood Loss: 300 cc  Specimens: none   Complications: none  Disposition: PACU in stable condition      Indications:  Jorge Boyd is a 61 y.o. male with a long-standing history of severe left hip degenerative joint disease,  refractory to conservative management and is also causing debilitating loss of function and disruption of satisfactory lifestyle. The patient has requested this surgical procedure in an attempt to alleviate the aforementioned complaints. The patient has acknowledged that risks are associated with the procedure and has expressed satisfaction that they are willing to accept such risks in view of the potential benefits of the offered surgical procedure.  Risks discussed include, but are not limited to, dislocation, leg length inequality, fracture of femur or acetabulum, infection, injury to nerves (lateral femoral cutaneous, sciatic, femoral) or blood vessels, need for revision surgery, deep vein thrombosis, pulmonary embolism, perioperative medical or anesthetic complications.   The patient has acknowledged repeated and ample opportunity to discuss risk and has had questions addressed by myself. Preoperative medical clearance was obtained to proceed with the elective procedure.    [name] was identified in the preoperative holding area by Dr. Darrol Angel.  Once again, risks, benefits, and alternatives to surgery were briefly reviewed.  The left hip was marked as the correct operative site. Informed consent was signed by Mr.  Cieslinski .  All final questions were addressed at this time.  [name] was seen by the anesthesiologist who determined it would be safe to proceed with surgery.    [name] was transferred to the operating room where a spinal anesthetic with general anesthesia was satisfactorily applied. [name] was then positioned in the supine position on the Hana table with their feet in well padded boots, taking care to pad all down structures. TED stocking and SCDs placed on the right leg for mechanical DVT prophylaxis during the procedure. The right arm was positioned on a well-padded arm board, and left arm positioned across the chest on pillows draped above in a position of  comfort.    The left leg was then prepped using a chlorhexidine scrub followed by alcohol solution, and finally by chlorhexidine applicator sticks. The left leg was then draped in the usual sterile fashion.    A timeout was held once again confirming the procedure performed, the correct operative site, that preoperative antibiotics consisting of ancef 2g IV, vancomycin 1.5g IV, and 1g IV tranexamic acid were administered, that all appropriate implants were available for the procedure, that all necessary imaging was available for the procedure, and that all safety precautions were being utilized.    A 10cm skin incision was made in line with the tensor muscle fibers and extended distally using a #10 scalpel.  Dissection was carried sharply to the level of the fascia of the tensor.  The fascia was then sharply divided over the tensor muscle with care to avoid any injury to traversing branches of the lateral femoral cutaneous nerve. The interval between the tensor fascia muscle and sartorius was identified and entered bluntly.  The gluteus minimus was gently dissected from the superior capsule and a blunt retractor was placed proximal to the femoral neck.  The branches of the lateral circumflex vessels were identified and cauterized extensively.  The rectus femoris was identified and gently retracted medially.  A blunt retractor was then placed inferior to the femoral neck.  An L-shaped capsulotomy was performed and a suture placed at the proximal corner for later identification and repair.  The capsulotomy was carried distally to the level of the lesser trochanter, and this enabled full exposure of the hip joint.  The synovial fluid encountered was clear.  The superior capsule in the piriformis recess was released.   A reciprocating saw was used to make the neck cut at the templated preoperative level.  A napkin ring cut of the femoral neck was then made and removed.  A corkscrew was placed into the femoral head and  the remaining head was removed.  The femoral head and acetabulum had severe cartilage loss consistent with their diagnosis.    The operative leg was externally rotated to 90 degrees and a retractor was placed along the lateral aspect of the femoral neck.  The superior capsule was released from the base of the greater trochanter taking care to protect the abductors.  Attention was then turned to the acetabulum.  An anterior acetabular retractor was carefully placed deep to the capsule.  A posterior acetabular retractor was also placed deep to the capsule.  Together these enabled full visualization and exposure of the acetabulum.  Bovie electrocautery was used to resect any residual labrum and pulvinar.  We then began reaming the acetabulum in a sequential fashion to a size 57. This provided a healthy bleeding subchondral bed which would enable a size 58 component to achieve an excellent  press fit. This final acetabular component was impacted into place with appropriate amounts of anteversion and inclination taking care to keep the acetabular component below the anterior wall to avoid soft tissue impingement.  Intraoperative fluoroscopy was used to confirm appropriate acetabular component positioning.  A single cancellous 6.63mm bone screw was placed for additional fixation.  There were no impinging peri-acetabular osteophytes.  The final 36mm + 4 polyethylene liner was impacted into place and found to engage its locking mechanism.     Acetabular retractors were removed carefully, and we turned our attention to preparation of the femur.  The femoral elevator for the Hana table was placed around the femur.  The leg was placed into 90 degrees of external rotation, extension, and adduction.  Residual superior capsule was then released to achieve appropriate femoral visualization.  A femoral elevating retractor was used at the posterolateral aspect of the femur to provide direct access to the intramedullary space.  The  lateral shoulder was carefully cleared of any residual tissue.  A box cutting osteotome was used to open the intramedullary space.  A canal finder was then used to complete opening of the intramedullary space and suction of the canal performed for decompression.  A rasp was used to remove any residual lateral bone to avoid varus malpositioning or under-sizing of the stem. We then began broaching in sequential fashion to a size 8 broach in line with native anteversion.  The final broach was felt to achieve excellent axial and rotational stability and metaphyseal fit.  The leg was returned to a neutral position, and a trial reduction was performed using a 36mm + 1.5 head and high offset neck, and the hip was found to have excellent stability with maximal external rotation, and extension.  Intraoperative fluoroscopy was found to reproduce appropriate leg length and offset.  The distance from the cut surface of femoral calcar to the center of the femoral head was measured.    A bone hook was used to carefully dislocate the hip once again.  Trial implants were then removed. The final size 8 actis high offset femoral stem was impacted into place.  The distance between the cut surface of calcar to center of femoral head were confirmed again and reproduced our trial components.  The trunnion was cleaned and dried and the final 36mm + 1.5 biolox femoral head was impacted into place.  The acetabulum was inspected and found to be clear of any debris.  The final reduction was gently performed.    Hemostasis was obtained with bovie cautery and confirmed to be appropriate. A suture ligature using vicryl sutures were placed around the stumps of the lateral circumflex vessels to prevent  postoperative hematoma. Periarticular injection including 0.5% Marcaine with epinephrine was carefully performed. A dilute betadine lavage 0.35% was used for 3 minutes to bath the incision for additional antibacterial benefit.  Pulsatile lavage was  used to thoroughly irrigate the wound.  All surgical counts were correct.  The capsule was repaired using Ethibond sutures.  No drain was needed due to excellent hemostasis.  The fascia over the tensor and subcutaneous tissue was then closed using 0 Vicryl suture, dermal layer closed using 2-0 Vicryl sutures, and the epidermal layer closed using a running 3-0 stratafix, 2-0 nylon interrupted, and dermabond.  After dermabond was satisfactorily dry, a sterile dressing was applied.    Following the procedure, Mr. Crawl was satisfactorily awakened from sedation and transferred to the postanesthesia care unit in stable condition.  I was present for the entirety of the procedure.    Post-operative Plan: Postoperatively Mr. Rings will be weightbearing as tolerated to the left lower extremity.  DVT prophylaxis measures consisting of aspirin 81mg  BID will be implemented for the duration of the hospital day and for 4 weeks total. Oral analgesic will be used for pain control.  They will receive 24hr of IV antibiotics and 1g of IV tranexamic acid in the pacu as per protocol. Doxycycline BID x 2 weeks due to increased BMI and risk of infection.    Infection Prevention Measurers:  Staph aureus/MRSA Screening  Perioperative antibiotics  Foot/toe covering  CHG clear Pre scrub for skin  Positive Pressure Suiting with sleeves   CHG orange skin prep  4 layers sterile draping including 1 impervious layer  Glove change prior to incision  Ioban layer  Efficient surgery time  Glove change prior to implant insertion  Suction tip change prior to implant insertion  Sterile betadine soak following implant insertion  Dermabond occlusive dressing    Tempie Hoist, MD  Assistant Professor of Orthopaedic Surgery  Division of Adult Reconstruction  [facility]  [address]  [place], [place] [address]    09/09/2021  11:33 AM     Signed:  Zella Ball, MD  on 09/09/2021 at 11:32 AM

## 2021-09-09 NOTE — Progress Notes (Addendum)
Physical Therapy    Initial Eval Completed.  Patient is a 61 y.o.male who presents POD 0 s/p L anterior THA.  Jorge Boyd tolerated mobility well and was pleasant and motivated.  Anticipate D/C to home tomorrow.    Is another physical therapy session necessary prior to hospital discharge:  Yes    Discharge recommendation:  Intermittent supervision/assist, Anticipate return to prior living arrangement, Home PT  Equipment recommendations upon discharge: None  Mobility recommendations for nursing while in hospital: SBA using RW    Patient Active Problem List   Diagnosis Code   . Atrial fibrillation I48.91   . History of kidney stones Z87.442   . History of gout Z87.39   . BPH (benign prostatic hyperplasia) N40.0   . Hypertension I10   . s/p Left THA 09/09/21 Z96.649     Past Medical History:   Diagnosis Date   . Atrial fibrillation    . COVID-19 02/2019   . Diverticulitis    . Gout    . Nephrolithiasis    . Pyelonephritis 12/2018    ESBL E coli   . Sepsis 12/2018    due to pyelonephritis    . Shingles 2016       Past Surgical History:   Procedure Laterality Date   . ANKLE FRACTURE SURGERY Left    . FOREARM FRACTURE SURGERY Left    . HIP REPLACEMENT Right 10/2019    in [place]   . INGUINAL HERNIA REPAIR Right 07/2017   . INGUINAL HERNIA REPAIR Left 2012   . LITHOTRIPSY  2021    x2   . UMBILICAL HERNIA REPAIR  07/2018    incisional hernia repaired       *Bold Indicates co-morbidities affecting treatment and recovery    Comorbidities affecting treatment/recovery in addition to those listed above:  none noted    Personal factors affecting treatment/recovery:   none identified    Clinical presentation:   stable       Patient complexity:  low level as indicated by above personal factors, environmental factors and comorbidities in addition to their impairments found on physical exam.     PT Adult Assessment - 09/09/21 1439        Prior Living     Prior Living Situation Reported by patient;Obtained via chart     Lives With Spouse     Type  of Home Split Level Home     # Steps to Enter Home 2     # Of Steps In Home 5     # Rails in Home 1     Location of Bedrooms 2nd floor     Location of Bathrooms 2nd floor;Basement     Medical Equipment in Home Straight Cane;Rolling walker;Raised toilet seat        Prior Function Level    Prior Function Level Reported by patient     Transfers Independent     Transfer Devices None     Walking Independent;Community distances     Walking assistive devices used None     Stair negotiation Independent     History of Falls No     Receives Help From Independent     Type of Help Received PTA Pt works full time, drives, independent with everything PTA.        PT Tracking    PT TRACKING PT Assigned     Type of Session evaluation     SW Request? No        Treatment  Day    Treatment Day (HH / URR) 1        Precautions/Observations    Precautions used Yes     Total Hip Replacement Surgical Approach: Anterior;No hip precautions per MD order     Weight Bearing Status Left Lower Extremity - Weight bearing as tolerated     LDA Observation IV lines     Was patient wearing a mask? No     PPE worn by Clinical research associate [facility]     Fall Precautions General falls precautions;Patient educated to use call light for nursing assist prior to getting up        Pain Assessment    *Is the patient currently in pain? Yes     Pain (Before,During, After) Therapy During     0-10 Scale 2     Pain Location Leg;Incision     Pain Orientation Left;Anterior;Proximal     Pain Descriptors Sore     Pain Intervention(s) Cold applied;Repositioned;Refer to nursing for pain management        Vision     Current Vision Adequate for PT session        Cognition    Cognition Tested     Arousal/Alertness Appropriate responses to stimuli     Memory Appears intact     Orientation Alert and oriented x4     Ability to Follow Instructions Follows all commands and directions without difficulty     Safety Judgement Intact        UE Assessment    Assessment Focus Strength        LUE  Strength    Overall Strength WFL assessed within functional activities        RUE Strength    Overall Strength WFL assessed within functional activities        LE Assessment    Assessment Focus Strength        LLE Strength    Hip Flexion 2+/5     Knee Extension 3/5     Ankle Dorsiflexion 5/5     Ankle Plantar Flexion 5/5        RLE Strength    Overall Strength WFL assessed within functional activities   5/5       Sensation    Sensation No apparent deficit        Bed Mobility    Bed mobility Tested     Supine to Sit Stand by assistance;1 person assist;Head of bed elevated;Side rails up (#)     Sit to Supine Not tested     Additional comments pt denies dizziness with position change        Transfers    Transfers Tested     Sit to Stand Supervision     Stand to sit Supervision     Transfer Assistive Device rolling walker        Mobility    Mobility Tested     Weight Bearing Status Left Lower Extremity - Weight bearing as tolerated     Gait Pattern 2 point;Antalgic left side;Decreased cadence;Decreased stance time L     Ambulation Assist Stand by     Ambulation Distance (Feet) 300     Ambulation Assistive Device rolling walker        Family/Caregiver Training`    Patient/Family/Caregiver training Yes     Patient training Role of physical therapy in hospital and plan for evaluation and follow up;Discharge planning;PT plan of care after evaluation;Purpose and importance of icing and recommendations for ice frequency and  duration;Benefits of oob activity and mobility during hospitalization, including frequency of ambulation and change of position;Recommendation to increase oob activity and ambulation with nursing while in hospital;Call don't fall, purpose of bed/chair alarm, and recommendation for nursing assistance during all oob mobility        Therapeutic Exercises    Additional comments reviewed ankle pumps, quad sets, glute sets        Balance    Balance Tested     Sitting - Static Independent;Unsupported     Sitting  - Dynamic Standby assist;Supported     Standing - Static Standby assist;Supported     Standing - Dynamic Standby assist;Supported     Additional Comments stood to use urinal and placed gown around back in standing without UE support and without loss of balance        Functional Outcome Measures    Functional Outcome Measures Yes        PT AM-PAC Mobility    Turning over in bed? A little     Moving from lying on back to sitting on the side of the bed? A little     Moving to and from a bed to a chair? A little     Sitting down on and standing up from a chair with arms? A little     Need to walk in hospital room? A little     Climbing 3 - 5 steps with a railing? A little     Total Raw Score 18     Standardized Score - Calculated 43.63     % Functional Impairment - Calculated 47%%        Assessment    Brief Assessment Appropriate for skilled therapy     Problem List Impaired transfers;Impaired ambulation;Impaired bed mobility;Impaired functional status;Pain contributing to impairment     Patient / Family Goal Home tomorrow     Overall Assessment Jorge Boyd tolerated mobility well and was pleasant and motivated.  Anticipate D/C to home tomorrow.        Plan/Recommendation    PT Treatment Interventions Restorative PT     PT Frequency 5-7x/wk;BID Visit     PT Mobility Recommendations SBA using RW     PT Referral Recommendations OT;SW;Home care     PT Discharge Recommendations Intermittent supervision/assist;Anticipate return to prior living arrangement;Home PT     PT Discharge Equipment Recommended None     PT Assessment/Recommendations Reviewed With: Patient;Nursing;Family     Next PT Visit re-assess ambulation and stairs     PT needs to see patient prior to DC  Yes        Time Calculation    PT Timed Codes 0     PT Untimed Codes 25     PT Total Treatment 25        Plan and Onset date    Plan of Care Date 09/09/21     Onset Date 09/09/21     Treatment Start Date 09/09/21               AMPAC Score Interpretation:  Adapted from  Daniel L. Young et al [employer] learning prediction of hospital patient need for post-acute care using admission mobility measure is robust across patient diagnoses. Health Policy and Technology, volume 12, issue 2, June 2023, 100754       Patients 66 years or older with an AM-PAC standardized score of <31 have an increased likelihood of requiring post-acute care (76%)    Patients that are less than 66  years old with an AM-PAC standardized score of <31 (41-59%) or, patients that are>82 years old with an AM-PAC standardized score >/=31 (37-50%) have a moderate likelihood of requiring post-acute care     Patients with an AM-PAC standardized score of >43 have an increased likelihood for discharge to home regardless of age. (5-12%)      Allayne Butcher, PT, DPT  Please web page or secure chat Allayne Butcher for questions

## 2021-09-09 NOTE — Progress Notes (Signed)
Jorge Boyd  ORTHOPAEDIC SURGERY POST-OP CHECK         Subjective:  VSS. Patient recovering as expected. Pain controlled. Tolerating PO. No void yet.  Denies numbness, tingling, nausea, or vomiting.     Objective  Last Filed Vitals    09/09/21 1145   BP:    Pulse:    Resp:    Temp: 36.6 ?C (97.9 ?F)   SpO2: 93%     General: NAD, Pleasant, Appropriate, AOx3   Respiratory: Breathing comfortably on room air, No evidence of respiratory distress  Cardiac: RRR  Abdominal: ND/NT        LLE:   Mepilex clean, dry, intact.   Motor intact hip, knee flexion/extension, ankle dorsiflexion/plantarflexion, toe flexion/extension.   Sensation intact to light touch 1st dorsal web space/medial/lateral/plantar/dorsal foot.   2+ dorsalis pedis/posterior tibialis pulse, toes warm and well-perfused, < 3 second capillary refill.    Assessment/Plan: Jorge Boyd is a 61 y.o. male with history of L hip  OA admitted on 09/09/2021, who is now s/p L THA on 09/09/2021      Recovering as expected, admit for observation  Surgery:  LTHA  Planned return to OR: None  Antibiotic regimen: Post op ancef x 2 doses; 2 weeks doxycycline  Pain Management: Multimodal  DVT prophylaxis: ASA 81mg  BID   Weight bearing status and activity restrictions: WBAT; No Hip Precautions   Diet: Regular; ADAT  Foley: None  Wound Care:  Mepilex until follow up   Xrays/Imaging: PACU AP Pelvis reivewed  Dispo: Pending clinical course, pain control, PT clearance.       Wynn Banker, MD  Orthopaedic Surgery  09/09/2021 11:51 AM

## 2021-09-09 NOTE — INTERIM OP NOTE (Addendum)
Surgery:  LTHA  Planned return to OR: None  Antibiotic regimen: Post op ancef x 2 doses; 2 weeks doxycycline  Pain Management: Multimodal  DVT prophylaxis: ASA 81mg  BID   Weight bearing status and activity restrictions: WBAT; No Hip Precautions   Diet: Regular; ADAT  Foley: None  Wound Care:  Mepilex until follow up   Xrays/Imaging: PACU AP Pelvis  Additional Specifics: See op note    Interim Op Note (Surgical Log ID: [id number])       Date of Surgery: 09/09/2021       Surgeons: Surgeon(s) and Role:     * Zella Ball, MD - Primary     * Wynn Banker, MD - Resident - Assisting   Assistants:         Pre-op Diagnosis: Pre-Op Diagnosis Codes:     * Primary osteoarthritis of left hip [M16.12]       Post-op Diagnosis: Post-Op Diagnosis Codes:     * Primary osteoarthritis of left hip [M16.12]       Procedure(s) Performed: Procedure(s) (LRB):  LEFT ARTHROPLASTY, HIP, TOTAL, ANTERIOR APPROACH (Left)       Anesthesia Type: General        Fluid Totals: I/O this shift:  08/09 0700 - 08/09 1459  In: 1500 (11.2 mL/kg) [I.V.:1500]  Out: 300 (2.2 mL/kg) [Blood:300]  Net: 1200  Weight: 134.3 kg        Estimated Blood Loss: 300 mL       Specimens to Pathology:  * No specimens in log *       Temporary Implants:        Packing:                 Patient Condition: good       Findings (Including unexpected complications): See op  note     Signed:  Wynn Banker, MD  on 09/09/2021 at 11:49 AM

## 2021-09-09 NOTE — Interval H&P Note (Signed)
UPDATES TO PATIENT'S CONDITION on the DAY OF SURGERY/PROCEDURE    I. Updates to Patient's Condition (to be completed by a provider privileged to complete a H&P, following reassessment of the patient by the provider):    Day of Surgery/Procedure Update:  History  History reviewed and no change    Physical  Physical exam updated and no change            II. Procedure Readiness   I have reviewed the patient's H&P and updated condition. By completing and signing this form, I attest that this patient is ready for surgery/procedure.    III. Attestation   I have reviewed the updated information regarding the patient's condition and it is appropriate to proceed with the planned surgery/procedure.      Zella Ball, MD as of 9:32 AM 09/09/2021

## 2021-09-09 NOTE — Progress Notes (Signed)
Report Given To  SE7 RN      Descriptive Sentence / Reason for Admission   Date: 09/09/21 Room / Location: [id number]_OR_[id number] / [facility] MAIN OR   Anesthesia Start: [phone] Anesthesia Stop:    Procedure: LEFT ARTHROPLASTY, HIP, TOTAL, ANTERIOR APPROACH (Left: Hip) Diagnosis:        Primary osteoarthritis of left hip       (Primary osteoarthritis of left hip [M16.12])   Surgeons: Zella Ball, MD Responsible Anesthesia: Lauro Regulus, MD   Anesthesia Type: general ASA Status: 3           Active Issues / Relevant Events   Patient received Bupivacaine spinal.   Has sensation at level L4  Patient NOT on BG protocol  Last pain score 0/10  Oxygen is   Medications administered 125 NS  Xray complete in PACU   Bladder scanned for: 80ml    ANESTHESIA HISTORY  Pertinent(-):  No History of anesthetic complications or Family hx of anesthetic complications     GENERAL  Pertinent (-):  No infection, history of anesthetic complications or Family Hx of Anesthetic Complications     HEENT  Pertinent (-):  No glaucoma, visual impairment, hearing loss, TMJD, sinus issues, nosebleeds or neck pain PULMONARY  Pertinent(-):  No smoking, asthma, shortness of breath, snoring, sleep apnea or COPD     CARDIOVASCULAR  Good(4+METs) Exercise Tolerance    + Hypertension    + Anticoagulants          ASA    + Hx of Dysrhythmias (while septic from UTI)          atrial fib  Pertinent(-):  No past MI, CAD, valvular heart disease, orthopnea or hx of DVT    Comment: Can climb 1 FOS without dyspnea or CP, goes hiking, plays competitive softball, no dyspnea or CP     GI/HEPATIC/RENAL   NPO: > 8hrs ago (solids)      + Renal Issues          hx of kidney stones  Pertinent(-):  No GERD, nausea, vomiting, liver  issues, pancreatic issues, bowel issues or  esophageal issues (denies dysphagia )  NEURO/PSYCH/ORTHO  Pertinent(-):  No dizziness/motion sickness, syncope, seizures, cerebrovascular event, peripheral nerve issue or gait/mobility issues     ENDO/OTHER  Pertinent(-):   No diabetes mellitus, thyroid disease, pituitary disease, adrenal disease, hormone use, steroid use, chemo Hx     HEMATOLOGIC          ASA    + Arthritis          hips  Pertinent(-):  No bruising/bleeding easily, coagulopathy, blood transfusion, blood dyscrasia or autoimmune disease            Physical Exam     Airway            Mouth opening: normal            Mallampati: II            TM distance (fb): >3 FB            Neck ROM: full  Dental   Normal Exam   Comment: Denies loose or broken teeth    Cardiovascular           Rhythm: regular           Rate: normal  No murmur           General Survey    Normal Exam  No rashes, wounds  Pulmonary   Normal Exam    breath sounds clear to auscultation    No wheezes     Mental Status   Normal Exam    oriented to person, place and time      Patient Education from CPM/PAT Provider:            To Do List  PT, OT, Post op orders        Anticipatory Guidance / Discharge Planning  Pending

## 2021-09-09 NOTE — Progress Notes (Signed)
ATSP for lateral hip swelling. Soft mobile swelling of lateral thigh adjacent to mepilex. Likely hematoma, would expect some ecchymosis in the near future. Reassurance given, Continue current plan.     Genene Churn, PA

## 2021-09-10 ENCOUNTER — Encounter: Payer: Self-pay | Admitting: Orthopedic Surgery

## 2021-09-10 ENCOUNTER — Non-Acute Institutional Stay: Payer: BLUE CROSS/BLUE SHIELD | Attending: Family Medicine

## 2021-09-10 ENCOUNTER — Other Ambulatory Visit: Payer: Self-pay

## 2021-09-10 LAB — POTASSIUM: Potassium: 3.8 mmol/L (ref 3.3–5.1)

## 2021-09-10 MED ORDER — FAMOTIDINE 20 MG PO TABS *I*
20.0000 mg | ORAL_TABLET | Freq: Two times a day (BID) | ORAL | 0 refills | Status: DC
Start: 2021-09-10 — End: 2021-09-29
  Filled 2021-09-10: qty 56, 28d supply, fill #0

## 2021-09-10 MED ORDER — ASPIRIN 81 MG PO TBEC *I*
81.0000 mg | DELAYED_RELEASE_TABLET | Freq: Every day | ORAL | Status: DC
Start: 2021-09-10 — End: 2022-05-26

## 2021-09-10 MED ORDER — SENNOSIDES 8.6 MG PO TABS *I*
2.0000 | ORAL_TABLET | Freq: Every evening | ORAL | 0 refills | Status: DC
Start: 2021-09-10 — End: 2021-09-29
  Filled 2021-09-10: qty 100, 50d supply, fill #0

## 2021-09-10 MED ORDER — ASPIRIN 81 MG PO TBEC *I*
81.0000 mg | DELAYED_RELEASE_TABLET | Freq: Two times a day (BID) | ORAL | 0 refills | Status: AC
Start: 2021-09-10 — End: 2021-10-08
  Filled 2021-09-10: qty 56, 28d supply, fill #0

## 2021-09-10 MED ORDER — OXYCODONE HCL 5 MG PO TABS *I*
5.0000 mg | ORAL_TABLET | ORAL | 0 refills | Status: DC | PRN
Start: 2021-09-10 — End: 2021-09-29
  Filled 2021-09-10: qty 42, 4d supply, fill #0

## 2021-09-10 MED ORDER — ACETAMINOPHEN 500 MG PO TABS *I*
1000.0000 mg | ORAL_TABLET | Freq: Three times a day (TID) | ORAL | 0 refills | Status: DC | PRN
Start: 2021-09-10 — End: 2022-04-13
  Filled 2021-09-10: qty 100, 16d supply, fill #0

## 2021-09-10 MED ORDER — DOCUSATE SODIUM 100 MG PO CAPS *I*
200.0000 mg | ORAL_CAPSULE | Freq: Every day | ORAL | 0 refills | Status: DC
Start: 2021-09-11 — End: 2021-09-29
  Filled 2021-09-10: qty 100, 50d supply, fill #0

## 2021-09-10 MED ORDER — DOXYCYCLINE HYCLATE 100 MG PO TABS *I*
100.0000 mg | ORAL_TABLET | Freq: Two times a day (BID) | ORAL | 0 refills | Status: AC
Start: 2021-09-10 — End: 2021-09-24
  Filled 2021-09-10: qty 28, 14d supply, fill #0

## 2021-09-10 MED ORDER — MELOXICAM 15 MG PO TABS *I*
15.0000 mg | ORAL_TABLET | Freq: Every day | ORAL | 0 refills | Status: AC
Start: 2021-09-11 — End: 2021-09-25
  Filled 2021-09-10: qty 14, 14d supply, fill #0

## 2021-09-10 MED ORDER — AZELAIC ACID 15 % EX GEL *A*
CUTANEOUS | 2 refills | Status: DC
Start: 2021-09-10 — End: 2021-09-29

## 2021-09-10 MED ORDER — NONFORMULARY (OTHER) ORDER *I*
0 refills | Status: DC
Start: 2021-09-10 — End: 2021-09-29

## 2021-09-10 NOTE — Progress Notes (Signed)
Occupational Therapy    Initial Eval Completed.  Patient is a 61 y.o.male who presents s/p L THA POD #1. Pt demonstrates safe and adequate independence with ADL's to return home with intermittent supervision/assist from family as needed.    Does OT need to see patient PRIOR to D/C: No    Discharge recommendation:   Prior Living Environment, Intermittent supervision/assist   Equipment recommendations:  None   Hospital Stay Recommendations for nursing: SBA with RW    Past Medical History:   Diagnosis Date   . Atrial fibrillation    . COVID-19 02/2019   . Diverticulitis    . Gout    . Nephrolithiasis    . Pyelonephritis 12/2018    ESBL E coli   . Sepsis 12/2018    due to pyelonephritis    . Shingles 2016       Past Surgical History:   Procedure Laterality Date   . ANKLE FRACTURE SURGERY Left    . FOREARM FRACTURE SURGERY Left    . HIP REPLACEMENT Right 10/2019    in [place]   . INGUINAL HERNIA REPAIR Right 07/2017   . INGUINAL HERNIA REPAIR Left 2012   . LITHOTRIPSY  2021    x2   . UMBILICAL HERNIA REPAIR  07/2018    incisional hernia repaired       *Bold Indicates co-morbidities affecting treatment and recovery    Occupational Profile relating to the present problem:   Caregiver limitations   Needs assistance for mobility    In addition to the PMH and surgical history listed above, the comorbidities affecting treatment/ recovery:   Osteoarthritis (OA)    Performance deficits that result in activity limitations and/or performance restrictions:  Decreased ADLs/IADLs, transfers, endurance, and balance     Modification of tasks needed or assistance with assessments necessary to enable completion of evaluation: mod I mobility with RW, min assist LB dressing (assist for TEDs stockings), mod I toileting     Patient complexity:  low level as indicated by above personal factors, environmental factors and comorbidities in addition to their impairments found on physical exam.     OT Assessment (HH / URR) - 09/10/21 3295        OT  Tracking  (HH / URR)    OT Tracking (HH / URR) OT Discontinue     Type of Session evaluation     SW Request? No        OT Last Visit    Visit (#)  0        Precautions    Precautions used Yes     Total Hip Replacement NO HIP PRECAUTIONS     Weight Bearing Status Left Lower Extremity - Weight bearing as tolerated     Fall Precautions General falls precautions;Visitor present   spouse at bedside    Isolation Precautions Contact   ESBL    LDA Observation None     Other Hematoma noted on L thigh, ortho aware.     Patient Wearing Mask No     Writer wearing PPE including Gloves;Mask;Gown     Activity Order Activity as tolerated        Home Living (Prior to Admission)    Prior Living Situation Reported by patient;Obtained via chart review     Type of Home Split Level     Location of Bedroom Second floor     Location of Bathroom Second floor     # Steps to Enter Home 2     #  Of Steps In Home 5     Bathroom Shower/Tub Tub/Shower unit     Bathroom Equipment Raised toilet seat with arms   non skid mat in shower, pt states they are going to put in Environmental consultant in [employer] Walker;Cane;Raised toiler seat;Adaptive Equipment   reacher    Bathroom Accessibility Accessible        Prior Function    Prior Function Reported by patient;Obtained during chart review     Level of Independence Independent with ADLs;Independent with ADL functional transfers;Independent ambulation;Independent with homemaking with ambulation;Driving;Working     Lives With [employer] Help From Independent     IADL Independent     Vocational Full time employment     Additional Comments Pt independent for ADLs/IADLs at baseline, very active. Pt's spouse able to assist as needed at discharge.        Pain Assessment    *Is the patient currently in pain? Yes     Pain Location 1 Hip     Orientation of Pain 1 Left     Pain Scale 1 3     Pain 1 Before;During;After        Vision     Current Vision No deficit noted     Additional Comments  Able to read sign on wall, clock        Cognition    Cognition No deficit noted     Level of Alertness Appropriate responses to stimuli     Orientation Alert and oriented x4     Attention  Appears intact     Memory Appears intact     Following Commands Follows one step commands 100% of the time     Safety/Judgement Intact     Additional Comments Pleasant and willing to participate. Demonstrates appropriate problem solving and safety awareness        Perception    Perception No deficit noted        Coordination    Coordination --   GMC/FMC WFL for ADLs observed       Sensation    Sensation No apparent deficit        UE Assessment    UE Assessment Full AROM RUE;Full AROM LUE     Additional Comments Strength WFL for ADLs observed        Bed Mobility    Supine to Sit Modified Independent;HOB elevated     Sit to Supine Modified Independent;HOB elevated        Functional Transfers    Sit to Stand Modified Independent;Rolling Walker     Stand to Sit Modified Independent;Rolling Walker     Toilet Transfers Modified Independent;Rolling Walker   on/off standard toilet with grab bar use, would benefit from RTS at home which pt reports he has    Functional Mobility Modified Independent;Rolling Walker   to/from bathroom    Additional Comments Pt demonstrates appropriate technique and safety with RW. No LOB noted.        Balance    Sitting - Static Independent      Sitting - Dynamic Independent     Standing - Static Independent     Standing - Dynamic Independent     Standing Tolerance during Functional Task Good     Additional Comments No LOB noted        ADL Assessment    Grooming Independent     UE Dressing Independent     LE  Dressing Minimal Assist     Where  LE Dressing Assessed Edge of bed;Standing     Assist Needed With: Use of adaptive equipment;Increased time;Socks   assist for B TEDs stockings    Equipment Used Walker;Reacher     Toileting Modified independence     Where Toileting Assessed Other (comment)   uses urinal  while standing in bathroom    Assist needed with Use of bedpan/urinal setup     Additional Comments Demonstrates independence with ADLs observed, provided with reacher to don underwear/pants, pt reports they have one at home to utilize. Pt requires assist to thread B TEDs stockings over feet, reports spouse can assist as needed. Pt and pt's spouse educated on TEDs wearing schedule, verbalized understanding. Pt with no further ADL concerns for d/c home.        IADL Assessment    IADL Needs assistance     Additional Comments Pt's spouse able to assist as needed at discharge        Activity Tolerance    Endurance Tolerates 30 min activity with multiple rests        Functional Outcomes Measures    Functional Outcome Measures Yes        OT AM-PAC Self Care    Putting on and taking off regular lower body clothing? 3     Bathing (including washing, rinsing, drying)? 3     Toileting, which includes using toilet, bedpan, or urinal? 4     Putting on and taking off regular upper body clothing? 4     Taking care of personal grooming such as brushing teeth? 4     Eating Meals 4     Total Raw Score 22     CMS Score - Calculated 25.80%        Assessment    Assessment Impaired ADL status;Impaired balance;Impaired endurance;Impaired self-care transfers;Impaired instrumental ADL's        Plan    OT Frequency One-time visit     No acute OT needs Pt demonstrates adequate ADL skills for return to prior living environment;No acute OT goals identified        Recommendation    OT Discharge Recommendations Prior Living Environment;Intermittent supervision     OT Discharge Equipment Recommended --   None    OT Additional Comments Pt cleared OT     OT Hospital Stay Recommendations SBA with RW     OT Next Visit n/a     OT needs to see patient prior to DC  No        Multidisciplinary Communication    Multidisciplinary Communication RN, PT, pt        Patient/Family/Caregiver Training    Patient/Family/Caregiver training Yes      Patient/Family/Caregiver training Role of occupational therapy in hospital and plan for evaluation;Discharge planning;OT plan of care after evaluation;Post-operative precautions;Call don't fall, purpose of bed/chair alarm, and recommendations for nursing;Compression stocking education and wearing schedule;Benefit of OOB activity and participation in ADLs while inpatient;Equipment recommendations for discharge;Use of Adaptive equipment;Purpose of and importance of icing and recommendations for frequency        Time Calculation    OT Timed Codes 0     OT Untimed Codes 20     OT Unbilled Time 0     OT Total Treatment 20        Plan and Onset date    Plan of Care Date 09/10/21     Onset Date 09/09/21     Treatment  Start Date 09/10/21                     AMPAC Score Interpretation:  Adapted from Roxanne Gates, et al Association of AM-PAC "6 Clicks" Basic Mobility and Daily Activity Scores with discharge destination, 2021  AM-PAC Raw Score of > 19 (standardized score > 40.22 %) indicates a high likelihood of discharge to home     Adapted from [employer] al "Validity of h AM-PAC "6- Clicks" Inpatient Daily Activity and Basic Mobility Short Forms (2014)   AM-PAC raw score  ? 18 (standardized score ? 39, % impairment  < 47%) indicates a high probability of a discharge to home  AM-PAC raw score  ? 13 (standardized score ? 32.03, % impairment 63%) indicates pt is more likely to require IRF/SNF R  AM-PAC raw score <12 (standardized score < 30.6 % impairment > 66%) indicates pt is more likely to require Long Term Care    Alric Ran OTR/L  Secure chat or web page: Alric Ran

## 2021-09-10 NOTE — Discharge Summary (Signed)
Name: Katie Schowalter MRN: [id number] DOB: 1960/08/01     Admit Date: 09/09/2021   Date of Discharge: 09/10/2021     Patient was accepted for discharge to   [facility] [[facility]]       Discharge Attending Physician: Zella Ball, MD      Hospitalization Summary    CONCISE NARRATIVE: Dixie Dials was admitted for elective arthroplasty. Their post operative course was without significant events. Patient was discharged to home          OR PROCEDURE: Left Total Hip Arthroplasty 09/09/2021                SIGNIFICANT MED CHANGES: Yes  Chemical DVT prophylaxis was initiated. Detailed in [facility]. Analgesics tailored to tolerance and adequate pain management.   Doxy 100 mg twice daily x 2 weeks for infection prophylaxis.          Signed: Tonny Branch Galena Logie, PA  On: 09/10/2021  at: 8:50 AM

## 2021-09-10 NOTE — Anesthesia Postprocedure Evaluation (Signed)
Anesthesia Post-Op Note    Patient: Jorge Boyd    Procedure(s) Performed:  Procedure Summary  Date:  09/09/2021 Anesthesia Start: 09/09/2021  9:46 AM Anesthesia Stop: 09/09/2021 11:47 AM Room / Location:  [id number]_OR_09 / HH MAIN OR   Procedure(s):  LEFT ARTHROPLASTY, HIP, TOTAL, ANTERIOR APPROACH Diagnosis:  Primary osteoarthritis of left hip [M16.12] Surgeon(s):  Zella Ball, MD  Wynn Banker, MD Responsible Anesthesia Provider:  Lauro Regulus, MD         Recovery Vitals  BP: 119/71 (09/10/2021  7:51 AM)  Heart Rate: 81 (09/10/2021  7:51 AM)  Heart Rate (via Pulse Ox): 70 (09/09/2021 12:30 PM)  Resp: 18 (09/10/2021  7:51 AM)  Temp: 37.8 ?C (100 ?F) (09/10/2021  7:51 AM)  SpO2: 93 % (09/10/2021  7:51 AM)  O2 Flow Rate: 2 L/min (09/09/2021 12:00 PM)   0-10 Scale: 0 (09/10/2021  9:10 AM)    Anesthesia type:  general  Complications Noted During Procedure or in PACU:  None   Comment:    Patient Location:  PACU  Level of Consciousness:    Recovered to baseline  Patient Participation:     Able to participate  Temperature Status:    Normothermic  Oxygen Saturation:    Within patient's normal range  Cardiac Status:   Within patient's normal range  Fluid Status:    Stable  Airway Patency:     Yes  Pulmonary Status:    Baseline  Neuraxial Block Evaluation:    Block resolving  Pain Management:    Adequate analgesia  Nausea and Vomiting:    Controlled    Post Op Assessment:    Tolerated procedure well  Responsible Anesthesia Provider Attestation:  All indicated post anesthesia care provided       -

## 2021-09-10 NOTE — Progress Notes (Signed)
[facility]  ORTHOPAEDIC SURGERY POST-OP CHECK         Subjective:  VSS. Patient recovering as expected. Soft mobile swelling of lateral thigh adjacent to mepilex noted overnight, likley hematoma, patient reassured. Otherwise, pain controlled. Tolerating PO. Spontaneous void. Cleared PT.  Denies numbness, tingling, nausea, or vomiting.     Objective  Last Filed Vitals    09/09/21 1931   BP: 121/74   Pulse: 92   Resp: 16   Temp: 36.7 C (98.1 F)   SpO2: 93%     General: NAD, Pleasant, Appropriate, AOx3   Respiratory: Breathing comfortably on room air, No evidence of respiratory distress  Cardiac: RRR  Abdominal: ND/NT        LLE:   Mepilex clean, dry, intact. Soft mobile mass, with mild ecchymosis, likely hematoma.  Motor intact hip, knee flexion/extension, ankle dorsiflexion/plantarflexion, toe flexion/extension.   Sensation intact to light touch 1st dorsal web space/medial/lateral/plantar/dorsal foot.   2+ dorsalis pedis/posterior tibialis pulse, toes warm and well-perfused, < 3 second capillary refill.    Assessment/Plan: Jorge Boyd is a 61 y.o. male with history of L hip  OA admitted on 09/09/2021, who is now s/p L THA on 09/10/2021      Recovering as expected, admit for observation  Surgery:  LTHA  Planned return to OR: None  Antibiotic regimen: Post op ancef x 2 doses; 2 weeks doxycycline  Pain Management: Multimodal  DVT prophylaxis: ASA 81mg  BID   Weight bearing status and activity restrictions: WBAT; No Hip Precautions   Diet: Regular; ADAT  Foley: None  Wound Care:  Mepilex until follow up   Xrays/Imaging: PACU AP Pelvis reivewed  Dispo: Anticipate home today      Wynn Banker, MD  Orthopaedic Surgery  09/10/2021 12:05 AM

## 2021-09-11 ENCOUNTER — Encounter: Payer: BLUE CROSS/BLUE SHIELD | Admitting: Family Medicine

## 2021-09-11 ENCOUNTER — Non-Acute Institutional Stay: Payer: BLUE CROSS/BLUE SHIELD

## 2021-09-11 ENCOUNTER — Telehealth: Payer: Self-pay

## 2021-09-11 ENCOUNTER — Other Ambulatory Visit: Payer: Self-pay

## 2021-09-11 DIAGNOSIS — Z471 Aftercare following joint replacement surgery: Secondary | ICD-10-CM

## 2021-09-11 NOTE — Telephone Encounter (Signed)
The following are the patient's responses to the POD # 2 CARESENSE QUESTIONS    Have you reviewed your discharge instructions?  Yes     Are you tolerating food and drinks?  Yes     Have you had a bowel movement since your surgery?  No - If you have not had a bowel movement by your 3rd day after surgery please consider adding Miralax,(capful to a glass of water or juice)  to help facilitate a bowel movement. Again, make sure you are drinking plenty of fluids (at least 64oz of water per day) especially in this warmer weather.  Utilize your pharmacist, or please call your Primary Care Physician?s office, if you have any concerns about the addition of these medications to your current home regimen    Is your pain being tolerated well with pain medication?  Yes     Are you wearing your white stockings/TEDs/compression stockings?  Yes     Are you able to manage your surgical dressing on your own?  Yes     Is there drainage from your incision that's covering more than 50 percent of your dressing?  No     Are you taking your blood thinners? Examples: aspirin, Lovenox, Coumadin, Xarelto, and Eliquis.  Yes     Are you walking every hour while awake?  Yes     Instructed patient to call the Orthopedic Nurse Navigators with any questions or concerns

## 2021-09-11 NOTE — Initial Assessments (Signed)
Assessment findings/ Changes from previous visit: Pt s/p left THA by Dr. Darrol Angel on 09/09/21. Pt lives with his supportive wife [name] in a spilt level home. Pt reporting independence at baseline without need for AD. Pt reporting being familiar with recovery process due to having had right THA in [place] last year. Reports playing in an adult softball league and is hopeful to improve his strength and be able to get back to playing.  Skilled Need/Focus of Care: Cinician reviewed discharge instructions from hospitalization with pt and CG including instruction on medications (purpose/dose/frequency/side effects), s/s infection and when to notify MD, wearing of TED stocking x 2 weeks post-op on during the day and off at night, incision care, and pain/edema management. Performed ROM/MMT assessment and instructed on HEP for strength training. Performed home safety assessment and functional mobility assessment  Caregiver availability and willingness: Pt's wife, [name], present and involved throughout the visit. She is assisting pt with medication management, transportation to appointments, meals, laundry, and TED stocking donning/doffing.  Discharge plan: Pt to be discharged from home PT once all goals are met. Pt instructed on plan for 1-2x/wk for up to 3 weeks of home PT. Pt reporting agreement with plan.  Plan for next visit: instruct on gait training with straight cane as appropriate, update HEP as pt tolerates, f/u pain management, wound assess

## 2021-09-13 ENCOUNTER — Other Ambulatory Visit: Payer: Self-pay

## 2021-09-14 ENCOUNTER — Other Ambulatory Visit: Payer: Self-pay

## 2021-09-14 ENCOUNTER — Other Ambulatory Visit: Payer: BLUE CROSS/BLUE SHIELD

## 2021-09-15 ENCOUNTER — Other Ambulatory Visit: Payer: Self-pay

## 2021-09-16 NOTE — Progress Notes (Signed)
Letter received from [employer] [place] that pt in home PT has been approved. Val S LPN

## 2021-09-17 ENCOUNTER — Other Ambulatory Visit: Payer: Self-pay

## 2021-09-17 ENCOUNTER — Other Ambulatory Visit: Payer: BLUE CROSS/BLUE SHIELD | Admitting: Rehabilitative and Restorative Service Providers"

## 2021-09-17 NOTE — Progress Notes (Signed)
Assessment findings/ Changes from previous visit: pt seated outside when therapist arrives; has been able to transition from gait with walker to gait with cane, tolerating well, and able to do short distances in the home without cane; pain is well-managed noting 1/10 on NRS on average, most discomfort at areas of bruising (left posterolateral hip and left anteromedial thigh superior to knee joint). reviewed cryotherapy and BLE elevation for pain edema management; pt with good teachback on this instruction  Skilled Need/Focus of Care: reinforced d/c instructions - medications, s/sx of infection and when to report concerns to MD, ther-ex/HEP progression, use of assistive device, pain/edema management, wearing of TED hose x 2 weeks postop, incision care (s/p L THA). reviewed home safety and falls prevention.    Caregiver availability and willingness: pt's wife, [name], present for end of visit. she is supportive and assisting pt with medication management, transportation to appointments, meals, laundry, and TED stocking donning/doffing.   Discharge plan: anticipate possible d/c by end of next week; pt agreeable to 1-2 visits per week for up to 3 weeks, d/c when goals met or max therapeutic benefit reached or when ready to transition to outpatient PT services.  Plan for next visit: cont with gait/stair training with SPC; update/progress HEP as tolerated; assess wound, f/u pain/edema mgmt. if ready for d/c, review all d/c instructions.

## 2021-09-18 ENCOUNTER — Ambulatory Visit: Payer: BLUE CROSS/BLUE SHIELD | Admitting: Rehabilitative and Restorative Service Providers"

## 2021-09-18 ENCOUNTER — Other Ambulatory Visit: Payer: Self-pay

## 2021-09-21 ENCOUNTER — Other Ambulatory Visit: Payer: Self-pay

## 2021-09-21 ENCOUNTER — Ambulatory Visit: Payer: BLUE CROSS/BLUE SHIELD | Admitting: Rehabilitative and Restorative Service Providers"

## 2021-09-22 ENCOUNTER — Other Ambulatory Visit: Payer: Self-pay

## 2021-09-22 ENCOUNTER — Ambulatory Visit: Payer: BLUE CROSS/BLUE SHIELD | Admitting: Rehabilitative and Restorative Service Providers"

## 2021-09-24 ENCOUNTER — Other Ambulatory Visit: Payer: BLUE CROSS/BLUE SHIELD | Admitting: Rehabilitative and Restorative Service Providers"

## 2021-09-24 ENCOUNTER — Ambulatory Visit: Payer: BLUE CROSS/BLUE SHIELD | Admitting: Rehabilitative and Restorative Service Providers"

## 2021-09-24 ENCOUNTER — Other Ambulatory Visit: Payer: Self-pay

## 2021-09-24 NOTE — Discharge Summary (Signed)
Pt declined visit today (over phone); would like to be discharged from home care PT without a visit. States he has been walking 10,000 steps a day, doing all HEP, an no other questions/concerns at 2 weeks postop. He will follow-up with MD next week and likely transition to outpatient PT. Not able to complete all interventions today due to not seeing patient in-person, but at last visit pt had good teachback on all meds, postop instructions for pain/edema mgmt, and knows when to contact MD with concerns.

## 2021-09-25 ENCOUNTER — Ambulatory Visit: Payer: BLUE CROSS/BLUE SHIELD | Admitting: Rehabilitative and Restorative Service Providers"

## 2021-09-28 ENCOUNTER — Ambulatory Visit: Payer: BLUE CROSS/BLUE SHIELD | Admitting: Rehabilitative and Restorative Service Providers"

## 2021-09-28 ENCOUNTER — Other Ambulatory Visit: Payer: Self-pay

## 2021-09-29 ENCOUNTER — Ambulatory Visit: Payer: BLUE CROSS/BLUE SHIELD | Admitting: Family Medicine

## 2021-09-29 ENCOUNTER — Other Ambulatory Visit: Payer: Self-pay

## 2021-09-29 ENCOUNTER — Encounter: Payer: Self-pay | Admitting: Family Medicine

## 2021-09-29 VITALS — BP 118/76 | HR 83 | Temp 98.6°F | Ht 75.5 in | Wt 294.0 lb

## 2021-09-29 DIAGNOSIS — Z8739 Personal history of other diseases of the musculoskeletal system and connective tissue: Secondary | ICD-10-CM

## 2021-09-29 DIAGNOSIS — I10 Essential (primary) hypertension: Secondary | ICD-10-CM

## 2021-09-29 DIAGNOSIS — Z0000 Encounter for general adult medical examination without abnormal findings: Secondary | ICD-10-CM

## 2021-09-29 DIAGNOSIS — M25512 Pain in left shoulder: Secondary | ICD-10-CM | POA: Insufficient documentation

## 2021-09-29 DIAGNOSIS — N400 Enlarged prostate without lower urinary tract symptoms: Secondary | ICD-10-CM

## 2021-09-29 DIAGNOSIS — I480 Paroxysmal atrial fibrillation: Secondary | ICD-10-CM

## 2021-09-29 MED ORDER — TAMSULOSIN HCL 0.4 MG PO CAPS *I*
0.8000 mg | ORAL_CAPSULE | Freq: Every day | ORAL | 1 refills | Status: DC
Start: 2021-09-29 — End: 2021-11-03

## 2021-09-29 NOTE — Progress Notes (Signed)
Chief Complaint:   Chief Complaint   Patient presents with   . Annual Exam       Patient ID: Jorge Boyd is a 61 y.o. man with a history of atrial fibrillation, kidney stones, pyelonephritis, and osteoarthritis, here for physical.     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 Completed home PT. Discharged from PT.   Occasionally takes motrin.   Taking aspirin 81 mg BID for 1 month postop.       Left shoulder - still having pain after dislocation this past spring. Has weakness, limited ROM.   S/p cortisone injection in March 2023 - it didn't really help.   Worked with trainer - didn't see improvement.       Paroxysmal atrial fibrillation  Continues diltiazem 120 mg daily and aspirin 81 mg - currently twice daily.   July 2023 EKG with first degree AV block (seen on previously on EKG).   Denies chest pain, palpitations, cough, shortness of breath, leg swelling, headache, dizziness.      Primary hypertension  Continues HCTZ 25 mg daily and diltiazem 120 mg daily.      History of gout  No recent gout flare.  April 2023 uric acid controlled at 6.1.  Continues allopurinol 300 mg daily.       Benign prostatic hyperplasia  Daytime urinary frequency, but drinks a lot of water.  Nocturia several times per night.  Also has some urinary dribbling requiring pad.  Plans for surgical intervention at some point, after joint issues remedied, likely in [place].  Continues tamsulosin 0.8 mg nightly and tadalafil 5 mg daily.        Patient's medications, allergies, past medical, surgical, social and family histories were personally reviewed and updated in eRecord today.    Patient Active Problem List    Diagnosis Date Noted   . Left shoulder pain 09/29/2021   . 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 Hypertension 06/26/2021   . History of gout 05/19/2021   . BPH (benign prostatic hyperplasia) 05/19/2021   . Atrial fibrillation 07/10/2020   . History of kidney stones 07/10/2020     Past Medical History:    Diagnosis Date   . Atrial fibrillation    . COVID-19 02/2019   . Diverticulitis    . Gout    . Nephrolithiasis    . Pyelonephritis 12/2018    ESBL E coli   . Sepsis 12/2018    due to pyelonephritis    . Shingles 2016     Past Surgical History:   Procedure Laterality Date   . ANKLE FRACTURE SURGERY Left    . FOREARM FRACTURE SURGERY Left    . HIP REPLACEMENT Right 10/2019    in [place]   . INGUINAL HERNIA REPAIR Right 07/2017   . INGUINAL HERNIA REPAIR Left 2012   . LITHOTRIPSY  2021    x2   . PR ARTHRP ACETBLR/PROX FEM PROSTC AGRFT/ALGRFT Left 09/09/2021    Procedure: LEFT ARTHROPLASTY, HIP, TOTAL, ANTERIOR APPROACH;  Surgeon: Zella Ball, MD;  Location: [facility] OR;  Service: Orthopedics   . UMBILICAL HERNIA REPAIR  07/2018    incisional hernia repaired     Current Outpatient Medications   Medication Sig   . acetaminophen (TYLENOL) 500 mg tablet Take 2 tablets (1,000 mg total) by mouth 3 times daily as needed  for Pain Following an Operation   . aspirin 81 mg EC tablet Take  1 tablet (81 mg total) by mouth 2 times daily for 28 days  for Prophylaxis of Deep Vein Thrombosis in Orthopedic Surgery   . Cholecalciferol (VITAMIN D) 125 MCG (5000 UT) CAPS Take 1 capsule by mouth daily  for Vitamin D Deficiency   . biotin 5 MG tablet Take 1 tablet (5 mg total) by mouth daily  for Vitamin Deficiency   . ascorbic acid (VITAMIN C) 1,000 mg tablet Take 1 tablet (1,000 mg total) by mouth daily  for promote bone healing   . Cranberry 4200 MG CAPS Take 1 capsule by mouth daily  for urinary issues   . Zinc 50 MG TABS Take 1 tablet by mouth daily  for Zinc Deficiency   . co-enzyme Q-10 100 MG oral solid Take 1 each (100 mg total) by mouth daily  for Heart Rhythm Disorder   . KRILL OIL PO Take 800 mg by mouth daily  for supplememt   . hydroCHLOROthiazide (HYDRODIURIL) 25 mg tablet TAKE 1 TABLET BY MOUTH EVERY DAY IN THE MORNING   . tadalafil (CIALIS) 5 MG tablet Take 1 tablet (5 mg total) by mouth daily   . allopurinol (ZYLOPRIM)  300 mg tablet Take 1 tablet (300 mg total) by mouth daily   . potassium citrate (UROCIT-K) 10 mEq (1080 mg) CR tablet Take 1 tablet (10 mEq total) by mouth daily  for hx of kidney stones   . dilTIAZem (TIAZAC, DILTZAC, TAZTIA XT) 120 mg 24 hr capsule diltiazem CD 120 mg capsule,extended release 24 hr   . Multiple Vitamin (MULTIVITAMIN PO) Take 1 tablet by mouth daily  for vitamin supplement   . tamsulosin (FLOMAX) 0.4 mg capsule Take 2 capsules (0.8 mg total) by mouth daily  for Benign Enlargement of Prostate   . aspirin 81 mg EC tablet Take 1 tablet (81 mg total) by mouth daily  Resume after DVT prophylaxis completed (Patient not taking: Reported on 09/29/2021)     No current facility-administered medications for this visit.     Allergies   Allergen Reactions   . Demerol Hcl [Meperidine] Nausea And Vomiting     Family History   Adopted: Yes   Problem Relation Age of Onset   . Anesthesia problems Neg Hx      Social History     Socioeconomic History   . Marital status: Married   Tobacco Use   . Smoking status: Never   . Smokeless tobacco: Never   Substance and Sexual Activity   . Alcohol use: Not Currently   . Drug use: Never   . Sexual activity: Yes     Partners: Female     Comment: monogamous   Social History Narrative    Splits time between [place] and [place].    Lives with wife, Sofie Rower.     3 kids and 2 granddaughters.    Business Conservation officer, historic buildings.       Review of Systems   Constitutional: Negative for chills, fever, malaise/fatigue and weight loss.   HENT: Positive for hearing loss (did have baseline test). Negative for congestion, ear pain and sore throat.    Eyes: Negative for blurred vision, double vision, pain, discharge and redness.   Respiratory: Negative for cough, shortness of breath and wheezing.    Cardiovascular: Negative for chest pain, palpitations, orthopnea and leg swelling.   Gastrointestinal: Negative for abdominal pain, blood in stool, constipation,  diarrhea, heartburn, nausea and vomiting.   Genitourinary: Negative for dysuria, frequency,  hematuria and urgency.   Musculoskeletal: Positive for joint pain (left shoulder). Negative for myalgias.   Skin: Negative for itching and rash.   Neurological: Negative for dizziness, tingling, sensory change, focal weakness and headaches.   Endo/Heme/Allergies: Negative for polydipsia. Does not bruise/bleed easily.   Psychiatric/Behavioral: Negative for depression. The patient is not nervous/anxious.        Objective:  BP 118/76 (BP Location: Left arm, Cuff Size: large adult)   Pulse 83   Temp 37 C (98.6 F) (Temporal)   Ht 1.918 m (6' 3.5")   Wt 133.4 kg (294 lb)   SpO2 98%   BMI 36.26 kg/m   Physical Exam  Constitutional:       Appearance: Normal appearance.   HENT:      Head: Normocephalic.      Right Ear: Tympanic membrane, ear canal and external ear normal.      Left Ear: Tympanic membrane, ear canal and external ear normal.      Mouth/Throat:      Mouth: Mucous membranes are moist.      Pharynx: Oropharynx is clear. No oropharyngeal exudate.   Eyes:      General: No scleral icterus.        Right eye: No discharge.         Left eye: No discharge.      Conjunctiva/sclera: Conjunctivae normal.      Pupils: Pupils are equal, round, and reactive to light.   Neck:      Thyroid: No thyromegaly.   Cardiovascular:      Rate and Rhythm: Normal rate and regular rhythm.      Heart sounds: Normal heart sounds. No murmur heard.  Pulmonary:      Effort: Pulmonary effort is normal. No respiratory distress.      Breath sounds: Normal breath sounds.   Abdominal:      General: Bowel sounds are normal. There is no distension.      Palpations: Abdomen is soft. There is no mass.      Tenderness: There is no abdominal tenderness. There is no guarding or rebound.   Musculoskeletal:      Cervical back: Neck supple.      Right lower leg: No edema.      Left lower leg: No edema.      Comments: Left shoulder: normal ROM abduction and  internal/external rotation, +empty can test (weakness, no pain).   Lymphadenopathy:      Cervical: No cervical adenopathy.   Skin:     General: Skin is warm and dry.      Findings: No rash.   Neurological:      Mental Status: He is alert and oriented to person, place, and time.      Gait: Gait is intact.   Psychiatric:         Mood and Affect: Mood and affect normal.         Behavior: Behavior normal.       Assessment/Plan:    1. Health care maintenance  -Counseled on: diet, exercise, seatbelts, smoke detector, sunscreen, routine dental and eye care, smoking, alcohol    Immunizations  -Covid - Due - declines  -Flu - plan for booster this fall   -Tetanus - UTD  -Zoster - Due - [name] will consider   -MMR - check titer with next labs     Cancer screening  -Colon  - scheduled 10/6 with Dr Gwenlyn Fudge.   -Prostate - April 2023 - PSA  2.82   -Lung - not indicated     STI, HIV - declines. Previous HIV screen neg.   Hep C - no concerns. will screen   Dentist - UTD  Eye doctor - due     Cholesterol - April 2023 - good  The 10-year ASCVD risk score (Arnett DK, et al., 2019) is: 9%    Values used to calculate the score:      Age: 84 years      Sex: Male      Is Non-Hispanic African American: No      Diabetic: No      Tobacco smoker: No      Systolic Blood Pressure: 118 mmHg      Is BP treated: Yes      HDL Cholesterol: 31 mg/dL      Total Cholesterol: 154 mg/dL    DM - July 2023 Z6X 5.7   Diet - less processed foods. Working on trying to get up HDL. Limiting red meat, more fish.   Exercise - yes     Safety - discussed   HCP - on file, reviewed    Labs in 6 months:   - Measles IgG antibody; Future  - Hepatitis C antibody; Future  - Hemoglobin A1c; Future  - CBC; Future  - Comprehensive metabolic panel; Future  - Lipid Panel (Reflex to Direct  LDL if Triglycerides more than 400); Future    2. Left shoulder pain  Left shoulder pain improved, and has good ROM, but continues to have significant weakness. Hasn't improved after working  with Event organiser.   Discussed referral to Dr Hervey Ard.   - [facility] REFERRAL TO [facility]    3. Paroxysmal atrial fibrillation  Asymptomatic. No recent known afib. Cardiologist in [facility].   Continues diltiazem 120 mg daily and aspirin 81 mg - currently twice daily (post-op DVT prophylaxis).     4. Primary hypertension  BP well controlled at goal of <130/80. Aug 2023 BMP stable.   Continues HCTZ 25 mg daily and diltiazem 120 mg daily.  BP and BMP recheck in 6 months.     5. Benign prostatic hyperplasia  Continued nocturia, daytime frequency, and dribbling.   Plans to see urology in [facility]   Continues tamsulosin 0.8 mg nightly and tadalafil 5 mg daily.    6. History of gout  No recent gout flare.  April 2023 uric acid controlled at 6.1.  Continues allopurinol 300 mg daily.  Recheck uric acid level in 6 months.         Patient Instructions   Schedule appointment with eye doctor.   Fasting blood work before next appointment.         Follow up: 6 months/as needed

## 2021-09-29 NOTE — Patient Instructions (Addendum)
Schedule appointment with eye doctor.   Fasting blood work before next appointment.

## 2021-10-01 ENCOUNTER — Encounter: Payer: Self-pay | Admitting: Orthopedic Surgery

## 2021-10-01 ENCOUNTER — Other Ambulatory Visit: Payer: Self-pay

## 2021-10-01 ENCOUNTER — Ambulatory Visit: Payer: BLUE CROSS/BLUE SHIELD | Admitting: Orthopedic Surgery

## 2021-10-01 ENCOUNTER — Ambulatory Visit
Admission: RE | Admit: 2021-10-01 | Discharge: 2021-10-01 | Disposition: A | Discharge status: Home / Self Care | Payer: BLUE CROSS/BLUE SHIELD | Source: Ambulatory Visit | Attending: Adult Health | Admitting: Adult Health

## 2021-10-01 VITALS — BP 128/90 | Ht 75.0 in | Wt 280.0 lb

## 2021-10-01 DIAGNOSIS — M1612 Unilateral primary osteoarthritis, left hip: Secondary | ICD-10-CM | POA: Insufficient documentation

## 2021-10-01 DIAGNOSIS — Z471 Aftercare following joint replacement surgery: Secondary | ICD-10-CM

## 2021-10-01 DIAGNOSIS — Z96642 Presence of left artificial hip joint: Secondary | ICD-10-CM

## 2021-10-01 NOTE — Progress Notes (Addendum)
History of Present Illness:  Jorge Boyd is a 61 y.o. male who underwent L THA approximately 2 weeks ago. Today, Jorge Boyd is doing very well. There is residual pain as may be expected 2 weeks post-operatively localized to the anterior aspect of the left hip, very mild. The patient has been taking Tylenol if needed for pain.  There have been no problems with the incisional site, no fevers or chills, and no lower extremity numbness or tingling.  Physical therapy has been initiated and is progressing well, they are participating in home PT, he has been discharged.  He walks over 10,000 steps per day. They are walking without assistive devices.    Current Medications:  Current Outpatient Medications on File Prior to Visit   Medication Sig Dispense Refill   ? tamsulosin (FLOMAX) 0.4 mg capsule Take 2 capsules (0.8 mg total) by mouth daily  for Benign Enlargement of Prostate 180 capsule 1   ? polyethylene glycol (GLYCOLAX,MIRALAX) 17 g powder packet Take by mouth daily     ? acetaminophen (TYLENOL) 500 mg tablet Take 2 tablets (1,000 mg total) by mouth 3 times daily as needed  for Pain Following an Operation 100 tablet 0   ? aspirin 81 mg EC tablet Take 1 tablet (81 mg total) by mouth 2 times daily for 28 days  for Prophylaxis of Deep Vein Thrombosis in Orthopedic Surgery 56 tablet 0   ? Cholecalciferol (VITAMIN D) 125 MCG (5000 UT) CAPS Take 1 capsule by mouth daily  for Vitamin D Deficiency     ? biotin 5 MG tablet Take 1 tablet (5 mg total) by mouth daily  for Vitamin Deficiency     ? ascorbic acid (VITAMIN C) 1,000 mg tablet Take 1 tablet (1,000 mg total) by mouth daily  for promote bone healing     ? Cranberry 4200 MG CAPS Take 1 capsule by mouth daily  for urinary issues     ? Zinc 50 MG TABS Take 1 tablet by mouth daily  for Zinc Deficiency     ? co-enzyme Q-10 100 MG oral solid Take 1 each (100 mg total) by mouth daily  for Heart Rhythm Disorder     ? KRILL OIL PO Take 800 mg by mouth daily  for  supplememt     ? hydroCHLOROthiazide (HYDRODIURIL) 25 mg tablet TAKE 1 TABLET BY MOUTH EVERY DAY IN THE MORNING 90 tablet 1   ? tadalafil (CIALIS) 5 MG tablet Take 1 tablet (5 mg total) by mouth daily 90 tablet 1   ? allopurinol (ZYLOPRIM) 300 mg tablet Take 1 tablet (300 mg total) by mouth daily 90 tablet 1   ? potassium citrate (UROCIT-K) 10 mEq (1080 mg) CR tablet Take 1 tablet (10 mEq total) by mouth daily  for hx of kidney stones     ? dilTIAZem (TIAZAC, DILTZAC, TAZTIA XT) 120 mg 24 hr capsule diltiazem CD 120 mg capsule,extended release 24 hr     ? Multiple Vitamin (MULTIVITAMIN PO) Take 1 tablet by mouth daily  for vitamin supplement     ? aspirin 81 mg EC tablet Take 1 tablet (81 mg total) by mouth daily  Resume after DVT prophylaxis completed (Patient not taking: Reported on 09/29/2021)       No current facility-administered medications on file prior to visit.       Allergies:  Allergies   Allergen Reactions   ? Demerol Hcl [Meperidine] Nausea And Vomiting     There is no history  of allergies to metals.    Physical Examination:  Body mass index is 35 kg/m?.  BP 128/90   Ht 1.905 m (6\' 3" )   Wt 127 kg (280 lb)   BMI 35.00 kg/m?     Jorge Boyd is seen ambulating with good gait.    Left Lower Extremity:  Incision is clean and dry, healing well.    There is appropriate amount of swelling for their postoperative course.   There is no erythema, warmth, or drainage.    Distal extremity is warm and perfused.  Sensation is intact to light touch in the deep peroneal, superficial peroneal, and tibial nerve distributions and this is symmetric.   Good strength distally.   There is pain-free passive ROM of the hip with gentle flexion, extension, internal rotation, external rotation, adduction, and abduction. Leg lengths are approximately equal.     RADIOLOGY:  AP pelvis and cross table lateral from today show s/p left cementless total hip replacement, no evidence of fracture or loosening.  Stable interval  radiographs since postoperative films, no evidence of subsidence.  Appropriate alignment of the left hip replacement components is seen.    ASSESSMENT:  Jorge Boyd is a 61 y.o. male s/p L THA, approximately 2 weeks ago, doing very well.    PLAN:  Complete 4-week course for DVT prophylaxis.  Complete formal physical therapy course for gait training, ROM, and strengthening.  Once home PT ends, they can continue exercises on their own, no formal outpatient PT for now.  Fall prevention/precautions are reinforced.  May continue prescribed oral analgesics for pain control, tylenol.  Ice to surgical site for swelling.  Return for f/u visit in 4 weeks with x-rays to include AP pelvis, and AP, lateral, and cross-table lateral views of the operative hip.    Answers for HPI/ROS submitted by the patient on 09/24/2021  What is your goal for today's visit?: Get Clear to begin activities and a revised timeline  Date of onset: : 02/25/2016  Was this the result of an injury?: Yes  What is your pain level?: 1/10  Please describe the quality of your pain: : aching, discomfort  Progression since onset: : rapidly improving  Is this a work related condition? : No  Current work status: : usual activities  Fever: No  Chills: No  Numbness: No  Tingling: No

## 2021-10-01 NOTE — Patient Instructions (Signed)
Continue aspirin for two more weeks.    Complete formal home physical therapy for gait training, range of motion, and strengthening.  Once home PT ends, you can continue exercises on your own, no formal outpatient PT for now.    Be careful not to fall. Use a walker or cane until you feel strong and safe without an assistive device.    Can stop wearing compression stockings    Can drive if you feel safe to do so.    Can continue prescribed medications for pain control with plan to wean narcotic pain medications as tolerated    Ice to surgical site for swelling or pain.    Advised to not schedule dental cleanings or invasive procedures for 3 months. We discussed that antibiotics are no longer standard recommendation prior to dental cleanings after total joint replacement, with some exceptions.    Return for follow up visit in 4 weeks    Please call our office at [phone] for questions, concerns, or appointments.     Thank you.

## 2021-10-02 ENCOUNTER — Ambulatory Visit: Payer: BLUE CROSS/BLUE SHIELD | Admitting: Rehabilitative and Restorative Service Providers"

## 2021-10-07 ENCOUNTER — Other Ambulatory Visit: Payer: Self-pay

## 2021-10-15 ENCOUNTER — Encounter: Payer: Self-pay | Admitting: Family Medicine

## 2021-10-15 ENCOUNTER — Other Ambulatory Visit: Payer: Self-pay | Admitting: Family Medicine

## 2021-10-15 DIAGNOSIS — Z8739 Personal history of other diseases of the musculoskeletal system and connective tissue: Secondary | ICD-10-CM

## 2021-10-15 NOTE — Telephone Encounter (Signed)
Last office visit:   09/29/2021  Last telemedicine visit:  01/08/2021  Last PA office visit:  Visit date not found  Patients upcoming appointments:  Future Appointments   Date Time Provider Department Center   10/29/2021 10:00 AM Zella Ball, MD [facility] None   04/01/2022 11:00 AM [name], MD [facility] None     Recent Lab results:  GENERAL CHEMISTRY   Recent Labs     09/10/21  0505 09/09/21  1626 08/31/21  0930 05/19/21  0953   NA  --  138 141 139   K 3.8 CANCELED 4.0 4.5   CL  --  103 102 101   CO2  --  23 25 28    GAP  --  12 14 10    UN  --  19 23* 21*   CREAT  --  0.92 1.06 1.14   GLU  --  150* 109* 108*   CA  --  9.0 10.0 9.8   URIC  --   --   --  6.1      LIPID PROFILE   Recent Labs     05/19/21  0953   CHOL 154   TRIG 112   HDL 31*   LDLC 101      LIVER PROFILE   Recent Labs     08/31/21  0930 05/19/21  0953 01/06/21  0822   ALT 28 28 27    AST 26 25 20    ALK 120 100 112   TB 0.8 0.8 0.7      DIABETES THYROID   Recent Labs     08/31/21  0930   HA1C 5.7*    Recent Labs     05/19/21  0953   TSH 2.81         Pending/Orders Labs:  Lab Frequency Next Occurrence   COVID-19 PCR Once 11/24/2020   * Hip LEFT AP and Lateral with pelvis Once 09/11/2021   * Hip LEFT AP and Lateral with pelvis Once 09/11/2021   Measles IgG antibody Once 09/29/2021   Hepatitis C antibody Once 09/29/2021   Hemoglobin A1c Once 02/01/2022   CBC Once 02/01/2022   Comprehensive metabolic panel Once 02/01/2022   Lipid Panel (Reflex to Direct  LDL if Triglycerides more than 400) Once 02/01/2022   PSA (eff.05-2008) Once 02/01/2022   Uric acid Once 02/01/2022

## 2021-10-15 NOTE — Telephone Encounter (Signed)
Last office visit:   09/29/2021  Last telemedicine visit:  01/08/2021  Last PA office visit:  Visit date not found  Patients upcoming appointments:  Future Appointments   Date Time Provider Department Center   10/29/2021 10:00 AM Ricciardi, Benjamin, MD [facility] None   04/01/2022 11:00 AM Schwartz, Carla R, MD [facility] None     Recent Lab results:  GENERAL CHEMISTRY   Recent Labs     09/10/21  0505 09/09/21  1626 08/31/21  0930 05/19/21  0953   NA  --  138 141 139   K 3.8 CANCELED 4.0 4.5   CL  --  103 102 101   CO2  --  23 25 28   GAP  --  12 14 10   UN  --  19 23* 21*   CREAT  --  0.92 1.06 1.14   GLU  --  150* 109* 108*   CA  --  9.0 10.0 9.8   URIC  --   --   --  6.1      LIPID PROFILE   Recent Labs     05/19/21  0953   CHOL 154   TRIG 112   HDL 31*   LDLC 101      LIVER PROFILE   Recent Labs     08/31/21  0930 05/19/21  0953 01/06/21  0822   ALT 28 28 27   AST 26 25 20   ALK 120 100 112   TB 0.8 0.8 0.7      DIABETES THYROID   Recent Labs     08/31/21  0930   HA1C 5.7*    Recent Labs     05/19/21  0953   TSH 2.81         Pending/Orders Labs:  Lab Frequency Next Occurrence   COVID-19 PCR Once 11/24/2020   * Hip LEFT AP and Lateral with pelvis Once 09/11/2021   * Hip LEFT AP and Lateral with pelvis Once 09/11/2021   Measles IgG antibody Once 09/29/2021   Hepatitis C antibody Once 09/29/2021   Hemoglobin A1c Once 02/01/2022   CBC Once 02/01/2022   Comprehensive metabolic panel Once 02/01/2022   Lipid Panel (Reflex to Direct  LDL if Triglycerides more than 400) Once 02/01/2022   PSA (eff.05-2008) Once 02/01/2022   Uric acid Once 02/01/2022

## 2021-10-16 MED ORDER — DILTIAZEM HCL ER BEADS 120 MG PO CP24 *A*
120.0000 mg | ORAL_CAPSULE | Freq: Every day | ORAL | 1 refills | Status: DC
Start: 2021-10-16 — End: 2022-01-11

## 2021-10-16 MED ORDER — POTASSIUM CITRATE 1080 MG PO TBCR *I*
10.0000 meq | ORAL_TABLET | Freq: Every day | ORAL | 1 refills | Status: DC
Start: 2021-10-16 — End: 2022-01-11

## 2021-10-17 ENCOUNTER — Encounter: Payer: Self-pay | Admitting: Orthopedic Surgery

## 2021-10-29 ENCOUNTER — Ambulatory Visit
Admission: RE | Admit: 2021-10-29 | Discharge: 2021-10-29 | Disposition: A | Discharge status: Home / Self Care | Payer: BLUE CROSS/BLUE SHIELD | Source: Ambulatory Visit | Attending: Adult Health | Admitting: Adult Health

## 2021-10-29 ENCOUNTER — Ambulatory Visit: Payer: BLUE CROSS/BLUE SHIELD | Admitting: Orthopedic Surgery

## 2021-10-29 ENCOUNTER — Other Ambulatory Visit: Payer: Self-pay

## 2021-10-29 ENCOUNTER — Encounter: Payer: Self-pay | Admitting: Orthopedic Surgery

## 2021-10-29 DIAGNOSIS — Z96649 Presence of unspecified artificial hip joint: Secondary | ICD-10-CM

## 2021-10-29 DIAGNOSIS — Z96642 Presence of left artificial hip joint: Secondary | ICD-10-CM

## 2021-10-29 DIAGNOSIS — M1612 Unilateral primary osteoarthritis, left hip: Secondary | ICD-10-CM | POA: Insufficient documentation

## 2021-10-29 DIAGNOSIS — Z471 Aftercare following joint replacement surgery: Secondary | ICD-10-CM

## 2021-10-29 NOTE — Progress Notes (Signed)
History of Present Illness:  Jorge Boyd is a 61 y.o. male who underwent L THA, approximately 8 weeks ago. Today, Jorge Boyd is doing very well. The patient has no pain and has been taking nothing for pain.  There have been no problems with the incisional site, no fevers or chills, and no lower extremity numbness or tingling. He walks without assistive device. He has been discharged from home PT and are comfortable doing exercises on their own. He is lifting weights and walking a great deal, especially when he was in [place] last week.  Altogether Jorge Boyd is pleased with the outcome of surgery.     Current Medications:  Current Outpatient Medications on File Prior to Visit   Medication Sig Dispense Refill    allopurinol (ZYLOPRIM) 300 mg tablet TAKE 1 TABLET BY MOUTH EVERY DAY 90 tablet 1    potassium citrate (UROCIT-K) 10 mEq (1080 mg) CR tablet Take 1 tablet (10 mEq total) by mouth daily  for hx of kidney stones 90 tablet 1    dilTIAZem (TIAZAC, DILTZAC, TAZTIA XT) 120 mg 24 hr capsule Take 1 capsule (120 mg total) by mouth daily  for High Blood Pressure Disorder 90 capsule 1    tamsulosin (FLOMAX) 0.4 mg capsule Take 2 capsules (0.8 mg total) by mouth daily  for Benign Enlargement of Prostate 180 capsule 1    polyethylene glycol (GLYCOLAX,MIRALAX) 17 g powder packet Take by mouth daily      acetaminophen (TYLENOL) 500 mg tablet Take 2 tablets (1,000 mg total) by mouth 3 times daily as needed  for Pain Following an Operation 100 tablet 0    aspirin 81 mg EC tablet Take 1 tablet (81 mg total) by mouth daily  Resume after DVT prophylaxis completed      Cholecalciferol (VITAMIN D) 125 MCG (5000 UT) CAPS Take 1 capsule by mouth daily  for Vitamin D Deficiency      biotin 5 MG tablet Take 1 tablet (5 mg total) by mouth daily  for Vitamin Deficiency      ascorbic acid (VITAMIN C) 1,000 mg tablet Take 1 tablet (1,000 mg total) by mouth daily  for promote bone healing      Cranberry 4200 MG CAPS Take 1  capsule by mouth daily  for urinary issues      Zinc 50 MG TABS Take 1 tablet by mouth daily  for Zinc Deficiency      co-enzyme Q-10 100 MG oral solid Take 1 each (100 mg total) by mouth daily  for Heart Rhythm Disorder      KRILL OIL PO Take 800 mg by mouth daily  for supplememt      hydroCHLOROthiazide (HYDRODIURIL) 25 mg tablet TAKE 1 TABLET BY MOUTH EVERY DAY IN THE MORNING 90 tablet 1    tadalafil (CIALIS) 5 MG tablet Take 1 tablet (5 mg total) by mouth daily 90 tablet 1    Multiple Vitamin (MULTIVITAMIN PO) Take 1 tablet by mouth daily  for vitamin supplement       No current facility-administered medications on file prior to visit.       Allergies:  Allergies   Allergen Reactions    Demerol Hcl [Meperidine] Nausea And Vomiting     There is no history of allergies to metals.    Physical Examination:  There is no height or weight on file to calculate BMI.  There were no vitals taken for this visit.    Jorge Boyd is seen ambulating with  good gait.    Left Lower Extremity:  Incision is clean and dry, healed well.  There is minimal swelling. There is no erythema, warmth, or drainage.   Distal extremity is warm and perfused.  Sensation is intact to light touch in the deep peroneal, superficial peroneal, and tibial nerve distributions and this is symmetric.   Good strength distally, there is 5/5 strength of the iliopsoas and hip adbuctors.   There is pain-free passive ROM of the hip with flexion, extension, internal rotation, external rotation, adduction, and abduction.     Radiographic Examination/ Imaging:  AP pelvis and cross table lateral from today show s/p cementless left total hip replacement with no evidence of fracture or loosening or hardware failure.  Appropriate alignment of the left hip replacement components is seen.  No interval changes since prior radiographs are visualized.    ASSESSMENT:  Jorge Boyd is a 61 y.o. male s/p L THA approximately 6 weeks ago, doing very well.    PLAN:  May  discontinue anti-coagulation for DVT prophylaxis.  Continue WBAT.  Continue physical therapy for gait training, ROM, and strengthening.  No need for outpatient PT, they can continue walking and gentle exercises on their own with a focus on abductor and quad strengthening.  Ok to start elliptical and recumbent bike with low resistance, would wait 1 month to start weight lifting more.  Fall prevention/precautions are reinforced.  He is going to [place] for the winter, will be back in January, he can see me when he returns.

## 2021-11-03 ENCOUNTER — Other Ambulatory Visit: Payer: Self-pay

## 2021-11-03 DIAGNOSIS — N400 Enlarged prostate without lower urinary tract symptoms: Secondary | ICD-10-CM

## 2021-11-03 NOTE — Telephone Encounter (Signed)
Last office visit:   09/29/2021  Last telemedicine visit:  01/08/2021  Last PA office visit:  Visit date not found  Patients upcoming appointments:  Future Appointments   Date Time Provider Department Center   04/01/2022 11:00 AM [name], MD [facility] None     Recent Lab results:  GENERAL CHEMISTRY   Recent Labs     09/10/21  0505 09/09/21  1626 08/31/21  0930 05/19/21  0953   NA  --  138 141 139   K 3.8 CANCELED 4.0 4.5   CL  --  103 102 101   CO2  --  23 25 28    GAP  --  12 14 10    UN  --  19 23* 21*   CREAT  --  0.92 1.06 1.14   GLU  --  150* 109* 108*   CA  --  9.0 10.0 9.8   URIC  --   --   --  6.1      LIPID PROFILE   Recent Labs     05/19/21  0953   CHOL 154   TRIG 112   HDL 31*   LDLC 101      LIVER PROFILE   Recent Labs     08/31/21  0930 05/19/21  0953 01/06/21  0822   ALT 28 28 27    AST 26 25 20    ALK 120 100 112   TB 0.8 0.8 0.7      DIABETES THYROID   Recent Labs     08/31/21  0930   HA1C 5.7*    Recent Labs     05/19/21  0953   TSH 2.81         Pending/Orders Labs:  Lab Frequency Next Occurrence   COVID-19 PCR Once 11/24/2020   * Hip LEFT AP and Lateral with pelvis Once 09/11/2021   Measles IgG antibody Once 09/29/2021   Hepatitis C antibody Once 09/29/2021   Hemoglobin A1c Once 02/01/2022   CBC Once 02/01/2022   Comprehensive metabolic panel Once 02/01/2022   Lipid Panel (Reflex to Direct  LDL if Triglycerides more than 400) Once 02/01/2022   PSA (eff.05-2008) Once 02/01/2022   Uric acid Once 02/01/2022

## 2021-11-06 ENCOUNTER — Other Ambulatory Visit
Admission: RE | Admit: 2021-11-06 | Discharge: 2021-11-06 | Disposition: A | Discharge status: Home / Self Care | Payer: BLUE CROSS/BLUE SHIELD | Source: Ambulatory Visit | Attending: Pathology | Admitting: Pathology

## 2021-11-06 ENCOUNTER — Encounter: Payer: Self-pay | Admitting: Gastroenterology

## 2021-11-06 DIAGNOSIS — Z1211 Encounter for screening for malignant neoplasm of colon: Secondary | ICD-10-CM | POA: Insufficient documentation

## 2021-11-06 HISTORY — PX: COLONOSCOPY: SHX174

## 2021-11-06 LAB — HM COLONOSCOPY

## 2021-11-12 ENCOUNTER — Encounter: Payer: Self-pay | Admitting: Family Medicine

## 2021-11-12 NOTE — Telephone Encounter (Signed)
Forms received and placed in Dr Arlyss Gandy folder. Toconnor lpn

## 2021-11-14 ENCOUNTER — Encounter: Payer: Self-pay | Admitting: Family Medicine

## 2021-11-14 LAB — SURGICAL PATHOLOGY

## 2021-11-16 ENCOUNTER — Encounter: Payer: Self-pay | Admitting: Gastroenterology

## 2021-11-20 ENCOUNTER — Encounter: Payer: Self-pay | Admitting: Family Medicine

## 2021-11-20 NOTE — Telephone Encounter (Signed)
UA with 1+ leukocytes and 1+ protein.

## 2021-11-20 NOTE — Telephone Encounter (Signed)
This patient attachment is not clinically relevant.  Please delete from their chart.    Thank you,  Eastin Swing R Leonell Lobdell, MD

## 2021-11-27 ENCOUNTER — Encounter: Payer: Self-pay | Admitting: Family Medicine

## 2021-12-28 ENCOUNTER — Other Ambulatory Visit: Payer: Self-pay

## 2021-12-28 DIAGNOSIS — N400 Enlarged prostate without lower urinary tract symptoms: Secondary | ICD-10-CM

## 2021-12-28 MED ORDER — TAMSULOSIN HCL 0.4 MG PO CAPS *I*
0.4000 mg | ORAL_CAPSULE | Freq: Every day | ORAL | 1 refills | Status: DC
Start: 2021-12-28 — End: 2022-08-17

## 2022-01-07 ENCOUNTER — Other Ambulatory Visit: Payer: Self-pay | Admitting: Family Medicine

## 2022-01-07 NOTE — Telephone Encounter (Signed)
Last office visit:   09/29/2021  Last telemedicine visit:  Visit date not found  Last PA office visit:  Visit date not found  Patients upcoming appointments:  Future Appointments   Date Time Provider Department Center   04/01/2022 11:00 AM [name], MD [facility] None     Recent Lab results:  GENERAL CHEMISTRY   Recent Labs     09/10/21  0505 09/09/21  1626 08/31/21  0930 05/19/21  0953   NA  --  138 141 139   K 3.8 CANCELED 4.0 4.5   CL  --  103 102 101   CO2  --  23 25 28    GAP  --  12 14 10    UN  --  19 23* 21*   CREAT  --  0.92 1.06 1.14   GLU  --  150* 109* 108*   CA  --  9.0 10.0 9.8   URIC  --   --   --  6.1      LIPID PROFILE   Recent Labs     05/19/21  0953   CHOL 154   TRIG 112   HDL 31*   LDLC 101      LIVER PROFILE   Recent Labs     08/31/21  0930 05/19/21  0953   ALT 28 28   AST 26 25   ALK 120 100   TB 0.8 0.8      DIABETES THYROID   Recent Labs     08/31/21  0930   HA1C 5.7*    Recent Labs     05/19/21  0953   TSH 2.81         Pending/Orders Labs:  Lab Frequency Next Occurrence   * Hip LEFT AP and Lateral with pelvis Once 09/11/2021   Measles IgG antibody Once 09/29/2021   Hepatitis C antibody Once 09/29/2021   Hemoglobin A1c Once 02/01/2022   CBC Once 02/01/2022   Comprehensive metabolic panel Once 02/01/2022   Lipid Panel (Reflex to Direct  LDL if Triglycerides more than 400) Once 02/01/2022   PSA (eff.05-2008) Once 02/01/2022   Uric acid Once 02/01/2022

## 2022-01-11 ENCOUNTER — Other Ambulatory Visit: Payer: Self-pay | Admitting: Family Medicine

## 2022-01-11 NOTE — Telephone Encounter (Signed)
Last office visit:   09/29/2021  Last telemedicine visit:  Visit date not found  Last PA office visit:  Visit date not found  Patients upcoming appointments:  Future Appointments   Date Time Provider Department Center   04/01/2022 11:00 AM Schwartz, Carla R, MD [facility] None     Recent Lab results:  GENERAL CHEMISTRY   Recent Labs     09/10/21  0505 09/09/21  1626 08/31/21  0930 05/19/21  0953   NA  --  138 141 139   K 3.8 CANCELED 4.0 4.5   CL  --  103 102 101   CO2  --  23 25 28   GAP  --  12 14 10   UN  --  19 23* 21*   CREAT  --  0.92 1.06 1.14   GLU  --  150* 109* 108*   CA  --  9.0 10.0 9.8   URIC  --   --   --  6.1      LIPID PROFILE   Recent Labs     05/19/21  0953   CHOL 154   TRIG 112   HDL 31*   LDLC 101      LIVER PROFILE   Recent Labs     08/31/21  0930 05/19/21  0953   ALT 28 28   AST 26 25   ALK 120 100   TB 0.8 0.8      DIABETES THYROID   Recent Labs     08/31/21  0930   HA1C 5.7*    Recent Labs     05/19/21  0953   TSH 2.81         Pending/Orders Labs:  Lab Frequency Next Occurrence   * Hip LEFT AP and Lateral with pelvis Once 09/11/2021   Measles IgG antibody Once 09/29/2021   Hepatitis C antibody Once 09/29/2021   Hemoglobin A1c Once 02/01/2022   CBC Once 02/01/2022   Comprehensive metabolic panel Once 02/01/2022   Lipid Panel (Reflex to Direct  LDL if Triglycerides more than 400) Once 02/01/2022   PSA (eff.05-2008) Once 02/01/2022   Uric acid Once 02/01/2022

## 2022-02-15 ENCOUNTER — Other Ambulatory Visit: Payer: Self-pay | Admitting: Family Medicine

## 2022-02-15 NOTE — Telephone Encounter (Signed)
Last office visit:   09/29/2021  Last telemedicine visit:  Visit date not found  Last PA office visit:  Visit date not found  Patients upcoming appointments:  Future Appointments   Date Time Provider Department Center   04/01/2022 10:30 AM Greig Right, MD [facility] None     Recent Lab results:  GENERAL CHEMISTRY   Recent Labs     09/10/21  0505 09/09/21  1626 08/31/21  0930 05/19/21  0953   NA  --  138 141 139   K 3.8 CANCELED 4.0 4.5   CL  --  103 102 101   CO2  --  23 25 28    GAP  --  12 14 10    UN  --  19 23* 21*   CREAT  --  0.92 1.06 1.14   GLU  --  150* 109* 108*   CA  --  9.0 10.0 9.8   URIC  --   --   --  6.1      LIPID PROFILE   Recent Labs     05/19/21  0953   CHOL 154   TRIG 112   HDL 31*   LDLC 101      LIVER PROFILE   Recent Labs     08/31/21  0930 05/19/21  0953   ALT 28 28   AST 26 25   ALK 120 100   TB 0.8 0.8      DIABETES THYROID   Recent Labs     08/31/21  0930   HA1C 5.7*    Recent Labs     05/19/21  0953   TSH 2.81         Pending/Orders Labs:  Lab Frequency Next Occurrence   * Hip LEFT AP and Lateral with pelvis Once 09/11/2021   Measles IgG antibody Once 09/29/2021   Hepatitis C antibody Once 09/29/2021   Hemoglobin A1c Once 02/01/2022   CBC Once 02/01/2022   Comprehensive metabolic panel Once 02/01/2022   Lipid Panel (Reflex to Direct  LDL if Triglycerides more than 400) Once 02/01/2022   PSA (eff.05-2008) Once 02/01/2022   Uric acid Once 02/01/2022

## 2022-02-17 ENCOUNTER — Encounter: Payer: Self-pay | Admitting: Family Medicine

## 2022-02-17 ENCOUNTER — Ambulatory Visit: Payer: BLUE CROSS/BLUE SHIELD | Admitting: Obstetrics and Gynecology

## 2022-02-17 ENCOUNTER — Ambulatory Visit: Payer: BLUE CROSS/BLUE SHIELD | Attending: Family Medicine | Admitting: Family Medicine

## 2022-02-17 ENCOUNTER — Other Ambulatory Visit: Payer: Self-pay

## 2022-02-17 ENCOUNTER — Other Ambulatory Visit
Admission: RE | Admit: 2022-02-17 | Discharge: 2022-02-17 | Disposition: A | Discharge status: Home / Self Care | Payer: BLUE CROSS/BLUE SHIELD | Source: Ambulatory Visit | Attending: Family Medicine | Admitting: Family Medicine

## 2022-02-17 VITALS — BP 110/72 | HR 90 | Temp 98.2°F | Ht 75.0 in | Wt 300.0 lb

## 2022-02-17 DIAGNOSIS — N390 Urinary tract infection, site not specified: Secondary | ICD-10-CM

## 2022-02-17 DIAGNOSIS — R059 Cough, unspecified: Secondary | ICD-10-CM | POA: Insufficient documentation

## 2022-02-17 DIAGNOSIS — Z87448 Personal history of other diseases of urinary system: Secondary | ICD-10-CM

## 2022-02-17 DIAGNOSIS — R509 Fever, unspecified: Secondary | ICD-10-CM | POA: Insufficient documentation

## 2022-02-17 DIAGNOSIS — M79606 Pain in leg, unspecified: Secondary | ICD-10-CM

## 2022-02-17 DIAGNOSIS — Z1152 Encounter for screening for COVID-19: Secondary | ICD-10-CM | POA: Insufficient documentation

## 2022-02-17 DIAGNOSIS — Z20828 Contact with and (suspected) exposure to other viral communicable diseases: Secondary | ICD-10-CM | POA: Insufficient documentation

## 2022-02-17 DIAGNOSIS — Z20822 Contact with and (suspected) exposure to covid-19: Secondary | ICD-10-CM | POA: Insufficient documentation

## 2022-02-17 LAB — URINALYSIS WITH REFLEX TO MICROSCOPIC
Blood,UA: NEGATIVE
Glucose,UA: NEGATIVE
Nitrite,UA: NEGATIVE
Specific Gravity,UA: 1.031 — ABNORMAL HIGH (ref 1.002–1.030)
pH,UA: 5.5 (ref 5.0–8.0)

## 2022-02-17 LAB — COMPREHENSIVE METABOLIC PANEL
ALT: 33 U/L (ref 0–50)
AST: 31 U/L (ref 0–50)
Albumin: 4.3 g/dL (ref 3.5–5.2)
Alk Phos: 95 U/L (ref 40–130)
Anion Gap: 13 (ref 7–16)
Bilirubin,Total: 0.8 mg/dL (ref 0.0–1.2)
CO2: 26 mmol/L (ref 20–28)
Calcium: 9.5 mg/dL (ref 8.6–10.2)
Chloride: 102 mmol/L (ref 96–108)
Creatinine: 1.36 mg/dL — ABNORMAL HIGH (ref 0.67–1.17)
Glucose: 97 mg/dL (ref 60–99)
Lab: 25 mg/dL — ABNORMAL HIGH (ref 6–20)
Potassium: 4.1 mmol/L (ref 3.3–5.1)
Sodium: 141 mmol/L (ref 133–145)
Total Protein: 6.4 g/dL (ref 6.3–7.7)
eGFR BY CREAT: 60 *

## 2022-02-17 LAB — POCT URINALYSIS DIPSTICK
Glucose,UA POCT: NORMAL mg/dL
Ketones,UA POCT: NEGATIVE mg/dL
Lot #: 64120403
Nitrite,UA POCT: NEGATIVE
PH,UA POCT: 7 (ref 5–8)
Protein,UA POCT: NEGATIVE mg/dL
Specific gravity,UA POCT: 1.02 (ref 1.002–1.030)

## 2022-02-17 LAB — CBC AND DIFFERENTIAL
Baso # K/uL: 0 10*3/uL (ref 0.0–0.2)
Basophil %: 0.8 %
Eos # K/uL: 0.1 10*3/uL (ref 0.0–0.5)
Eosinophil %: 1.6 %
Hematocrit: 47 % (ref 37–52)
Hemoglobin: 15.7 g/dL (ref 12.0–17.0)
IMM Granulocytes #: 0 10*3/uL (ref 0.0–0.0)
IMM Granulocytes: 0.2 %
Lymph # K/uL: 0.7 10*3/uL — ABNORMAL LOW (ref 1.0–5.0)
Lymphocyte %: 14 %
MCH: 31 pg (ref 26–32)
MCHC: 34 g/dL (ref 32–37)
MCV: 92 fL (ref 75–100)
Mono # K/uL: 0.8 10*3/uL (ref 0.1–1.0)
Monocyte %: 15.5 %
Neut # K/uL: 3.5 10*3/uL (ref 1.5–6.5)
Nucl RBC # K/uL: 0 10*3/uL (ref 0.0–0.0)
Nucl RBC %: 0 /100 WBC (ref 0.0–0.2)
Platelets: 177 10*3/uL (ref 150–450)
RBC: 5.1 MIL/uL (ref 4.0–6.0)
RDW: 14.2 % (ref 0.0–15.0)
Seg Neut %: 67.9 %
WBC: 5.2 10*3/uL (ref 3.5–11.0)

## 2022-02-17 LAB — UNABLE TO VOID

## 2022-02-17 LAB — COVID/INFLUENZA A & B/RSV NAAT (PCR): Influenza A NAAT (PCR): POSITIVE — AB

## 2022-02-17 LAB — AEROBIC CULTURE: Aerobic Culture: 0

## 2022-02-17 LAB — URINE MICROSCOPIC (IQ200)
Bacteria,UA: NONE SEEN
Hyaline Casts,UA: NONE SEEN /lpf (ref 0–5)

## 2022-02-17 NOTE — Progress Notes (Unsigned)
Subjective:     Patient ID: Jorge Boyd is a 62 y.o. male.    HPI  This is a 62 year-old male who presents w/ a fever.  PMH significant for HTN, a.fib, gout, BPH, UTI, and nephrolithiasis.    Symptoms started this AM-  Fever to 104*F earlier today  Associated w/ dry cough  Mid back hurts from coughing  Mild congestion, frontal headache  Also endorses general body aches  No SOB, dysuria, or hematuria       Allergies, medications, and problem list were reviewed with the patient and updated in Epic as appropriate.    Allergy History as of 02/17/22       MEPERIDINE         Noted Status Severity Type Reaction    07/10/20 1524 Telford Nab, LPN 16/10/96 Active High Allergy Nausea And Vomiting                    Current Outpatient Medications   Medication Sig    tadalafil (CIALIS) 5 MG tablet TAKE 1 TABLET (5 MG TOTAL) BY MOUTH DAILY.    TIADYLT ER 120 MG 24 hr capsule TAKE 1 CAPSULE (120 MG TOTAL) BY MOUTH DAILY FOR HIGH BLOOD PRESSURE DISORDER    potassium citrate (UROCIT-K) 10 mEq (1080 mg) CR tablet TAKE 1 TABLET (10 MEQ TOTAL) BY MOUTH DAILY FOR HX OF KIDNEY STONES    hydroCHLOROthiazide (HYDRODIURIL) 25 mg tablet TAKE 1 TABLET BY MOUTH EVERY DAY IN THE MORNING    tamsulosin (FLOMAX) 0.4 mg capsule Take 1 capsule (0.4 mg total) by mouth daily    allopurinol (ZYLOPRIM) 300 mg tablet TAKE 1 TABLET BY MOUTH EVERY DAY    polyethylene glycol (GLYCOLAX,MIRALAX) 17 g powder packet Take by mouth daily    acetaminophen (TYLENOL) 500 mg tablet Take 2 tablets (1,000 mg total) by mouth 3 times daily as needed  for Pain Following an Operation    aspirin 81 mg EC tablet Take 1 tablet (81 mg total) by mouth daily  Resume after DVT prophylaxis completed    Cholecalciferol (VITAMIN D) 125 MCG (5000 UT) CAPS Take 1 capsule by mouth daily  for Vitamin D Deficiency    biotin 5 MG tablet Take 1 tablet (5 mg total) by mouth daily  for Vitamin Deficiency    ascorbic acid (VITAMIN C) 1,000 mg tablet Take 1 tablet (1,000 mg total) by  mouth daily  for promote bone healing    Cranberry 4200 MG CAPS Take 1 capsule by mouth daily  for urinary issues    Zinc 50 MG TABS Take 1 tablet by mouth daily  for Zinc Deficiency    co-enzyme Q-10 100 MG oral solid Take 1 each (100 mg total) by mouth daily  for Heart Rhythm Disorder    Multiple Vitamin (MULTIVITAMIN PO) Take 1 tablet by mouth daily  for vitamin supplement    KRILL OIL PO Take 800 mg by mouth daily  for supplememt (Patient not taking: Reported on 02/17/2022)     No current facility-administered medications for this visit.       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   Constitutional:       General: He is not in acute distress.     Appearance: Normal appearance.   HENT:      Right Ear: Tympanic membrane, ear canal and external ear normal.      Left Ear: Tympanic membrane, ear canal and external ear normal.   Cardiovascular:      Rate and Rhythm: Normal rate and regular rhythm.      Heart sounds: Normal heart sounds.   Pulmonary:      Effort: Pulmonary effort is normal.      Breath sounds: Normal breath sounds.   Neurological:      Mental Status: He is alert and oriented to person, place, and time.   Psychiatric:         Mood and Affect: Mood normal.           Assessment / Plan:        Fever, unspecified fever cause / Cough  This is a 62 year-old male who presents with fever, congestion, cough, headache, and body aches for <24 hrs.  He is afebrile and hemodynamically stable in the office with an unremarkable exam.  Urine dipstick w/ trace leukocytes and  trace blood.  Suspect viral respiratory infection, such as influenza, but sent urine for culture to r/o UTI.  Encouraged supportive care while tests are pending.    -     CBC and differential; Future  -     Comprehensive metabolic panel; Future    -     COVID/Influenza A & B/RSV NAAT (PCR); Future    -     POCT Urinalysis Dipstick  -     Urinalysis with reflex to microscopic; Future  -     Aerobic culture (urine-voided); Future      Further evaluation and treatment dependent upon results of aforementioned testing

## 2022-02-17 NOTE — Patient Instructions (Signed)
If you have any questions or concerns about today's visit please contact the  Primary Care Digital Health Team at [phone]

## 2022-02-17 NOTE — Progress Notes (Signed)
TeleHome Video Encounter   Mode of Communication with Patient for This Visit    Mode of Communication with Patient for This Visit: Video  Patient Location: Home  Provider Location: Home       Visit date: 02/17/2022  Consent was obtained from the patient to complete this video visit; including the potential for financial liability.       Digital Health Team    Author: Joice Lofts, NP    PCP: [name], MD    Subjective:      Chief Complaint   Patient presents with    Fever    Cough       History of Present Illness:    Jorge Boyd is a 62 y.o.male who present for digital [employer]Dem[employer] video visit for evaluation of fever. Sx started last night. "I felt very hot" This morning even after 800 mg ibuprofen still running low grade temp at 99.8 F. Other sx include- left leg/hip pain and dry cough. No chest pain or SOB. Negative covid test at home. Wife has biposy this morning, prefers not to go in if possible.     Hisconcern- "Whenever I have fever I need to make sure I do not have UTI". Hx of sepsis secondary to pyelo- ESBL  E Coli. No urinary sx at this time    Also noticing left leg pain/hip pain- did have this hip replaced in August. Does not see any redness, warmth, skin changes. Concerned about infection.     Past Medical History:   Diagnosis Date    Atrial fibrillation     COVID-19 02/2019    Diverticulitis     Gout     Nephrolithiasis     Pyelonephritis 12/2018    ESBL E coli    Sepsis 12/2018    due to pyelonephritis     Shingles 2016       Past Surgical History:   Procedure Laterality Date    ANKLE FRACTURE SURGERY Left     COLONOSCOPY  11/06/2021    2 polys (tubular adenoma). severe diverticulosis. Dr Lennon Alstrom    FOREARM FRACTURE SURGERY Left     HIP REPLACEMENT Right 10/2019    in [facility]    INGUINAL HERNIA REPAIR Right 07/2017    INGUINAL HERNIA REPAIR Left 2012    LITHOTRIPSY  2021    x2    PR ARTHRP ACETBLR/PROX FEM PROSTC AGRFT/ALGRFT Left 09/09/2021    Procedure: LEFT ARTHROPLASTY, HIP, TOTAL, ANTERIOR  APPROACH;  Surgeon: Zella Ball, MD;  Location: [facility] MAIN OR;  Service: Orthopedics    UMBILICAL HERNIA REPAIR  07/2018    incisional hernia repaired                ALLERGIES:   Allergies   Allergen Reactions    Demerol Hcl [Meperidine] Nausea And Vomiting       CURRENT MEDICATIONS:   Current Outpatient Medications   Medication    tadalafil (CIALIS) 5 MG tablet    TIADYLT ER 120 MG 24 hr capsule    potassium citrate (UROCIT-K) 10 mEq (1080 mg) CR tablet    hydroCHLOROthiazide (HYDRODIURIL) 25 mg tablet    tamsulosin (FLOMAX) 0.4 mg capsule    allopurinol (ZYLOPRIM) 300 mg tablet    polyethylene glycol (GLYCOLAX,MIRALAX) 17 g powder packet    acetaminophen (TYLENOL) 500 mg tablet    aspirin 81 mg EC tablet    Cholecalciferol (VITAMIN D) 125 MCG (5000 UT) CAPS    biotin 5 MG tablet  ascorbic acid (VITAMIN C) 1,000 mg tablet    Cranberry 4200 MG CAPS    Zinc 50 MG TABS    co-enzyme Q-10 100 MG oral solid    KRILL OIL PO    Multiple Vitamin (MULTIVITAMIN PO)     No current facility-administered medications for this visit.             Objective:     Constitutional:       General: NAD,      Appearance: Well appearing   Pulmonary:      Effort: Pulmonary effort is normal. No increased WOB   Skin:     General: No rashes noted   Neurological:      Mental Status: alert and oriented to person, place, and time.   Psychiatric:         Behavior: Behavior normal.         Thought Content: Thought content normal.         Judgment: Judgment normal.         Recent Lab Results:    Results for orders placed or performed in visit on 11/27/21   [facility] Colonoscopy   Result Value Ref Range    [facility] COLONOSCOPY Done (!)        Assessment / Plan:   1. Fever, unspecified fever cause  2. Cough, unspecified type  3. Leg pain  4. History of pyelonephritis     Given medical history and sx reported recommend in person exam  Most likely viral URI with cough- given sx reported- would benefit from flu/covid/rsv pcr as he would be candidate for  antivirals   Physical assessment/urine cx also warranted given his history and sx   Not in any acute distress- OK to start with PCP office or UC if unable to be seen   PT in agreement with this plan   This visit fee to be waived               ORDERS:  Orders Placed This Encounter   No orders placed during this encounter.         Follow up: No follow-ups on file.  Author:   Joice Lofts, NP    [facility] Primary Care Digital Health Team  Note signed: 02/17/2022

## 2022-02-18 ENCOUNTER — Encounter: Payer: Self-pay | Admitting: Family Medicine

## 2022-02-18 LAB — COVID/INFLUENZA A & B/RSV NAAT (PCR)
COVID-19 NAAT (PCR): NEGATIVE
Influenza A NAAT (PCR): POSITIVE — AB
Influenza B NAAT (PCR): NEGATIVE
RSV NAAT (PCR): NEGATIVE

## 2022-02-18 MED ORDER — OSELTAMIVIR PHOSPHATE 75 MG PO CAPS *I*
75.0000 mg | ORAL_CAPSULE | Freq: Two times a day (BID) | ORAL | 0 refills | Status: AC
Start: 2022-02-18 — End: 2022-02-23

## 2022-02-18 NOTE — Telephone Encounter (Signed)
Addressed in different encounter.

## 2022-04-01 ENCOUNTER — Ambulatory Visit: Payer: BLUE CROSS/BLUE SHIELD | Admitting: Family Medicine

## 2022-04-01 ENCOUNTER — Ambulatory Visit: Payer: BLUE CROSS/BLUE SHIELD | Admitting: Primary Care

## 2022-04-10 ENCOUNTER — Other Ambulatory Visit: Payer: Self-pay | Admitting: Family Medicine

## 2022-04-11 ENCOUNTER — Other Ambulatory Visit: Payer: Self-pay | Admitting: Family Medicine

## 2022-04-11 NOTE — Telephone Encounter (Signed)
Last office visit with doctor:   09/29/2021  Last office visit with APP:   02/17/2022  Patients upcoming appointments:  No future appointments.  Recent Lab results:  GENERAL CHEMISTRY   Recent Labs     02/17/22  1216 09/10/21  0505 09/09/21  1626 08/31/21  0930 05/19/21  0953   NA 141  --  138 141 139   K 4.1 3.8 CANCELED 4.0 4.5   CL 102  --  103 102 101   CO2 26  --  '23 25 28   '$ GAP 13  --  '12 14 10   '$ UN 25*  --  19 23* 21*   CREAT 1.36*  --  0.92 1.06 1.14   GLU 97  --  150* 109* 108*   CA 9.5  --  9.0 10.0 9.8   URIC  --   --   --   --  6.1      LIPID PROFILE   Recent Labs     05/19/21  0953   CHOL 154   TRIG 112   HDL 31*   LDLC 101      LIVER PROFILE   Recent Labs     02/17/22  1216 08/31/21  0930 05/19/21  0953   ALT 33 28 28   AST '31 26 25   '$ ALK 95 120 100   TB 0.8 0.8 0.8      DIABETES THYROID   Recent Labs     08/31/21  0930   HA1C 5.7*    Recent Labs     05/19/21  0953   TSH 2.81         Pending/Orders Labs:  Lab Frequency Next Occurrence   * Hip LEFT AP and Lateral with pelvis Once 09/11/2021   Measles IgG antibody Once 09/29/2021   Hepatitis C antibody Once 09/29/2021   Hemoglobin A1c Once 02/01/2022   CBC Once 02/01/2022   Comprehensive metabolic panel Once [id number]   Lipid Panel (Reflex to Direct  LDL if Triglycerides more than 400) Once 02/01/2022   PSA (eff.05-2008) Once 02/01/2022   Uric acid Once 02/01/2022

## 2022-04-12 ENCOUNTER — Encounter: Payer: Self-pay | Admitting: Family Medicine

## 2022-04-13 ENCOUNTER — Other Ambulatory Visit: Payer: Self-pay

## 2022-04-13 ENCOUNTER — Ambulatory Visit: Payer: BLUE CROSS/BLUE SHIELD

## 2022-04-13 VITALS — BP 124/68 | HR 118 | Temp 98.3°F | Resp 20 | Ht 75.0 in | Wt 303.0 lb

## 2022-04-13 DIAGNOSIS — N5089 Other specified disorders of the male genital organs: Secondary | ICD-10-CM | POA: Insufficient documentation

## 2022-04-13 LAB — POCT URINALYSIS DIPSTICK
Glucose,UA POCT: NORMAL mg/dL
Ketones,UA POCT: NEGATIVE mg/dL
Leuk Esterase,UA POCT: 2 — AB
Lot #: 65249402
Nitrite,UA POCT: NEGATIVE
PH,UA POCT: 6 (ref 5–8)
Specific gravity,UA POCT: 1.02 (ref 1.002–1.030)
Urobilinogen,UA: 12 mg/dL — AB

## 2022-04-13 LAB — URINALYSIS WITH REFLEX TO MICROSCOPIC
Glucose,UA: NEGATIVE
Ketones, UA: NEGATIVE
Nitrite,UA: NEGATIVE
Specific Gravity,UA: 1.023 (ref 1.002–1.030)
pH,UA: 6 (ref 5.0–8.0)

## 2022-04-13 LAB — URINE MICROSCOPIC (IQ200)
Bacteria,UA: NONE SEEN
WBC,UA: >50 /hpf — AB (ref 0–5)

## 2022-04-13 MED ORDER — LEVOFLOXACIN 500 MG PO TABS *I*
500.0000 mg | ORAL_TABLET | Freq: Every day | ORAL | 0 refills | Status: DC
Start: 2022-04-13 — End: 2022-04-20

## 2022-04-13 NOTE — Progress Notes (Signed)
Chief complaint:    Chief Complaint   Patient presents with    Groin Swelling    Testicle Pain    Fever       HPI: Jorge Boyd is a 62 y.o. male with a history of atrial fibrillation, kidney stones, pyelonephritis, and osteoarthritis who presents today for left testicular pain and swelling.     Started 3/10 with an ache in the evening by the morning of 3/11 the left testicle was very swollen  Has been trying ice, NSAIDs, tylenol, elevation since it started.   Low grade fever  Does have hematuria  Does drink a lot of water - urinate constantly at baseline. Testicle is so swollen it is difficult to go to the bathroom  Pain is all over the scrotum.  Not a chance of STI. Monogamous relationship with wife for a long time.   HR has been 84-92 at night.   No chills  Did have a fever yesterday (101)      PMH:   Past Medical History:   Diagnosis Date    Atrial fibrillation     COVID-19 02/2019    Diverticulitis     Gout     Nephrolithiasis     Pyelonephritis 12/2018    ESBL E coli    Sepsis 12/2018    due to pyelonephritis     Shingles 2016       Medications:   Current Outpatient Medications   Medication Sig    hydroCHLOROthiazide (HYDRODIURIL) 25 mg tablet TAKE 1 TABLET BY MOUTH EVERY DAY IN THE MORNING    potassium citrate (UROCIT-K) 10 mEq (1080 mg) CR tablet TAKE 1 TABLET (10 MEQ TOTAL) BY MOUTH DAILY FOR HX OF KIDNEY STONES    tadalafil (CIALIS) 5 MG tablet TAKE 1 TABLET (5 MG TOTAL) BY MOUTH DAILY.    TIADYLT ER 120 MG 24 hr capsule TAKE 1 CAPSULE (120 MG TOTAL) BY MOUTH DAILY FOR HIGH BLOOD PRESSURE DISORDER    tamsulosin (FLOMAX) 0.4 mg capsule Take 1 capsule (0.4 mg total) by mouth daily    allopurinol (ZYLOPRIM) 300 mg tablet TAKE 1 TABLET BY MOUTH EVERY DAY    polyethylene glycol (GLYCOLAX,MIRALAX) 17 g powder packet Take by mouth daily    aspirin 81 mg EC tablet Take 1 tablet (81 mg total) by mouth daily  Resume after DVT prophylaxis completed    Cholecalciferol (VITAMIN D) 125 MCG (5000 UT) CAPS Take 1 capsule by mouth  daily  for Vitamin D Deficiency    biotin 5 MG tablet Take 1 tablet (5 mg total) by mouth daily  for Vitamin Deficiency    ascorbic acid (VITAMIN C) 1,000 mg tablet Take 1 tablet (1,000 mg total) by mouth daily  for promote bone healing    Cranberry 4200 MG CAPS Take 1 capsule by mouth daily  for urinary issues    Zinc 50 MG TABS Take 1 tablet by mouth daily  for Zinc Deficiency    co-enzyme Q-10 100 MG oral solid Take 1 each (100 mg total) by mouth daily  for Heart Rhythm Disorder    Multiple Vitamin (MULTIVITAMIN PO) Take 1 tablet by mouth daily  for vitamin supplement    levoFLOXacin (LEVAQUIN) 500 mg tablet Take 1 tablet (500 mg total) by mouth daily for 5 days  for Epididymoorchitis     No current facility-administered medications for this visit.     Allergies:   Allergies   Allergen Reactions    Demerol Hcl [Meperidine] Nausea And  Vomiting       Past medical history, problem list, medications and allergies personally reviewed in eRecord today.     ROS: as per HPI     BP 124/68 (BP Location: Left arm, Patient Position: Sitting, Cuff Size: large adult)   Pulse (!) 118   Temp 36.8 C (98.3 F) (Temporal)   Resp 20   Ht 1.905 m ('6\' 3"'$ )   Wt 137.4 kg (303 lb)   SpO2 95%   BMI 37.87 kg/m   Physical Exam  Cardiovascular:      Rate and Rhythm: Regular rhythm. Tachycardia present.      Heart sounds: Normal heart sounds.   Pulmonary:      Effort: Pulmonary effort is normal.      Breath sounds: Normal breath sounds.   Genitourinary:     Testes:         Right: Tenderness present. Swelling not present.         Left: Tenderness (left significant more swollen that right with erythema and warmth of left scrotum) and swelling present.      Epididymis:      Left: Tenderness present.   Skin:     General: Skin is warm.   Neurological:      Mental Status: He is alert and oriented to person, place, and time.   Psychiatric:         Mood and Affect: Mood normal.         Behavior: Behavior normal.          Assessment/Plan:    1. Testicular swelling, left  Jorge Boyd has a 2 day history of left testicular swelling and pain which has worsened.  He has also had a fever.  Exam today is significant for significant left testicular swelling with erythema and warmth of the scrotum.  Differential diagnoses include epididymitis, epididymo-orchitis. Given presentation, reviewed and examined with Dr. Benjie Karvonen. Low suspicion for testicular torsion given presentation and therefore will defer ultrasound for now with strict precautions to report to ED and he is in agreement with this. Low risk for STI. Declines STI testing. We will treat empirically for epididymitis or epididymo-orchitis with levofloxacin 500 mg once daily for 5 days. Urine obtained for culture and urinalysis.   - Urinalysis with reflex to microscopic; Future  - Aerobic culture (urine-voided); Future  - levoFLOXacin (LEVAQUIN) 500 mg tablet; Take 1 tablet (500 mg total) by mouth daily for 5 days  for Epididymoorchitis  Dispense: 5 tablet; Refill: 0       New Prescriptions    LEVOFLOXACIN (LEVAQUIN) 500 MG TABLET    Take 1 tablet (500 mg total) by mouth daily for 5 days  for Epididymoorchitis       There are no Patient Instructions on file for this visit.    Follow up if symptoms worsen or fail to improve.      Arelia Sneddon, NP

## 2022-04-14 LAB — AEROBIC CULTURE

## 2022-04-15 ENCOUNTER — Encounter: Payer: Self-pay | Admitting: Family Medicine

## 2022-04-15 MED ORDER — SULFAMETHOXAZOLE-TRIMETHOPRIM 800-160 MG PO TABS *I*
1.0000 | ORAL_TABLET | Freq: Two times a day (BID) | ORAL | 0 refills | Status: DC
Start: 2022-04-15 — End: 2022-04-20

## 2022-04-15 NOTE — Addendum Note (Signed)
Addended by: Yisroel Ramming on: 04/15/2022 09:44 AM     Modules accepted: Orders

## 2022-04-15 NOTE — Telephone Encounter (Addendum)
Writer reached out to the patient he is scheduled for his STAT Scrotal Ultra Sound for 04/16/22 at 2:45 pm. @ [address].    Patient agree with appointment time and date.

## 2022-04-15 NOTE — Telephone Encounter (Signed)
Called patient to review culture results.  Resistant to levofloxacin.  Stop levofloxacin.  Sensitive to Bactrim.  Start Bactrim DS twice daily x 10 days.  Side effects reviewed.  We will also obtain an ultrasound of the testicle given the length of time he has had a swollen and painful testicle to ensure adequate blood flow.  Again discussed red flags that would warrant ED evaluation.

## 2022-04-16 ENCOUNTER — Telehealth: Payer: Self-pay | Admitting: Family Medicine

## 2022-04-16 ENCOUNTER — Other Ambulatory Visit: Payer: Self-pay

## 2022-04-16 ENCOUNTER — Emergency Department: Payer: BLUE CROSS/BLUE SHIELD

## 2022-04-16 ENCOUNTER — Encounter: Payer: Self-pay | Admitting: Student in an Organized Health Care Education/Training Program

## 2022-04-16 ENCOUNTER — Ambulatory Visit
Admission: RE | Admit: 2022-04-16 | Discharge: 2022-04-16 | Disposition: A | Discharge status: Home / Self Care | Payer: BLUE CROSS/BLUE SHIELD | Source: Ambulatory Visit

## 2022-04-16 ENCOUNTER — Inpatient Hospital Stay
Admission: EM | Admit: 2022-04-16 | Discharge: 2022-04-20 | DRG: 501 | Disposition: A | Discharge status: Home / Self Care | Payer: BLUE CROSS/BLUE SHIELD | Source: Ambulatory Visit | Attending: Internal Medicine | Admitting: Internal Medicine

## 2022-04-16 DIAGNOSIS — R93812 Abnormal radiologic findings on diagnostic imaging of left testicle: Secondary | ICD-10-CM

## 2022-04-16 DIAGNOSIS — N453 Epididymo-orchitis: Principal | ICD-10-CM | POA: Diagnosis present

## 2022-04-16 DIAGNOSIS — N5089 Other specified disorders of the male genital organs: Secondary | ICD-10-CM | POA: Insufficient documentation

## 2022-04-16 DIAGNOSIS — M109 Gout, unspecified: Secondary | ICD-10-CM | POA: Diagnosis present

## 2022-04-16 DIAGNOSIS — N433 Hydrocele, unspecified: Secondary | ICD-10-CM

## 2022-04-16 DIAGNOSIS — L989 Disorder of the skin and subcutaneous tissue, unspecified: Secondary | ICD-10-CM

## 2022-04-16 DIAGNOSIS — I861 Scrotal varices: Secondary | ICD-10-CM

## 2022-04-16 DIAGNOSIS — N50812 Left testicular pain: Secondary | ICD-10-CM

## 2022-04-16 DIAGNOSIS — E559 Vitamin D deficiency, unspecified: Secondary | ICD-10-CM | POA: Diagnosis present

## 2022-04-16 DIAGNOSIS — N179 Acute kidney failure, unspecified: Secondary | ICD-10-CM | POA: Diagnosis present

## 2022-04-16 DIAGNOSIS — I10 Essential (primary) hypertension: Secondary | ICD-10-CM | POA: Diagnosis present

## 2022-04-16 DIAGNOSIS — N451 Epididymitis: Secondary | ICD-10-CM | POA: Diagnosis present

## 2022-04-16 DIAGNOSIS — L0390 Cellulitis, unspecified: Secondary | ICD-10-CM

## 2022-04-16 DIAGNOSIS — A419 Sepsis, unspecified organism: Principal | ICD-10-CM | POA: Diagnosis present

## 2022-04-16 DIAGNOSIS — B9620 Unspecified Escherichia coli [E. coli] as the cause of diseases classified elsewhere: Secondary | ICD-10-CM | POA: Diagnosis present

## 2022-04-16 DIAGNOSIS — I4891 Unspecified atrial fibrillation: Secondary | ICD-10-CM | POA: Diagnosis present

## 2022-04-16 DIAGNOSIS — N400 Enlarged prostate without lower urinary tract symptoms: Secondary | ICD-10-CM | POA: Diagnosis present

## 2022-04-16 LAB — CBC AND DIFFERENTIAL
Baso # K/uL: 0.1 10*3/uL (ref 0.0–0.2)
Basophil %: 0.4 %
Eos # K/uL: 0.2 10*3/uL (ref 0.0–0.5)
Eosinophil %: 1.4 %
Hematocrit: 43 % (ref 37–52)
Hemoglobin: 14.7 g/dL (ref 12.0–17.0)
IMM Granulocytes #: 0.1 10*3/uL — ABNORMAL HIGH (ref 0.0–0.0)
IMM Granulocytes: 0.7 %
Lymph # K/uL: 1.4 10*3/uL (ref 1.0–5.0)
Lymphocyte %: 10.3 %
MCH: 31 pg (ref 26–32)
MCHC: 35 g/dL (ref 32–37)
MCV: 89 fL (ref 75–100)
Mono # K/uL: 1.3 10*3/uL — ABNORMAL HIGH (ref 0.1–1.0)
Monocyte %: 9.4 %
Neut # K/uL: 10.5 10*3/uL — ABNORMAL HIGH (ref 1.5–6.5)
Nucl RBC # K/uL: 0 10*3/uL (ref 0.0–0.0)
Nucl RBC %: 0 /100 WBC (ref 0.0–0.2)
Platelets: 195 10*3/uL (ref 150–450)
RBC: 4.8 MIL/uL (ref 4.0–6.0)
RDW: 13.4 % (ref 0.0–15.0)
Seg Neut %: 77.8 %
WBC: 13.5 10*3/uL — ABNORMAL HIGH (ref 3.5–11.0)

## 2022-04-16 LAB — BASIC METABOLIC PANEL
Anion Gap: 16 (ref 7–16)
CO2: 23 mmol/L (ref 20–28)
Calcium: 9.2 mg/dL (ref 8.6–10.2)
Chloride: 97 mmol/L (ref 96–108)
Creatinine: 1.32 mg/dL — ABNORMAL HIGH (ref 0.67–1.17)
Glucose: 111 mg/dL — ABNORMAL HIGH (ref 60–99)
Lab: 22 mg/dL — ABNORMAL HIGH (ref 6–20)
Potassium: 3.4 mmol/L (ref 3.3–5.1)
Sodium: 136 mmol/L (ref 133–145)
eGFR BY CREAT: 62 *

## 2022-04-16 LAB — URINALYSIS REFLEX TO CULTURE
Glucose,UA: NEGATIVE
Ketones, UA: NEGATIVE
Nitrite,UA: NEGATIVE
Specific Gravity,UA: 1.027 (ref 1.002–1.030)
pH,UA: 6 (ref 5.0–8.0)

## 2022-04-16 LAB — URINE MICROSCOPIC (IQ200): Bacteria,UA: NONE SEEN

## 2022-04-16 MED ORDER — ACETAMINOPHEN 325 MG PO TABS *I*
975.0000 mg | ORAL_TABLET | Freq: Once | ORAL | Status: AC
Start: 2022-04-16 — End: 2022-04-16
  Administered 2022-04-16: 975 mg via ORAL
  Filled 2022-04-16: qty 3

## 2022-04-16 MED ORDER — DEXTROSE 5 % FLUSH FOR PUMPS *I*
0.0000 mL/h | INTRAVENOUS | Status: DC | PRN
Start: 2022-04-16 — End: 2022-04-20

## 2022-04-16 MED ORDER — SODIUM CHLORIDE 0.9 % FLUSH FOR PUMPS *I*
0.0000 mL/h | INTRAVENOUS | Status: DC | PRN
Start: 2022-04-16 — End: 2022-04-20

## 2022-04-16 MED ORDER — IOHEXOL 350 MG/ML (OMNIPAQUE) IV SOLN 500ML BOTTLE *I*
1.0000 mL | Freq: Once | INTRAVENOUS | Status: AC
Start: 2022-04-16 — End: 2022-04-16
  Administered 2022-04-16: 149 mL via INTRAVENOUS

## 2022-04-16 NOTE — Telephone Encounter (Signed)
Unable to reach by phone  Will send My Chart

## 2022-04-16 NOTE — Provider Consult (Addendum)
UROLOGY CONSULT NOTE    Asked by primary team to evaluate patient for left testicular pain/swelling.     HPI:  Jorge Boyd is a 62 y.o. male with a history of A. Fib, Diverticulitis, Hx ESBL E. Coli Pyelonephritis, Kidney stones s/p lithotripsy 2021, Inguinal hernia repair 2012 and 2019, who presents to [facility] ED today with left testicular pain and swelling.     Pt reports noting left scrotal swelling and tenderness on Sunday night.  Monday morning he noticed it was about the size of a small lemon and attempted to treat with ice, Motrin, and Tylenol. Pt was seen by his PCP on Tuesday, 3/12. He also reported low grade fevers and hematuria. He was started on Levaquin for epididymoorchitis. 3/12 Urine culture grew 10-50k ESBL E. Coli, Levo resistant, so he was changed to Bactrim DS on 3/14 he also noted having fevers and feeling terrible. Per telephone notes, pt was sent for a scrotal US that had findings concerning for testicular torsion, so he was referred to the ED. He reports overall compared to last night he does feel better currently.  He reports his urine has been approximately Ice-T colored.  He denies nausea, vomiting, diarrhea, constipation.  His only difficulty urinating is due to the swelling of his left hemiscrotum.    Scrotal US - Arterial flow to bilateral testicles visualized; hyperemia noted to left testicle and epididymis with complex hydrocele noted. Per radiology read "phases of torsion can have a similar appearance, recommend emergent urology consultation."     ROS:  As in HPI  Denies CP, SOB.    PMH:    Past Medical History:   Diagnosis Date    Atrial fibrillation     COVID-19 02/2019    Diverticulitis     Gout     Nephrolithiasis     Pyelonephritis 12/2018    ESBL E coli    Sepsis 12/2018    due to pyelonephritis     Shingles 2016       PSH:  Past Surgical History:   Procedure Laterality Date    ANKLE FRACTURE SURGERY Left     COLONOSCOPY  11/06/2021    2 polys (tubular adenoma). severe  diverticulosis. Dr Lennon Alstrom    FOREARM FRACTURE SURGERY Left     HIP REPLACEMENT Right 10/2019    in [facility]    INGUINAL HERNIA REPAIR Right 07/2017    INGUINAL HERNIA REPAIR Left 2012    LITHOTRIPSY  2021    x2    PR ARTHRP ACETBLR/PROX FEM PROSTC AGRFT/ALGRFT Left 09/09/2021    Procedure: LEFT ARTHROPLASTY, HIP, TOTAL, ANTERIOR APPROACH;  Surgeon: Zella Ball, MD;  Location: [facility] MAIN OR;  Service: Orthopedics    UMBILICAL HERNIA REPAIR  07/2018    incisional hernia repaired       Allergies:   Allergies   Allergen Reactions    Demerol Hcl [Meperidine] Nausea And Vomiting       Home Meds:  (Not in a hospital admission)      Current Medications:  Current Facility-Administered Medications   Medication Dose Route Frequency    sodium chloride 0.9 % FLUSH REQUIRED IF PATIENT HAS IV  0-500 mL/hr Intravenous PRN    dextrose 5 % FLUSH REQUIRED IF PATIENT HAS IV  0-500 mL/hr Intravenous PRN     Current Outpatient Medications   Medication    sulfamethoxazole-trimethoprim (BACTRIM DS,SEPTRA DS) 800-160 mg tablet    levoFLOXacin (LEVAQUIN) 500 mg tablet    hydroCHLOROthiazide (HYDRODIURIL) 25 mg  tablet    potassium citrate (UROCIT-K) 10 mEq (1080 mg) CR tablet    tadalafil (CIALIS) 5 MG tablet    TIADYLT ER 120 MG 24 hr capsule    tamsulosin (FLOMAX) 0.4 mg capsule    allopurinol (ZYLOPRIM) 300 mg tablet    polyethylene glycol (GLYCOLAX,MIRALAX) 17 g powder packet    aspirin 81 mg EC tablet    Cholecalciferol (VITAMIN D) 125 MCG (5000 UT) CAPS    biotin 5 MG tablet    ascorbic acid (VITAMIN C) 1,000 mg tablet    Cranberry 4200 MG CAPS    Zinc 50 MG TABS    co-enzyme Q-10 100 MG oral solid    Multiple Vitamin (MULTIVITAMIN PO)        Social History:   Social History     Socioeconomic History    Marital status: Married   Tobacco Use    Smoking status: Never    Smokeless tobacco: Never   Substance and Sexual Activity    Alcohol use: Not Currently    Drug use: Never    Sexual activity: Yes     Partners: Female     Comment:  monogamous   Social History Narrative    Spl[place] time between [place] and [place].    Lives with wife, Sofie Rower.     3 kids and 2 granddaughters.    Business Conservation officer, historic buildings.       Family Hx:  Family History   Adopted: Yes   Problem Relation Age of Onset    Anesthesia problems Neg Hx        Physical Exam:  24 Hr Vitals Ranges:          Most recent vitals:    There were no vitals taken for this visit.     General: Alert, appears stated age and cooperative, resting comfortably in bed  Chest: Unlabored respirations   Abdomen: Soft, non-distended, non-tender, no suprapubic tenderness, no guarding or rigidity.   GENITOURINARY: Normal penis with normal urethral meatus. No cutaneous lesions evident to glans, shaft.  Erythema entire scrotum and mons pubis (Patient reports mons erythema not previously present). Testicles are descended bilaterally. Swelling and induration surrounding left testicle no fluctuance or crepitus noted, tender to palpation over left hemiscrotum, particularly posteriorly and along the left spermatic cord, no hernia palpated. Right testicle smooth, round, non-tender without nodularity or suspicious masses.  Neuro: Gossly nonfocal, moves all extremities, speech fluent.   Psych: Affect pleasant, makes eye contact.     Labs:      Lab results: 02/17/22  1216 09/09/21  1626 08/31/21  0930 05/19/21  0953   WBC 5.2  --  6.5 4.9   Hemoglobin 15.7 15.1 16.4 15.9   Hematocrit 47 45 48 47   RBC 5.1  --  5.4 5.1   Platelets 177  --  220 227   Neut # K/uL 3.5  --   --  2.7   Lymph # K/uL 0.7*  --   --  1.3   Mono # K/uL 0.8  --   --  0.6   Eos # K/uL 0.1  --   --  0.2   Baso # K/uL 0.0  --   --  0.1   Seg Neut % 67.9  --   --  55.6   Lymphocyte % 14.0  --   --  26.8   Monocyte % 15.5  --   --  13.0  Eosinophil % 1.6  --   --  3.4   Basophil % 0.8  --   --  1.0               Lab results: 02/17/22  1216 09/10/21  0505 09/09/21  1626 08/31/21  0930   Sodium 141  --  138 141    Potassium 4.1 3.8 CANCELED 4.0   Chloride 102  --  103 102   CO2 26  --  23 25   UN 25*  --  19 23*   Creatinine 1.36*  --  0.92 1.06   Glucose 97  --  150* 109*   Calcium 9.5  --  9.0 10.0              Imaging:  US scrotum & contents with testicular doppler complete    Result Date: 04/16/2022  Enlarged heterogeneous left testicle and epididymis with increased vascularity consistent with epididymal orchitis. Complex hydrocele on the left. Scrotal skin thickening may represent cellulitis. Phases of torsion can have a similar appearance, recommend emergent urology consultation. Mild right varicocele. END OF IMPRESSION       [facility] submits this DICOM format image data and final report to the [facility], an independent secure electronic health information exchange, on a reciprocally searchable basis (with patient authorization) for a minimum of 12 months after exam date.      Assessment:  Jorge Boyd is a 62 y.o. male with a history of A. Fib, Diverticulitis, Hx ESBL E. Coli Pyelonephritis, Kidney stones s/p lithotripsy 2021, Inguinal hernia repair 2012 and 2019, who presents to [facility] ED today with left testicular pain and swelling that began 3/10. Pt was treated for epididymoorchitis by his PCP; urine cultures grew ESBL E. Coli sensitive to Bactrim. Pt was sent for a scrotal US that showed arterial flow to bilateral testicles, but had a concerning radiology read for possible torsion, so was referred to the ED. history and physical exam not particularly concerning for torsion, however infection present in scrotum/groin, primarily cellulitis at this point, Fournier's should be ruled out though.    Plan:  We recommend the following:    -Please obtain CT with and without contrast.  - Please keep patient NPO until fourniers ruled out.   - Please rack urine  (save a sample of each urination in a clear specimen container). Label with time and date so we can trend the color of the urine to assess his hematuria.  Please keep in bathroom or bedside.   - Recommend starting broad spectrum IV antibiotics.   - Urology will follow up above testing, further recommendations to follow.   - Please call Urology consult pager with any questions.

## 2022-04-16 NOTE — ED Notes (Signed)
Pt in ct 

## 2022-04-16 NOTE — ED Triage Notes (Signed)
Pt reports being diagnosed with a UTI on Tuesday and started on antibiotics, noticed swelling to his left testicle same day that has gradually become worse, yesterday pt with significant swelling and pain to his left testicle, today had an ultrasound concerning for testicular torsion, endorsing hematuria and continued pain and swelling, fevers at home all week 100-102 F.           Prehospital medications given: No

## 2022-04-16 NOTE — Telephone Encounter (Signed)
-----   Message from Jacqulynn Cadet, NP sent at 04/16/2022  3:53 PM EDT -----  Please call pt. It was recommended that he go to the ER earlier today. His testicular ultrasound is concerning for possible torsion. He needs to go to the ER ASAP

## 2022-04-16 NOTE — Telephone Encounter (Signed)
Can you make sure he went to an ER?

## 2022-04-16 NOTE — Progress Notes (Addendum)
Urology Progress Note  Attending: Raphael Gibney   LOS: 0 days  Prior    CT obtained no signs of necrotizing soft tissue infection, engorging left spermatic cord and engorgment of spermatic vessels concern for torsion    Repeat scrotal ultrasound ordered, increased blood flow to the left testicle of question on CT, with good waveforms, right testicle with good flow and good waveforms,                 Afebrile, Vital Signs Stable on room air  Discomfort on left testicle predominantly  No diet orders on file  Denies nausea,  vomiting    No racked urine at bedside    Subjective  Scheduled Medications   vancomycin  2,000 mg Intravenous Once     PRN Medications   sodium chloride  0-500 mL/hr Intravenous PRN    dextrose  0-500 mL/hr Intravenous PRN     Past Medical History:   Diagnosis Date    Atrial fibrillation     COVID-19 02/2019    Diverticulitis     Gout     Nephrolithiasis     Pyelonephritis 12/2018    ESBL E coli    Sepsis 12/2018    due to pyelonephritis     Shingles 2016     Past Surgical History:   Procedure Laterality Date    ANKLE FRACTURE SURGERY Left     COLONOSCOPY  11/06/2021    2 polys (tubular adenoma). severe diverticulosis. Dr Lennon Alstrom    FOREARM FRACTURE SURGERY Left     HIP REPLACEMENT Right 10/2019    in [facility]    INGUINAL HERNIA REPAIR Right 07/2017    INGUINAL HERNIA REPAIR Left 2012    LITHOTRIPSY  2021    x2    PR ARTHRP ACETBLR/PROX FEM PROSTC AGRFT/ALGRFT Left 09/09/2021    Procedure: LEFT ARTHROPLASTY, HIP, TOTAL, ANTERIOR APPROACH;  Surgeon: Zella Ball, MD;  Location: [facility] MAIN OR;  Service: Orthopedics    UMBILICAL HERNIA REPAIR  07/2018    incisional hernia repaired         OBJECTIVE:  Patient Vitals for the past 72 hrs:   BP Temp Temp src Pulse Resp SpO2   04/17/22 0155 140/90 37.7 C (99.8 F) Oral 102 -- 93 %   04/16/22 1925 (!) 168/105 36.8 C (98.2 F) TEMPORAL 91 16 100 %   04/16/22 1759 160/90 37.4 C (99.3 F) Oral 80 18 97 %       Date 04/16/22 0700 - 04/17/22 0659 04/17/22 0700 -  04/18/22 0659   Shift 0700-1459 1500-2259 2300-0659 24 Hour Total 0700-1459 1500-2259 2300-0659 24 Hour Total   INTAKE   IV Piggyback   30.8 30.8         Volume (mL) (ertapenem (INVanz) IV 1,000 mg)   30.8 30.8       Shift Total   30.8 30.8       OUTPUT   Shift Total           NET   30.8 30.8       Weight (kg)             GEN: NAD  HEENT: NCAT  CHEST: Nonlabored respirations on RA  ABD: Soft, nontender, nondistended, no flank pain  GU: Normal penis with normal urethral meatus. No cutaneous lesions evident to glans, shaft.  Erythema entire scrotum and mons pubis (Patient reports mons erythema not previously present). Testicles are descended bilaterally. Swelling and induration surrounding left testicle no fluctuance or  crepitus noted, tender to palpation over left hemiscrotum, particularly posteriorly and along the left spermatic cord, no hernia palpated. Right testicle smooth, round, non-tender without nodularity or suspicious masses.   NEURO/MOTOR: Moving all extremities spontaneously      EXTREMITIES: Warm and well perfused without edema  Patient provided permission to take the photos shown in the chart    Heme:  Recent Labs   Lab 04/16/22  1758   WBC 13.5*   Hemoglobin 14.7   Hematocrit 43   Platelets 195     Chem:  Recent Labs   Lab 04/16/22  1758   Sodium 136   Potassium 3.4   Chloride 97   CO2 23   UN 22*   Creatinine 1.32*   Glucose 111*   Calcium 9.2   No results for input(s): "FLCR" in the last 168 hours.No results for input(s): "PGLU" in the last 168 hours.  Cultures:  Aerobic Culture   Date Value Ref Range Status   04/16/2022 .  Preliminary   04/13/2022 Escherichia coli (!)  Final     Comment:      10,000/ml-50,000/ml  Isolate identified by MALDI-TOF  .     02/17/2022 .  Final     Imaging:  CT pelvis with contrast    Result Date: 04/17/2022  Asymmetrically enlarged and hyperenhancing left testicle with swirling of the left spermatic cord and engorgement of spermatic vessels. Findings are concerning for  testicular torsion. Another differential consideration is left epididymo-orchitis. Correlate with ultrasound Infiltration of the subcutaneous fat in the left inguinal region, compatible with cellulitis. No specific imaging findings to suggest necrotizing soft tissue infection. END IMPRESSION I have personally reviewed the images and the Resident's/Fellow's interpretation and agree with or edited the findings.       [facility] submits this DICOM format image data and final report to the [facility], an independent secure electronic health information exchange, on a reciprocally searchable basis (with patient authorization) for a minimum of 12 months after exam date.    US scrotum & contents with testicular doppler complete    Result Date: 04/16/2022  Enlarged heterogeneous left testicle and epididymis with increased vascularity consistent with epididymal orchitis. Complex hydrocele on the left. Scrotal skin thickening may represent cellulitis. Phases of torsion can have a similar appearance, recommend emergent urology consultation. Mild right varicocele. END OF IMPRESSION       [facility] submits this DICOM format image data and final report to the [facility], an independent secure electronic health information exchange, on a reciprocally searchable basis (with patient authorization) for a minimum of 12 months after exam date.     Assessment and Plan  A/P: Jorge Boyd is a 62 y.o. male with a history of A. Fib, Diverticulitis, Hx ESBL E. Coli Pyelonephritis, Kidney stones s/p lithotripsy 2021, Inguinal hernia repair 2012 and 2019, who presents to [facility] ED today with left testicular pain and swelling that began 3/10. Pt was treated for epididymoorchitis by his PCP; urine cultures grew ESBL positive E. Coli sensitive. Pt was sent for a scrotal US that showed arterial flow to bilateral testicles, but had a concerning radiology read for possible torsion, so was referred to the ED. Signs and symptoms more likely  epididymororchitis on ultrasound. History and physical exam not particularly concerning for torsion especially with good blood flow on ultrasound. However infection present in scrotum/groin, primarily cellulitis at this point. Fournier's is unlikely given no CT imaging findings at this time.     -  Recommend medicine admission   - Broad spectrum antibiotics, antibiotics per primary  - No acute urologic interventions at this time  - Please rack urine  (save a sample of each urination in a clear specimen container). Label with time and date so we can trend the color of the urine to assess his hematuria. Please keep in bathroom or bedside   - Urology will follow  - Please call urology consult pager with any questions    Note written by urology night float resident. During daytime, please contact Urology Consult Pager with any additional questions.      Diagnoses for this hospitalization include:  Left epididymoorchitis, scrotal cellulitis      Rechelle Niebla Chuk Caroleen Hamman, MD   Urology

## 2022-04-16 NOTE — ED Notes (Signed)
04/16/22 1600   Expected Patient   Date of notification 04/16/22   Time Notified 1600   Notified by Office/PCP  Marcelino Duster, Dr Cyril Mourning)   Pt Info note/Reason for sending Testicular US done today concerning for testicular torsion   Patient coming from Home

## 2022-04-16 NOTE — ED Provider Notes (Addendum)
History     Chief Complaint   Patient presents with    Testicle Pain     62 y/o male with h/o AF not on AC, nephrolithiasis/pyelonephritis, recurrent UTI presents with concern for left testicular swelling and pain x2 days in the setting of current epididymitis diagnosis on levofloxacin.  Patient was seen by PCP at symptom onset and he reported fevers (Tmax 101), hematuria, and diffuse scrotal pain/swelling at that time.  He was started on antibiotic and sent for outpatient ultrasound today, which was concerning for testicular torsion and was sent to ED for further evaluation.  He has been monogamous with his wife for many years and denies chances of STI.  Denies chest pain, shortness of breath, abdominal pain, N/V/D, constipation, dysuria.      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 severe diverticulosis. Dr Lennon Alstrom    FOREARM FRACTURE SURGERY Left     HIP REPLACEMENT Right 10/2019    in [facility]    INGUINAL HERNIA REPAIR Right 07/2017    INGUINAL HERNIA REPAIR Left 2012    LITHOTRIPSY  2021    x2    PR ARTHRP ACETBLR/PROX FEM PROSTC AGRFT/ALGRFT Left 09/09/2021    Procedure: LEFT ARTHROPLASTY, HIP, TOTAL, ANTERIOR APPROACH;  Surgeon: Zella Ball, MD;  Location: [facility] MAIN OR;  Service: Orthopedics    UMBILICAL HERNIA  REPAIR  07/2018    incisional hernia repaired          Social History     Tobacco Use    Smoking status: Never    Smokeless tobacco: Never   Substance Use Topics    Alcohol use: Not Currently    Drug use: Never             Review of Systems    Physical Exam     Triage Vitals  Triage Start: Start, (04/16/22 1641)  First Recorded BP: 160/90, Resp: 18, Temp: 37.4 ?C (99.3 ?F), Temp src: Oral Oxygen Therapy SpO2: 97 %, Oximetry Source: Rt Hand, O2 Device: None (Room air), Heart Rate: 80, (04/16/22 1759)  .      Physical Exam  Vitals and nursing note reviewed.   Constitutional:       General: He is not in acute distress.     Appearance: Normal appearance. He is not ill-appearing, toxic-appearing or diaphoretic.   HENT:      Head: Normocephalic and atraumatic.      Right Ear: External ear normal.  Left Ear: External ear normal.      Nose: Nose normal.      Mouth/Throat:      Mouth: Mucous membranes are moist.   Eyes:      Extraocular Movements: Extraocular movements intact.      Conjunctiva/sclera: Conjunctivae normal.      Pupils: Pupils are equal, round, and reactive to light.   Cardiovascular:      Rate and Rhythm: Normal rate and regular rhythm.      Pulses: Normal pulses.   Pulmonary:      Effort: Pulmonary effort is normal. No respiratory distress.   Abdominal:      General: There is no distension.      Palpations: Abdomen is soft.      Tenderness: There is no abdominal tenderness. There is no right CVA tenderness, left CVA tenderness, guarding or rebound.   Genitourinary:     Testes:         Right: Swelling present.         Left: Tenderness and swelling present.      Comments: Scrotum is diffusely swollen with erythema, induration, and tenderness to palpation over left testicle, unable to easily palpate bilateral testes due to significant swelling; tender to palpation over left groin; no crepitus or other evidence of Fournier's gangrene  Musculoskeletal:         General: No swelling or deformity. Normal range  of motion.      Cervical back: Normal range of motion. No rigidity.   Skin:     General: Skin is warm and dry.      Capillary Refill: Capillary refill takes less than 2 seconds.   Neurological:      General: No focal deficit present.      Mental Status: He is alert and oriented to person, place, and time. Mental status is at baseline.   Psychiatric:         Mood and Affect: Mood normal.         Behavior: Behavior normal.         Medical Decision Making   Patient seen by me on:  04/16/2022    Assessment:  62 y/o male with h/o AF not on AC, nephrolithiasis/pyelonephritis, recurrent UTI presents with concern for left testicular swelling and pain x2 days in the setting of current epididymitis diagnosis on levofloxacin.  On arrival, patient is generally well-appearing in no acute distress, VSS/afebrile.  Physical exam significant for a diffusely swollen scrotum with tenderness, erythema, and induration over the left testicle.  Plan to obtain CT pelvis, basic labs, and urinalysis to evaluate for emergent causes of testicular pain/swelling and will consult urology due to concern for testicular torsion on today's ultrasound.  Patient requests only Tylenol for analgesia at this time.    Differential diagnosis:  Testicular torsion, epididymitis, orchitis, cellulitis, lower suspicion for nephrolithiasis, pyelonephritis, Fournier's gangrene, STI, incarcerated hernia    Plan:  Labs: CBC, BMP, UA  Imaging: CT pelvis  Medications: Tylenol  Consults: urology           ED Course as of 04/16/22 2259   Fri Apr 16, 2022   1714 Urology paged for consultation.   1946 WBC(!): 13.5   1946 Neut # K/uL(!): 10.5   1946 Creatinine(!): 1.32  Similar to 3 months ago   1946 Basic metabolic panel(!)  Reassuring   1946 Appearance,UR(!): Cloudy   1946 Leuk Esterase,UA(!): 1+   1946 Blood,UA(!): 1+   2018 Bacteria,UA: None Seen   2248  CT pelvis with contrast  Asymmetrically enlarged and hyperenhancing left testicle with swirling of the left spermatic  cord and engorgement of spermatic vessels. Findings are concerning for testicular torsion. Another differential consideration is left epididymo-orchitis.   Infiltration of the subcutaneous fat in the left inguinal region, compatible with cellulitis. No specific imaging findings to suggest necrotizing soft tissue infection.    2249 Pt signed out to oncoming team pending CT pelvis results and urology final recommendations.       Talbert Cage, MD      Resident Attestation:    Patient seen by me on 04/16/2022.  I saw and evaluated the patient. I agree with the resident's/fellow's findings and plan of care as documented above.  Additional attestation comments:  Worked with urology and radiology to expedite imaging and recommendations given delayed presentation and concern for torsion vs fourniers gangrene. At the time of signout CT completed per urology recommendations pending their final recommendations.     Author:  Clearance Coots, MD         Talbert Cage, MD  Resident  04/16/22 4540       Clearance Coots, MD  04/17/22 [phone]

## 2022-04-16 NOTE — Telephone Encounter (Signed)
I spoke with the patient to advise

## 2022-04-16 NOTE — ED Provider Progress Notes (Addendum)
I assumed care of the patient at signout. Jorge Boyd is a 62 y.o. male with a history of hypertension, atrial fibrillation, nephrolithiasis, BPH, recent diagnosis of UTI under evaluation for testicular torsion.  He reports progressive left testicular swelling.  Ultrasound imaging at OSH was suggestive of torsion    The initial impression by urology was the patient's symptoms are most consistent with epididymoorchitis.  There was concern for overlying cellulitis which may progress to Fournier's gangrene for which they recommended antibiotics.  He was empirically treated with vancomycin, clindamycin, ertapenem given prior ESBL.    CT imaging demonstrated concern for testicular torsion.  Repeat evaluation by urology and scrotal ultrasound were less supportive.    Case was discussed with hospital medicine who admitted the patient for further monitoring and intravenous antibiotics.    ED Course as of 04/17/22 1134   Sat Apr 17, 2022   0017 WBC(!): 13.5   0017 Hemoglobin: 14.7   0017 Hematocrit: 43   0017 Appearance,UR(!): Cloudy   0017 Leuk Esterase,UA(!): 1+   0018 WBC,UA(!): 11-20   0018 Bacteria,UA: None Seen   0018 Sodium: 136   0018 Potassium: 3.4   0018 UN(!): 22   0018 Creatinine(!): 1.32   0111 Urology paged   0425 US scrotum & contents with testicular doppler complete  Hyperemic left testicle and epididymis which may be seen with epididymoorchitis. A complex left hydrocele is present and may represent pyocele in the context of infection.   1610 Medicine paged          Lovenia Kim, MD,PhD  Resident  04/16/22 2349       Jailen Lung, Patrick North, MD,PhD  Resident  04/17/22 [phone]

## 2022-04-17 ENCOUNTER — Emergency Department: Payer: BLUE CROSS/BLUE SHIELD | Admitting: Radiology

## 2022-04-17 ENCOUNTER — Encounter: Payer: Self-pay | Admitting: Student in an Organized Health Care Education/Training Program

## 2022-04-17 ENCOUNTER — Encounter: Payer: Self-pay | Admitting: Internal Medicine

## 2022-04-17 DIAGNOSIS — N451 Epididymitis: Secondary | ICD-10-CM | POA: Diagnosis present

## 2022-04-17 DIAGNOSIS — I861 Scrotal varices: Secondary | ICD-10-CM

## 2022-04-17 LAB — MRSA NAAT (PCR): MRSA NAAT (PCR): NEGATIVE

## 2022-04-17 LAB — BASIC METABOLIC PANEL
Anion Gap: 14 (ref 7–16)
CO2: 22 mmol/L (ref 20–28)
Calcium: 8.5 mg/dL — ABNORMAL LOW (ref 8.6–10.2)
Chloride: 101 mmol/L (ref 96–108)
Creatinine: 1.11 mg/dL (ref 0.67–1.17)
Glucose: 114 mg/dL — ABNORMAL HIGH (ref 60–99)
Lab: 17 mg/dL (ref 6–20)
Potassium: 3.6 mmol/L (ref 3.3–5.1)
Sodium: 137 mmol/L (ref 133–145)
eGFR BY CREAT: 76 *

## 2022-04-17 LAB — AEROBIC CULTURE: Aerobic Culture: 0

## 2022-04-17 LAB — LACTATE, PLASMA: Lactate: 1.1 mmol/L (ref 0.5–2.2)

## 2022-04-17 MED ORDER — SENNOSIDES 8.6 MG PO TABS *I*
1.0000 | ORAL_TABLET | Freq: Every evening | ORAL | Status: DC
Start: 2022-04-17 — End: 2022-04-20
  Administered 2022-04-17: 1 via ORAL
  Filled 2022-04-17 (×2): qty 1

## 2022-04-17 MED ORDER — BIOTIN 5 MG PO CAPS *WRAPPED*
1.0000 | ORAL_CAPSULE | Freq: Every day | ORAL | Status: DC
Start: 2022-04-17 — End: 2022-04-20
  Administered 2022-04-17 – 2022-04-20 (×4): 5 mg via ORAL
  Filled 2022-04-17 (×5): qty 1

## 2022-04-17 MED ORDER — POTASSIUM CITRATE 1080 MG PO TBCR *I*
10.0000 meq | ORAL_TABLET | Freq: Every day | ORAL | Status: DC
Start: 2022-04-17 — End: 2022-04-20
  Administered 2022-04-17 – 2022-04-20 (×4): 10 meq via ORAL
  Filled 2022-04-17 (×5): qty 1

## 2022-04-17 MED ORDER — OXYCODONE HCL 10 MG PO TABS *I*
10.0000 mg | ORAL_TABLET | Freq: Four times a day (QID) | ORAL | Status: DC | PRN
Start: 2022-04-17 — End: 2022-04-19

## 2022-04-17 MED ORDER — CHOLECALCIFEROL 5000 UNIT PO CAPS *I*
1.0000 | ORAL_CAPSULE | Freq: Every day | ORAL | Status: DC
Start: 2022-04-17 — End: 2022-04-20
  Administered 2022-04-17 – 2022-04-20 (×4): 5000 [IU] via ORAL
  Filled 2022-04-17 (×5): qty 1

## 2022-04-17 MED ORDER — OXYCODONE HCL 5 MG PO TABS *I*
5.0000 mg | ORAL_TABLET | Freq: Four times a day (QID) | ORAL | Status: DC | PRN
Start: 2022-04-17 — End: 2022-04-19

## 2022-04-17 MED ORDER — CLINDAMYCIN PHOSPHATE IN D5W 900 MG/50ML IV SOLN *I*
900.0000 mg | Freq: Three times a day (TID) | INTRAVENOUS | Status: DC
Start: 2022-04-17 — End: 2022-04-17
  Administered 2022-04-17: 900 mg via INTRAVENOUS
  Filled 2022-04-17 (×3): qty 50

## 2022-04-17 MED ORDER — ENOXAPARIN SODIUM 40 MG/0.4ML IJ SOSY *I*
40.0000 mg | PREFILLED_SYRINGE | Freq: Two times a day (BID) | INTRAMUSCULAR | Status: DC
Start: 2022-04-17 — End: 2022-04-20
  Administered 2022-04-17 – 2022-04-20 (×7): 40 mg via SUBCUTANEOUS
  Filled 2022-04-17 (×7): qty 0.4

## 2022-04-17 MED ORDER — SODIUM CHLORIDE 0.9 % FLUSH FOR PUMPS *I*
0.0000 mL/h | INTRAVENOUS | Status: DC | PRN
Start: 2022-04-17 — End: 2022-04-17

## 2022-04-17 MED ORDER — ERTAPENEM IN NS 1 GM *I*
1000.0000 mg | Freq: Once | INTRAVENOUS | Status: AC
Start: 2022-04-17 — End: 2022-04-17
  Administered 2022-04-17: 1000 mg via INTRAVENOUS
  Filled 2022-04-17: qty 50

## 2022-04-17 MED ORDER — ERTAPENEM IN NS 1 GM *I*
1000.0000 mg | INTRAVENOUS | Status: DC
Start: 2022-04-18 — End: 2022-04-19
  Administered 2022-04-18 – 2022-04-19 (×2): 1000 mg via INTRAVENOUS
  Filled 2022-04-17: qty 1
  Filled 2022-04-17: qty 50
  Filled 2022-04-17: qty 1

## 2022-04-17 MED ORDER — VANCOMYCIN HCL IN NACL 2000 MG/550 ML IV SOLN *I*
2000.0000 mg | Freq: Once | INTRAVENOUS | Status: AC
Start: 2022-04-17 — End: 2022-04-17
  Administered 2022-04-17: 2000 mg via INTRAVENOUS
  Filled 2022-04-17: qty 550

## 2022-04-17 MED ORDER — VANCOMYCIN HCL IN NACL 1500 MG/275 ML IV SOLN *I*
1500.0000 mg | Freq: Two times a day (BID) | INTRAVENOUS | Status: DC
Start: 2022-04-17 — End: 2022-04-17

## 2022-04-17 MED ORDER — POLYETHYLENE GLYCOL 3350 PO PACK 17 GM *I*
17.0000 g | PACK | Freq: Every day | ORAL | Status: DC
Start: 2022-04-17 — End: 2022-04-20
  Administered 2022-04-17: 17 g via ORAL
  Filled 2022-04-17 (×4): qty 17

## 2022-04-17 MED ORDER — DILTIAZEM HCL COATED BEADS 120 MG PO CP24 *I*
120.0000 mg | ORAL_CAPSULE | Freq: Every day | ORAL | Status: DC
Start: 2022-04-17 — End: 2022-04-20
  Administered 2022-04-17 – 2022-04-20 (×4): 120 mg via ORAL
  Filled 2022-04-17 (×5): qty 1

## 2022-04-17 MED ORDER — TAMSULOSIN HCL 0.4 MG PO CAPS *I*
0.4000 mg | ORAL_CAPSULE | Freq: Every day | ORAL | Status: DC
Start: 2022-04-17 — End: 2022-04-20
  Administered 2022-04-17 – 2022-04-20 (×4): 0.4 mg via ORAL
  Filled 2022-04-17 (×4): qty 1

## 2022-04-17 MED ORDER — VANCOMYCIN IV - PHARMACIST TO DOSE PLACEHOLDER *I*
Status: DC
Start: 2022-04-18 — End: 2022-04-19

## 2022-04-17 MED ORDER — ACETAMINOPHEN 500 MG PO TABS *I*
1000.0000 mg | ORAL_TABLET | Freq: Three times a day (TID) | ORAL | Status: DC | PRN
Start: 2022-04-17 — End: 2022-04-17

## 2022-04-17 MED ORDER — VITAMIN C 500 MG PO TABS *I*
1000.0000 mg | ORAL_TABLET | Freq: Every day | ORAL | Status: DC
Start: 2022-04-17 — End: 2022-04-20
  Administered 2022-04-17 – 2022-04-20 (×4): 1000 mg via ORAL
  Filled 2022-04-17 (×5): qty 2

## 2022-04-17 MED ORDER — VANCOMYCIN HCL IN NACL 1500 MG/275 ML IV SOLN *I*
1500.0000 mg | Freq: Two times a day (BID) | INTRAVENOUS | Status: DC
Start: 2022-04-17 — End: 2022-04-18
  Administered 2022-04-17 – 2022-04-18 (×2): 1500 mg via INTRAVENOUS
  Filled 2022-04-17: qty 275
  Filled 2022-04-17 (×2): qty 1500
  Filled 2022-04-17: qty 275

## 2022-04-17 MED ORDER — MULTI-VITAMINS PO TABS *I*
1.0000 | ORAL_TABLET | Freq: Every day | ORAL | Status: DC
Start: 2022-04-17 — End: 2022-04-20
  Administered 2022-04-17 – 2022-04-20 (×4): 1 via ORAL
  Filled 2022-04-17 (×4): qty 1

## 2022-04-17 MED ORDER — COENZYME Q10 100 MG ORAL SOLID WRAPPED *I*
100.0000 mg | Freq: Every day | ORAL | Status: DC
Start: 2022-04-17 — End: 2022-04-20
  Administered 2022-04-17 – 2022-04-20 (×4): 100 mg via ORAL
  Filled 2022-04-17 (×5): qty 1

## 2022-04-17 MED ORDER — LACTATED RINGERS IV BOLUS *I*
1000.0000 mL | Freq: Once | INTRAVENOUS | Status: AC
Start: 2022-04-17 — End: 2022-04-17
  Administered 2022-04-17: 1000 mL via INTRAVENOUS

## 2022-04-17 MED ORDER — ALLOPURINOL 100 MG PO TABS *I*
300.0000 mg | ORAL_TABLET | Freq: Every day | ORAL | Status: DC
Start: 2022-04-17 — End: 2022-04-20
  Administered 2022-04-17 – 2022-04-20 (×4): 300 mg via ORAL
  Filled 2022-04-17 (×4): qty 3

## 2022-04-17 MED ORDER — HYDROCHLOROTHIAZIDE 12.5 MG PO CAPS *I*
25.0000 mg | ORAL_CAPSULE | Freq: Every day | ORAL | Status: DC
Start: 2022-04-17 — End: 2022-04-20
  Administered 2022-04-17 – 2022-04-20 (×4): 25 mg via ORAL
  Filled 2022-04-17 (×4): qty 2

## 2022-04-17 MED ORDER — POLYETHYLENE GLYCOL 3350 PO PACK 17 GM *I*
17.0000 g | PACK | Freq: Every day | ORAL | Status: DC
Start: 2022-04-17 — End: 2022-04-17

## 2022-04-17 MED ORDER — DEXTROSE 5 % FLUSH FOR PUMPS *I*
0.0000 mL/h | INTRAVENOUS | Status: DC | PRN
Start: 2022-04-17 — End: 2022-04-17

## 2022-04-17 MED ORDER — ASPIRIN 81 MG PO TBEC *I*
81.0000 mg | DELAYED_RELEASE_TABLET | Freq: Every day | ORAL | Status: DC
Start: 2022-04-17 — End: 2022-04-20
  Administered 2022-04-17 – 2022-04-20 (×4): 81 mg via ORAL
  Filled 2022-04-17 (×4): qty 1

## 2022-04-17 MED ORDER — ACETAMINOPHEN 500 MG PO TABS *I*
1000.0000 mg | ORAL_TABLET | Freq: Three times a day (TID) | ORAL | Status: DC
Start: 2022-04-17 — End: 2022-04-20
  Administered 2022-04-17 – 2022-04-20 (×10): 1000 mg via ORAL
  Filled 2022-04-17 (×10): qty 2

## 2022-04-17 MED ORDER — CLINDAMYCIN PHOSPHATE IN D5W 900 MG/50ML IV SOLN *I*
900.0000 mg | Freq: Once | INTRAVENOUS | Status: AC
Start: 2022-04-17 — End: 2022-04-17
  Administered 2022-04-17: 900 mg via INTRAVENOUS
  Filled 2022-04-17: qty 50

## 2022-04-17 NOTE — ED Notes (Signed)
Report Given To  Melina Copa  Rn       Descriptive Sentence / Reason for Admission   Pt dx with uti on tues-started antibiotics-noticed swelling to left testicle worse today and pain      Active Issues / Relevant Events   Full code  A&Ox4  IV R AC 20g  Being tx with levaquin for epididymoorchitris then changed to bactrim now having fevers and feeling terrible.  Recvd iv abx       To Do List  VS/A  Meds per mar  Pending [place] to R/o testicular torsion       Anticipatory Guidance / Discharge Planning  Pending er work up

## 2022-04-17 NOTE — ED Notes (Signed)
Report Given To  G16, RN      Descriptive Sentence / Reason for Admission   Pt dx with uti on tues-started antibiotics-noticed swelling to left testicle worse today and pain      Active Issues / Relevant Events   Full code  A&Ox4  IV R AC 20g  Being tx with levaquin for epididymoorchitris then changed to bactrim now having fevers and feeling terrible.  IV abx infusing(discontinued)  LR running      To Do List  VS/A  Meds per mar  Pending [place] to R/o testicular torsion   Labs      Anticipatory Guidance / Discharge Planning  Admit for Epididymitis

## 2022-04-17 NOTE — Bed Hold Note (Signed)
Bed: G16-H06  Expected date: 04/17/22  Expected time:   Means of arrival:   Comments:  [place]

## 2022-04-17 NOTE — H&P (Signed)
Hospital Medicine Admission Note      Chief Complaint:     left testicular pain and swelling    History of Present Illness:     Jorge Boyd is a 62 y.o. male with a history of Afib (in critical illness and not on [facility]), osteoarthritis, recurrent UTI (about 3 times a year since 2016), hx of ESBL E. Coli Pyelonephritis, nephrolithiasis s/p lithotripsy, left inguinal hernia repair,   who presented with  left testicular pain and swelling.     Patient states that left scrotal swelling and aching began on 3/11. Around this time, his scrotum began swelling which he took Motrin and Tylenol. He went to his PCP and was started on ciprofloxacin for epididmyoorchitis (he was also having chills and hematuria). Due to the UA growing E. Coli resistant to ciprofloxacin and was started on Bactrim for 1.5 days before coming into the ED.     At bedside he feels pretty well. He denies chest pain, shortness of breath, fevers or chills, dysuria, nausea.     Works for a [employer], no tobacco use, rare alcohol use, no recreational drug use. Lives with his wife of two years. Monogamous.     Past Medical History:     Past Medical History:   Diagnosis Date    Atrial fibrillation     COVID-19 02/2019    Diverticulitis     Gout     Nephrolithiasis     Pyelonephritis 12/2018    ESBL E coli    Sepsis 12/2018    due to pyelonephritis     Shingles 2016       Social History:     Social History     Socioeconomic History    Marital status: Married   Tobacco Use    Smoking status: Never    Smokeless tobacco: Never   Substance and Sexual Activity    Alcohol use: Not Currently    Drug use: Never    Sexual activity: Yes     Partners: Female     Comment: monogamous   Social History Narrative    Spl[place] time between [place] and [place].    Lives with wife, Jorge Boyd.     3 kids and 2 granddaughters.    Business Conservation officer, historic buildings.         Family History:     Family History   Adopted: Yes   Problem Relation Age of  Onset    Anesthesia problems Neg Hx        Allergies:     Allergies   Allergen Reactions    Demerol Hcl [Meperidine] Nausea And Vomiting       Review of Systems:     As in HPI     Home Medications:     Current Outpatient Medications   Medication Instructions    allopurinol (ZYLOPRIM) 300 mg, Oral, DAILY    ascorbic acid (VITAMIN C) 1,000 mg tablet 1 tablet, Oral, DAILY    aspirin 81 mg, Oral, DAILY, Resume after DVT prophylaxis completed    biotin 5 MG tablet 1 tablet, Oral, DAILY    Cholecalciferol (VITAMIN D) 125 MCG (5000 UT) CAPS 1 capsule, Oral, DAILY    co-enzyme Q-10 100 MG oral solid 1 each, Oral, DAILY    Cranberry 4200 MG CAPS 1 capsule, Oral, DAILY    hydroCHLOROthiazide (HYDRODIURIL) 25 mg tablet TAKE 1 TABLET BY MOUTH EVERY DAY IN THE MORNING    levoFLOXacin (LEVAQUIN)  500 mg, Oral, DAILY    Multiple Vitamin (MULTIVITAMIN PO) 1 tablet, Oral, DAILY    polyethylene glycol (GLYCOLAX,MIRALAX) 17 g powder packet Oral, DAILY    potassium citrate (UROCIT-K) 10 mEq (1080 mg) CR tablet 10 mEq, Oral, DAILY    sulfamethoxazole-trimethoprim (BACTRIM DS,SEPTRA DS) 800-160 mg tablet 1 tablet, Oral, 2 TIMES DAILY    tadalafil (CIALIS) 5 mg, Oral, DAILY    tamsulosin (FLOMAX) 0.4 mg, Oral, DAILY    TIADYLT ER 120 MG 24 hr capsule TAKE 1 CAPSULE (120 MG TOTAL) BY MOUTH DAILY FOR HIGH BLOOD PRESSURE DISORDER    Zinc 50 MG TABS 1 tablet, Oral, DAILY       Physical Exam:     Vitals:    04/17/22 0155   BP: 140/90   Pulse: 102   Resp:    Temp: 37.7 ?C (99.8 ?F)     General: well-appearing, alert, NAD  HEENT: conjunctivae without injection or icterus, EOM intact  Neck: supple  Cardiovascular: S1,S2, RRR, no murmur/rubs/gallops  Pulmonary: non-labored, good air movement, CTAB without wheezes/crackles/rhonchi  Gastrointestinal: soft, non-tender, non-distended, no rigidity or guarding, no HSM or palpable masses, bowel sounds present  Extremities: trace edema bilaterally  GU: deferred exam with recent urology exam and photos in  chart  Neurologic: alert, answering questions appropriately, face symmetric, moving all extremities  Psychiatric: calm, appropriate affect    Recent studies:       BMP/Electrolytes CBC   Recent Labs     04/16/22  1758   Sodium 136   Potassium 3.4   Chloride 97   CO2 23   UN 22*   Creatinine 1.32*   Glucose 111*   Calcium 9.2    Recent Labs     04/16/22  1758   WBC 13.5*   Hemoglobin 14.7   Hematocrit 43   Platelets 195   MCV 89   RDW 13.4   Seg Neut % 77.8   Lymphocyte % 10.3   Monocyte % 9.4   Eosinophil % 1.4   Basophil % 0.4      Liver Function Cardiac Studies   No results for input(s): "AST", "ALT", "HTBIL", "TB", "DB" in the last 72 hours.    No components found with this basename: "ALKPHOS" No results for input(s): "CKTS", "TROPU", "TROP", "MCKMB", "CKMB" in the last 168 hours.    No components found with this basename: "RICKMBS"      No results for input(s): "PGLU" in the last 168 hours.    Imaging:   US scrotum & contents with testicular doppler complete    Result Date: 04/17/2022  Arterial flow is seen within both testicles. Note, however, that this does not rule out intermittent testicular torsion. Hyperemic left testicle and epididymis which may be seen with epididymoorchitis. A complex left hydrocele is present and may represent pyocele in the context of infection. Somewhat increased vascular flow to the right epididymis, which may be reactive versus sequela of mild epididymitis. Diffuse edema and hyperemia of the overlying scrotum, compatible with cellulitis. Bilateral varicoceles. END OF IMPRESSION    CT pelvis with contrast    Result Date: 04/17/2022  Asymmetrically enlarged and hyperenhancing left testicle with swirling of the left spermatic cord and engorgement of spermatic vessels. Findings are concerning for testicular torsion. Another differential consideration is left epididymo-orchitis. Correlate with ultrasound Infiltration of the subcutaneous fat in the left inguinal region, compatible with  cellulitis. No specific imaging findings to suggest necrotizing soft tissue infection. END IMPRESSION I have  personally reviewed the images and the Resident's/Fellow's interpretation and agree with or edited the findings.       [facility] submits this DICOM format image data and final report to the [facility], an independent secure electronic health information exchange, on a reciprocally searchable basis (with patient authorization) for a minimum of 12 months after exam date.    US scrotum & contents with testicular doppler complete    Result Date: 04/16/2022  Enlarged heterogeneous left testicle and epididymis with increased vascularity consistent with epididymal orchitis. Complex hydrocele on the left. Scrotal skin thickening may represent cellulitis. Phases of torsion can have a similar appearance, recommend emergent urology consultation. Mild right varicocele. END OF IMPRESSION       [facility] submits this DICOM format image data and final report to the [facility], an independent secure electronic health information exchange, on a reciprocally searchable basis (with patient authorization) for a minimum of 12 months after exam date.        Assessment/Plan:     Jyheim Arntzen is a 62 y.o. male with PMH of Afib, nephrolithiasis, osteoarthritis, pyelonephritis who presented with left testicular pain and swelling. Presentation concerning for epididymitis vs testicular torsion vs fournier gangrene.    #Sepsis 2/2 epididymororchitis initially concerned for possible torsion but ultrasound is reassuring as well as overall comfort of the patient on exam. Fournier gangrene could be considered as well but CT is without signs of necrotizing infection. Recent UA on 3/12 + E. Coli <100,000 CFU  -s/p ertapenem, clindamycin and vanc in ED (unclear which antibiotic to narrow to at this time, failed ciprofloxacin outpatient)   -Consider ID consult, can likely discontinue clindamycin without concern for [facility]  -Urology  consulted and recs appreciated   -c/w clindamycin, ertapenem, vanc for now   -Rack urine  -Blood cultures x 2  -UA    #Acute kidney injury   -Will give 1 L LR now and CTM     #HTN/Afib  -c/w diltiazem 120 m

## 2022-04-17 NOTE — Discharge Instructions (Addendum)
Admission Date: 04/16/2022   Discharge Date: 04/20/2022    Brief Summary of Your Hospital Course:   You came to the hospital after you had an outpatient ultrasound of your testicle that was concerning for torsion. We reached out to our colleagues in Urology who determined that you had adequate blood flow to your testicle, which is reassuring. We did note inflammation of your epididymis (part of spermatic cord on your testicle) and your left testicle, consistent with an infection that likely came about from your UTI. We had to look closely at what antibiotics we could use for this UTI given the bacteria's resistance pattern, and we determined that it is best treated by an IV medication called ertapenem.     We have treated you with this for 3 days, and our colleagues in infectious disease have recommended a total 14 day course of IV antibiotics with ertapenem (1000 mg daily) through 05/01/22. To be able to achieve this, we have placed a PICC line and have provided teaching and supplies.     You have met all the criteria for safe discharge. Thank you for entrusting [place] with your care.       Your Instructions:   Please schedule an appointment with your PCP in the next 1-2 weeks to discuss this hospitalization.    Attend your other appointments as scheduled.     04/26/2022 3:00 PM Maryjean Ka, NP [facility] [phone]     05/26/2022 8:00 AM Demetrios Loll, NP [facility] [phone]       New Medications:   Ertapenem 1000 mg daily through PICC line      Existing Medications:   Continue taking your medications as prescribed unless otherwise noted. A list has been provided for your convenience.      Recommended Activity:   Activity as tolerated         Anticipatory Guidance:   If you experience any increased scrotal swelling or pain, fever over 102, loss of sensation in scrotum/pelvic region:  Within the first 24 hours of discharge, please contact Beyers, Arbutus Leas, MD at  [phone].  Beyond 24 hours or more from discharge, please contact your primary care doctor, [name], MD,  at [phone].      It was wonderful to meet you and to participate in your care!

## 2022-04-17 NOTE — Progress Notes (Signed)
Vancomycin (initial dosing) - Pharmacist to Dose    Jorge Boyd is a 62 y.o. male who has been consulted for vancomycin dosing and is being treated for Empiric Therapy     Goal trough concentration is 10-15 mcg/mL.    SCr: 1.32      Lab results: 04/16/22  1758 02/17/22  1216 09/09/21  1626   Creatinine 1.32* 1.36* 0.92     BUN:       Lab results: 04/16/22  1758 02/17/22  1216 09/09/21  1626   UN 22* 25* 19       Estimated CrCl:  (SCr  up from baseline) ml/min    Will start patient on vancomycin once random vanco level has been obtained 12 hours after dose was gven Other. Next vancomycin concentration in 12 hours, order placed to be drawn on 04/17/22 at 1600.   Pharmacist will follow for changes in renal function, toxicity, and efficacy and order serum concentrations and creatinine as needed.    For questions contact [phone]    Levester Fresh, PharmD

## 2022-04-17 NOTE — Student Note (Signed)
Hospital Medicine Admission H&P Note    Admission Date: 04/16/2022  Current Date: 04/17/2022  CODE STATUS: Full Code    Chief Complaint:   Chief Complaint   Patient presents with    Testicle Pain       History of Present Illness:     Jorge Boyd is a 62 y.o. male with a PMH significant for recurrent UTIs, afib, and osteoarthritis who is presenting after having a concerning outpatient workup for possible torsion in the setting of a urine culture positive for e. Coli.     He describes that he has been feeling quite well up until the 11th, when he started to notice left-sided testicular swelling. He went to his primary who sent him for an ultrasound and started him on ciprofloxacin for suspected UTI. When the cultures resulted that showed resistance to ciprofloxacin, he was switched to bactrim. He then went for the ultrasound which was possibly concerning for torsion, so he came to the ED.     He has not had any burning with urination or increased frequency, nor has he had any headache or constipation. He reports being monogamous with his wife and does not report any new genital lesions changes outside of the swelling.     ED Course: Was sent for repeat US and CT which helped to rule out testicular torsion, started on ertapenem, clindamycin, and vancomycin. Also given a 1L bolus of LR.    Review of Systems:        Review of Systems   Constitutional:  Negative for chills and fever.   HENT:  Negative for congestion.    Respiratory:  Negative for cough.    Cardiovascular:  Negative for chest pain and palpitations.   Gastrointestinal:  Negative for abdominal pain, constipation, diarrhea, heartburn, melena, nausea and vomiting.   Genitourinary:  Negative for dysuria and urgency.        Left testicular swelling and pain     Neurological:  Negative for headaches.       Pertinent History:     Past Medical History:   Past Medical History:   Diagnosis Date    Atrial fibrillation     COVID-19 02/2019    Diverticulitis      Gout     Nephrolithiasis     Pyelonephritis 12/2018    ESBL E coli    Sepsis 12/2018    due to pyelonephritis     Shingles 2016     Past Surgical History:   Past Surgical History:   Procedure Laterality Date    ANKLE FRACTURE SURGERY Left     COLONOSCOPY  11/06/2021    2 polys (tubular adenoma). severe diverticulosis. Dr Lennon Alstrom    FOREARM FRACTURE SURGERY Left     HIP REPLACEMENT Right 10/2019    in [facility]    INGUINAL HERNIA REPAIR Right 07/2017    INGUINAL HERNIA REPAIR Left 2012    LITHOTRIPSY  2021    x2    PR ARTHRP ACETBLR/PROX FEM PROSTC AGRFT/ALGRFT Left 09/09/2021    Procedure: LEFT ARTHROPLASTY, HIP, TOTAL, ANTERIOR APPROACH;  Surgeon: Zella Ball, MD;  Location: [facility] MAIN [facility];  Service: Orthopedics    UMBILICAL HERNIA REPAIR  07/2018    incisional hernia repaired     Family History:   Family History   Adopted: Yes   Problem Relation Age of Onset    Anesthesia problems Neg Hx      Social History:   Social History     Socioeconomic  History    Marital status: Married   Tobacco Use    Smoking status: Never    Smokeless tobacco: Never   Substance and Sexual Activity    Alcohol use: Not Currently    Drug use: Never    Sexual activity: Yes     Partners: Female     Comment: monogamous   Social History Narrative    Spl[place] time between [place] and [place].    Lives with wife, Sofie Rower.     3 kids and 2 granddaughters.    Business Conservation officer, historic buildings.        Allergies/Reactions:   Allergies   Allergen Reactions    Demerol Hcl [Meperidine] Nausea And Vomiting     Home Medications:  Scheduled Meds:   senna  1 tablet Oral Nightly    acetaminophen  1,000 mg Oral TID    allopurinol  300 mg Oral Daily    ascorbic acid  1,000 mg Oral Daily    aspirin  81 mg Oral Daily    biotin  1 capsule Oral Daily    cholecalciferol (Vitamin D3)  1 capsule Oral Daily    co-enzyme Q-10  100 mg Oral Daily    multi-vitamin  1 tablet Oral Daily    polyethylene glycol  17 g Oral Daily    potassium citrate   10 mEq Oral Daily    tamsulosin  0.4 mg Oral Daily    dilTIAZem  120 mg Oral Daily    enoxaparin  40 mg Subcutaneous 2 times per day    [START ON 04/18/2022] ertapenem  1,000 mg Intravenous Q24H     Continuous Infusions:   [START ON 04/18/2022] Vancomycin - Pharmacist to Dose       PRN Meds:.sodium chloride, dextrose     Physical Examination:       Vital Signs:  Temp:  [36.8 ?C (98.2 ?F)-37.7 ?C (99.8 ?F)] 37.6 ?C (99.7 ?F)  Heart Rate:  [80-102] 96  Resp:  [16-18] 18  BP: (140-168)/(77-105) 142/77    Physical Exam:  Physical Exam  Constitutional:       General: He is not in acute distress.     Appearance: Normal appearance.      Comments: Well-appearing, sleeping in bed     HENT:      Nose: No congestion.   Eyes:      General: No scleral icterus.  Cardiovascular:      Rate and Rhythm: Normal rate and regular rhythm.      Pulses: Normal pulses.      Heart sounds: Normal heart sounds. No murmur heard.  Pulmonary:      Effort: Pulmonary effort is normal. No respiratory distress.      Breath sounds: Normal breath sounds. No wheezing or rales.   Abdominal:      General: Abdomen is flat. Bowel sounds are normal. There is no distension.      Palpations: Abdomen is soft.      Tenderness: There is no abdominal tenderness (no suprapubic tenderness).   Skin:     Capillary Refill: Capillary refill takes less than 2 seconds.   Neurological:      General: No focal deficit present.      Mental Status: He is alert and oriented to person, place, and time.          Laboratory Values:     Basic Metabolic Panel (BMP) Complete Blood Count (CBC)   Recent Labs  04/17/22  0543 04/16/22  1758   Sodium 137 136   Potassium 3.6 3.4   Chloride 101 97   CO2 22 23   UN 17 22*   Creatinine 1.11 1.32*   Glucose 114* 111*      Recent Labs   Lab 04/16/22  1758   WBC 13.5*   Hemoglobin 14.7   Hematocrit 43   Platelets 195   MCV 89   MCH 31   MCHC 35   RDW 13.4      Other Electrolytes    Recent Labs     04/17/22  0543 04/16/22  1758   Calcium 8.5*  9.2           Imaging:  US scrotum & contents with testicular doppler complete  Result Date: 04/17/2022  Arterial flow is seen within both testicles. Note, however, that this does not rule out intermittent testicular torsion. Hyperemic left testicle and epididymis which may be seen with epididymoorchitis. A complex left hydrocele is present and may represent pyocele in the context of infection. Somewhat increased vascular flow to the right epididymis, which may be reactive versus sequela of mild epididymitis. Diffuse edema and hyperemia of the overlying scrotum, compatible with cellulitis. Bilateral varicoceles.     CT pelvis with contrast  Result Date: 04/17/2022  Asymmetrically enlarged and hyperenhancing left testicle with swirling of the left spermatic cord and engorgement of spermatic vessels. Findings are concerning for testicular torsion. Another differential consideration is left epididymo-orchitis. Correlate with ultrasound Infiltration of the subcutaneous fat in the left inguinal region, compatible with cellulitis. No specific imaging findings to suggest necrotizing soft tissue infection.     US scrotum & contents with testicular doppler complete  Result Date: 04/16/2022  Enlarged heterogeneous left testicle and epididymis with increased vascularity consistent with epididymal orchitis. Complex hydrocele on the left. Scrotal skin thickening may represent cellulitis. Phases of torsion can have a similar appearance, recommend emergent urology consultation. Mild right varicocele.   Assessment & Plan:     Jorge Boyd is a 62 y.o. male with a PMH significant for afib (on diltiazem), kidney stones, pyelonephritis, and osteoarthritis s/p L hip arthroplasty who presented with increasing left testicular pain.     Ddx:  This presentation is mot consistent with an epididymitis that appears to have progressed to epididymitis-orchitis due to findings on imaging. In the setting of his known UTI that has not been able to  be managed due to high rates of resistance, it is likely that the source of this epididymal infection is in the urinary tract.  It is less likely STI-mediated given that this patient is monogamous and ha

## 2022-04-17 NOTE — Progress Notes (Signed)
Hospital Medicine Progress note    Subjective      Patient reports his testicular pain is close to 0/10 at rest but increases to 3-4/10 with motion or palpation. He notes the pain is located in both his left testicle and left frontal groin region. He has had no dysuria, frequency, fever, chills, chest pain, or shortness of breath.        Objective   Vitals:    04/16/22 1759 04/16/22 1925 04/17/22 0155 04/17/22 0536   BP: 160/90 (S) (!) 168/105 140/90 142/77   BP Location:   Right arm Right arm   Pulse: 80 91 102 96   Resp: 18 16  18    Temp: 37.4 C (99.3 F) 36.8 C (98.2 F) 37.7 C (99.8 F) 37.6 C (99.7 F)   TempSrc: Oral Temporal Oral Oral   SpO2: 97% 100% 93% 94%       Exam:   Gen- NAD, alert, appears comfortable laying in bed   HEENT- NCAT, MMM  CV- RRR, normal S1/S2, no murmurs appreciated  Resp- no increased WOB on RA, CTAB, no wheezes, coughing or crackles appreciated  GI- soft, non tender, non distended abdomen  GU- see attached media   Neuro- alert and oriented, moving all four extremities spontaneously and following command  Msk- No pain with active hip flexion, passive IR or ER   Extrem- no swelling in the bilateral LE    Labs:  BMP/Electrolytes CBC   Recent Labs     04/17/22  0543 04/16/22  1758   Sodium 137 136   Potassium 3.6 3.4   Chloride 101 97   CO2 22 23   UN 17 22*   Creatinine 1.11 1.32*   Glucose 114* 111*   Calcium 8.5* 9.2    Recent Labs     04/16/22  1758   WBC 13.5*   Hemoglobin 14.7   Hematocrit 43   Platelets 195   MCV 89   RDW 13.4   Seg Neut % 77.8   Lymphocyte % 10.3   Monocyte % 9.4   Eosinophil % 1.4   Basophil % 0.4      Liver Function Cardiac Studies   No results for input(s): "AST", "ALT", "HTBIL", "TB", "DB" in the last 72 hours.    No components found with this basename: "ALKPHOS" No results for input(s): "CKTS", "TROPU", "TROP", "MCKMB", "CKMB" in the last 168 hours.    No components found with this basename: "RICKMBS"      Relevant Imaging:     Testicular US  04/17/22:  Arterial flow is seen within both testicles. Note, however, that this does not rule out intermittent testicular torsion.Hyperemic left testicle and epididymis which may be seen with epididymoorchitis. A complex left hydrocele is present and may represent pyocele in the context of infection.  Somewhat increased vascular flow to the right epididymis, which may be reactive versus sequela of mild epididymitis. Diffuse edema and hyperemia of the overlying scrotum, compatible with cellulitis. Bilateral varicoceles.      CT Pelvis with contrast 04/17/22:  Asymmetrically enlarged and hyperenhancing left testicle with swirling of the left spermatic cord and engorgement of spermatic vessels. Findings are concerning for testicular torsion. Another differential consideration is left epididymo-orchitis.  Correlate with ultrasound.   Infiltration of the subcutaneous fat in the left inguinal region, compatible with cellulitis. No specific imaging findings to suggest necrotizing soft tissue infection.          Relevant Microbiology:    04/13/22:  Component 4 d ago   Aerobic Culture Escherichia coli Abnormal    Comment:  10,000/ml-50,000/ml  Isolate identified by MALDI-TOF  .   Resulting Agency [facility]     KB DISK MIC     Amikacin  Sensitive/Intermediate     Ampicillin  Resistant     Ampicillin/sulbactam  Resistant     Aztreonam  Resistant     Cefazolin - Urine  Resistant     Cefepime  Resistant     Ceftriaxone  Resistant     Ciprofloxacin  Resistant     Ertapenem  Sensitive     ESBL  +     Fosfomycin Sensitive      Gentamicin  Sensitive     Levofloxacin  Resistant     Meropenem  Sensitive     Nitrofurantoin  Sensitive     Piperacillin/Tazobactam  Sensitive     Tobramycin  Resistant     Trimethoprim/Sulfa  Resistant                              Assessment and Plan     Jorge Boyd is a 62 y.o. male with a PMH of recurrent E coli UTI and pyelonephritis, nephrolithiasis,  inguinal hernia repair x2 presenting for close to one week of left scrotal swelling and pain most concerning for epididymitis.     Sepsis 2/2 epididymororchitis- tachycardic with leukocytosis on arrival meeting SIRS criteria. Imaging concerning for pyocele of left testicle, no drainable collection noted on physical exam. Initially started on clindamycin, vancomycin, and ertapenem. Persistent torsion ruled out with ultrasound, however intermittent torsion still possible.   -Appreciate urology recs    -Will rack urine (collect and date/time each urine sample this admission)   -Continue broad spectrum antibiotics, ok to discontinue clindamycin given no concern for Fournier's   -Chlamydia/gonorrhea PCR pending   -Continue ertapenem (ESBL + on sensitivities) and vancomycin (given pyocele on imaging)  -Will obtain MRSA nares, could discontinue vancomycin 3/17 if clinical improvement on ertapenem and MRSA nares negative   -Scrotal sling, ice, tylenol, oxycodone for pain control       Chronic Medical Conditions-    HTN  Atrial fibrillation   -Resumed home diltiazem 120mg  daily   -Resumed home HCTZ 25mg  daily   -Patient previously on aspirin 81mg  daily, will clarify indication     BPH  -Resumed home tamsulosin 0.4mg  daily    Gout  -Resumed home allopurinol 300mg      Med Management    DVT Prophylaxis: Enoxaparin   Bowel Regimen: Miralax PRN  Pain Control: Tylenol   Activity Level: AAT  Maintenance    Lines/Tubes: PIV x1   F: PO  E: Daily BMP   N: PO Disposition    PT/OT: Not ordered  Barriers to d/c: Antibiotic regimen, pain improvement      Med rec: Completed     Code status: Full     Signed at 6:50 AM 04/17/2022  Conley Simmonds, DO  PM[facility] PGY-1  Pager [phone]

## 2022-04-17 NOTE — ED Notes (Signed)
Report Given To  Mimi, RN      Descriptive Sentence / Reason for Admission   Pt dx with uti on tues-started antibiotics-noticed swelling to left testicle worse today and pain      Active Issues / Relevant Events   Full code  A&Ox4  IV R AC 20g  Being tx with levaquin for epididymoorchitris then changed to bactrim now having fevers and feeling terrible.  IV abx infusing(discontinued)  LR running      To Do List  VS/A  Meds per mar  Pending [place] to R/o testicular torsion   Labs      Anticipatory Guidance / Discharge Planning  Admit for Epididymitis

## 2022-04-18 DIAGNOSIS — I480 Paroxysmal atrial fibrillation: Secondary | ICD-10-CM

## 2022-04-18 DIAGNOSIS — Z1612 Extended spectrum beta lactamase (ESBL) resistance: Secondary | ICD-10-CM

## 2022-04-18 DIAGNOSIS — N431 Infected hydrocele: Secondary | ICD-10-CM

## 2022-04-18 DIAGNOSIS — A499 Bacterial infection, unspecified: Secondary | ICD-10-CM

## 2022-04-18 DIAGNOSIS — A498 Other bacterial infections of unspecified site: Secondary | ICD-10-CM

## 2022-04-18 DIAGNOSIS — L03818 Cellulitis of other sites: Secondary | ICD-10-CM

## 2022-04-18 LAB — CBC AND DIFFERENTIAL
Baso # K/uL: 0.1 10*3/uL (ref 0.0–0.2)
Basophil %: 1 %
Eos # K/uL: 0.4 10*3/uL (ref 0.0–0.5)
Eosinophil %: 4.6 %
Hematocrit: 42 % (ref 37–52)
Hemoglobin: 14.6 g/dL (ref 12.0–17.0)
IMM Granulocytes #: 0.1 10*3/uL — ABNORMAL HIGH (ref 0.0–0.0)
IMM Granulocytes: 1.1 %
Lymph # K/uL: 1.5 10*3/uL (ref 1.0–5.0)
Lymphocyte %: 16.9 %
MCH: 31 pg (ref 26–32)
MCHC: 34 g/dL (ref 32–37)
MCV: 89 fL (ref 75–100)
Mono # K/uL: 1.1 10*3/uL — ABNORMAL HIGH (ref 0.1–1.0)
Monocyte %: 12.1 %
Neut # K/uL: 5.7 10*3/uL (ref 1.5–6.5)
Nucl RBC # K/uL: 0 10*3/uL (ref 0.0–0.0)
Nucl RBC %: 0 /100 WBC (ref 0.0–0.2)
Platelets: 238 10*3/uL (ref 150–450)
RBC: 4.8 MIL/uL (ref 4.0–6.0)
RDW: 13.3 % (ref 0.0–15.0)
Seg Neut %: 64.3 %
WBC: 8.9 10*3/uL (ref 3.5–11.0)

## 2022-04-18 LAB — BASIC METABOLIC PANEL
Anion Gap: 14 (ref 7–16)
CO2: 24 mmol/L (ref 20–28)
Calcium: 9.4 mg/dL (ref 8.6–10.2)
Chloride: 100 mmol/L (ref 96–108)
Creatinine: 1.07 mg/dL (ref 0.67–1.17)
Glucose: 117 mg/dL — ABNORMAL HIGH (ref 60–99)
Lab: 17 mg/dL (ref 6–20)
Potassium: 3.6 mmol/L (ref 3.3–5.1)
Sodium: 138 mmol/L (ref 133–145)
eGFR BY CREAT: 80 *

## 2022-04-18 LAB — CHLAMYDIA NAAT (PCR): Chlamydia NAAT (PCR): NEGATIVE

## 2022-04-18 LAB — VANCOMYCIN, TROUGH: Vancomycin Trough: 9.1 ug/mL — ABNORMAL LOW (ref 10.0–20.0)

## 2022-04-18 LAB — N. GONORRHOEAE NAAT (PCR): N. gonorrhoeae NAAT (PCR): NEGATIVE

## 2022-04-18 MED ORDER — VANCOMYCIN HCL IN NACL 1750 MG/550 ML IV SOLN *I*
1750.0000 mg | Freq: Two times a day (BID) | INTRAVENOUS | Status: DC
Start: 2022-04-18 — End: 2022-04-19
  Administered 2022-04-18 – 2022-04-19 (×2): 1750 mg via INTRAVENOUS
  Filled 2022-04-18: qty 550
  Filled 2022-04-18 (×2): qty 1750

## 2022-04-18 NOTE — Progress Notes (Signed)
Hospital Medicine Progress note    Subjective      Reports improvement in symptoms today.  Denies any fevers or chills.  Reports some ongoing pain with movement.  Reports a history of recurrent urinary tract infections.       Objective   Vitals:    04/17/22 1910 04/17/22 2123 04/18/22 0318 04/18/22 0927   BP: 123/74 117/77 149/85 (!) 138/91   BP Location: Right arm   Right arm   Pulse: 83 80 76    Resp: 18 15 15 16    Temp: 36.9 C (98.5 F) 36.4 C (97.5 F) 35.6 C (96.1 F) 36.4 C (97.6 F)   TempSrc: Oral   Temporal   SpO2: 96% 92% 96% 93%       Exam:   Gen- NAD, alert, appears comfortable laying in bed   HEENT- NCAT, MMM  Resp-no increased work of breathing, no conversational dyspnea  GI- soft, non tender, non distended abdomen  GU-enlarged scrotum, firm to touch, some tenderness  Neuro- alert and oriented, moving all four extremities spontaneously and following command  Extrem- no swelling in the bilateral LE    Labs:  BMP/Electrolytes CBC   Recent Labs     04/18/22  0122 04/17/22  0543 04/16/22  1758   Sodium 138 137 136   Potassium 3.6 3.6 3.4   Chloride 100 101 97   CO2 24 22 23    UN 17 17 22*   Creatinine 1.07 1.11 1.32*   Glucose 117* 114* 111*   Calcium 9.4 8.5* 9.2    Recent Labs     04/18/22  0122 04/16/22  1758   WBC 8.9 13.5*   Hemoglobin 14.6 14.7   Hematocrit 42 43   Platelets 238 195   MCV 89 89   RDW 13.3 13.4   Seg Neut % 64.3 77.8   Lymphocyte % 16.9 10.3   Monocyte % 12.1 9.4   Eosinophil % 4.6 1.4   Basophil % 1.0 0.4      Liver Function Cardiac Studies   No results for input(s): "AST", "ALT", "HTBIL", "TB", "DB" in the last 72 hours.    No components found with this basename: "ALKPHOS" No results for input(s): "CKTS", "TROPU", "TROP", "MCKMB", "CKMB" in the last 168 hours.    No components found with this basename: "RICKMBS"      Relevant Imaging:     Testicular US 04/17/22:  Arterial flow is seen within both testicles. Note, however, that this does not rule out intermittent testicular  torsion.Hyperemic left testicle and epididymis which may be seen with epididymoorchitis. A complex left hydrocele is present and may represent pyocele in the context of infection.  Somewhat increased vascular flow to the right epididymis, which may be reactive versus sequela of mild epididymitis. Diffuse edema and hyperemia of the overlying scrotum, compatible with cellulitis. Bilateral varicoceles.      CT Pelvis with contrast 04/17/22:  Asymmetrically enlarged and hyperenhancing left testicle with swirling of the left spermatic cord and engorgement of spermatic vessels. Findings are concerning for testicular torsion. Another differential consideration is left epididymo-orchitis.  Correlate with ultrasound.   Infiltration of the subcutaneous fat in the left inguinal region, compatible with cellulitis. No specific imaging findings to suggest necrotizing soft tissue infection.          Relevant Microbiology:    04/13/22:      Component 4 d ago   Aerobic Culture Escherichia coli Abnormal    Comment:  10,000/ml-50,000/ml  Isolate  identified by MALDI-TOF  .   Resulting Agency [facility]     KB DISK MIC     Amikacin  Sensitive/Intermediate     Ampicillin  Resistant     Ampicillin/sulbactam  Resistant     Aztreonam  Resistant     Cefazolin - Urine  Resistant     Cefepime  Resistant     Ceftriaxone  Resistant     Ciprofloxacin  Resistant     Ertapenem  Sensitive     ESBL  +     Fosfomycin Sensitive      Gentamicin  Sensitive     Levofloxacin  Resistant     Meropenem  Sensitive     Nitrofurantoin  Sensitive     Piperacillin/Tazobactam  Sensitive     Tobramycin  Resistant     Trimethoprim/Sulfa  Resistant                              Assessment and Plan     Jorge Boyd is a 62 y.o. male with a PMH of recurrent E coli UTI and pyelonephritis, ESBL, nephrolithiasis, inguinal hernia repair x2 presenting for one week of left scrotal swelling and pain most concerning for  epididymitis.  Some concern for torsion on presentation. This has since been ruled out.    Sepsis 2/2 epididymororchitis-     -Appreciate urology recs   -Continue Vanco and ertapenem for now, ID consult given ESBL, appreciate recommendations  -Chlamydia/gonorrhea PCR negative  -Continue ertapenem (ESBL + on sensitivities) and vancomycin (given pyocele on imaging)  -Per urology could repeat scrotal ultrasound in approximately 2 Kabe Mckoy no need to repeat any sooner unless he does not improve clinically      Chronic Medical Conditions-    HTN  Atrial fibrillation   -continue home diltiazem 120mg  daily   -continue  home HCTZ 25mg  daily   -Patient previously on aspirin 81mg  daily     BPH  -Resumed home tamsulosin 0.4mg  daily    Gout  -Resumed home allopurinol 300mg      Med Management    DVT Prophylaxis: Enoxaparin   Bowel Regimen: Miralax PRN  Pain Control: Tylenol   Activity Level: AAT  Maintenance    Lines/Tubes: PIV x1   F: PO  E: Daily BMP   N: PO Disposition    PT/OT: Not ordered  Barriers to d/c: Antibiotic regimen, pain improvement      Med rec: Completed     Code status: Full     Signed at 10:40 AM 04/18/2022  Derrell Lolling, MD

## 2022-04-18 NOTE — ED Notes (Signed)
Report Given To  RN      Descriptive Sentence / Reason for Admission   Pt dx with uti on tues-started antibiotics-noticed swelling to left testicle worse today and pain      Active Issues / Relevant Events   Full code  hx of ESBL E. Coli   Contact precaution recent UA ESBL MDRO  A&Ox4 RA  Ambulartory  Urinal  IV L AC 18g  IV Abx  Pain management  Regular diet  VS 4 hrs  No longer collecting 24 hr urine per nephrologist      To Do List  VS/A  Meds per mar  Pending [place] to R/o testicular torsion   Labs      Anticipatory Guidance / Discharge Planning  Admit for Epididymitis

## 2022-04-18 NOTE — ED Notes (Signed)
Report Given To  Mimi,RN      Descriptive Sentence / Reason for Admission   presenting for one week of left scrotal swelling and pain most concerning for epididymitis (torsion ruled out)    PMH: recurrent E coli UTI and pyelonephritis, ESBL, nephrolithiasis, inguinal hernia repair x2        Active Issues / Relevant Events   FULL CODE  Contact precaution recent UA ESBL MDRO  A/Ox4  Reg diet   RA  Independent  continent   No tele   L arm PIV           To Do List  VS Q4  Meds per [facility]   Labs as ordered       Anticipatory Guidance / Discharge Planning  Pending- clinical course

## 2022-04-18 NOTE — Bed Hold Note (Signed)
Bed: G16-17L  Expected date:   Expected time:   Means of arrival:   Comments:  H6

## 2022-04-18 NOTE — Progress Notes (Signed)
Vancomycin Maintenance Regimen - Pharmacist to Dose    Patient is receiving Vancomycin 1500 mg Q12H for Empiric Therapy. Today is day 2 of therapy.    Goal vancomycin trough is 10-15 mcg/mL.    Laboratory Data      Lab results: 04/18/22  0122 04/16/22  1758 02/17/22  1216   WBC 8.9 13.5* 5.2         Lab results: 04/18/22  0122 04/17/22  0543 04/16/22  1758   Creatinine 1.07 1.11 1.32*         Lab results: 04/18/22  0122 04/17/22  0543 04/16/22  1758   UN 17 17 22*     Vancomycin concentrations:       Lab results: 04/18/22  1521   Vancomycin Trough 9.1*       Assessment and Plan  Considering patient's renal function, volume status, clinical status, and reported concentrations, 1500 mg Q12H will obtain goal vancomycin concentration unless renal function or clinical status changes.   Next vancomycin concentration prior to 3rd dose, order has not been placed at this time.   Pharmacist will follow for changes in renal function, toxicity, and efficacy and order serum concentrations and creatinine as needed.    For questions contact pharmacy at extension [phone]    Nicholes Rough, PharmD

## 2022-04-18 NOTE — Progress Notes (Addendum)
Urology Progress Note  Attending: Raphael Gibney   LOS: 1 day  Full Code    AVSS  Overall feeling better with less malaise while on abx  Left scrotal pain and swelling improving   Pain controlled on tylenol ATC  Diet regular  Denies nausea, vomiting  Denies fevers, chills  Ambulating independently   Reports he has home urologist in [place], reports emptying bladder appropriately on flomax daily     Subjective  Scheduled Medications   senna  1 tablet Oral Nightly    acetaminophen  1,000 mg Oral TID    allopurinol  300 mg Oral Daily    ascorbic acid  1,000 mg Oral Daily    aspirin  81 mg Oral Daily    biotin  1 capsule Oral Daily    cholecalciferol (Vitamin D3)  1 capsule Oral Daily    co-enzyme Q-10  100 mg Oral Daily    multi-vitamin  1 tablet Oral Daily    polyethylene glycol  17 g Oral Daily    potassium citrate  10 mEq Oral Daily    tamsulosin  0.4 mg Oral Daily    dilTIAZem  120 mg Oral Daily    enoxaparin  40 mg Subcutaneous 2 times per day    ertapenem  1,000 mg Intravenous Q24H    vancomycin  1,500 mg Intravenous Q12H    hydroCHLOROthiazide  25 mg Oral Daily     PRN Medications   oxyCODONE  5 mg Oral Q6H PRN    Or    oxyCODONE  10 mg Oral Q6H PRN    sodium chloride  0-500 mL/hr Intravenous PRN    dextrose  0-500 mL/hr Intravenous PRN     Past Medical History:   Diagnosis Date    Atrial fibrillation     COVID-19 02/2019    Diverticulitis     Gout     Nephrolithiasis     Pyelonephritis 12/2018    ESBL E coli    Sepsis 12/2018    due to pyelonephritis     Shingles 2016     Past Surgical History:   Procedure Laterality Date    ANKLE FRACTURE SURGERY Left     COLONOSCOPY  11/06/2021    2 polys (tubular adenoma). severe diverticulosis. Dr Lennon Alstrom    FOREARM FRACTURE SURGERY Left     HIP REPLACEMENT Right 10/2019    in [facility]    INGUINAL HERNIA REPAIR Right 07/2017    INGUINAL HERNIA REPAIR Left 2012    LITHOTRIPSY  2021    x2    PR ARTHRP ACETBLR/PROX FEM PROSTC AGRFT/ALGRFT Left 09/09/2021    Procedure: LEFT ARTHROPLASTY,  HIP, TOTAL, ANTERIOR APPROACH;  Surgeon: Zella Ball, MD;  Location: [facility]IN [facility];  Service: Orthopedics    UMBILICAL HERNIA REPAIR  07/2018    incisional hernia repaired         OBJECTIVE:  Patient Vitals for the past 72 hrs:   BP Temp Temp src Pulse Resp SpO2   04/18/22 0318 149/85 35.6 ?C (96.1 ?F) -- 76 15 96 %   04/17/22 2123 117/77 36.4 ?C (97.5 ?F) -- 80 15 92 %   04/17/22 1910 123/74 36.9 ?C (98.5 ?F) Oral 83 18 96 %   04/17/22 1612 -- 37.3 ?C (99.1 ?F) TEMPORAL -- -- --   04/17/22 1235 124/70 36.4 ?C (97.5 ?F) TEMPORAL 89 18 96 %   04/17/22 0815 130/83 36.5 ?C (97.7 ?F) TEMPORAL 85 18 99 %   04/17/22 0536 142/77  37.6 ?C (99.7 ?F) Oral 96 18 94 %   04/17/22 0155 140/90 37.7 ?C (99.8 ?F) Oral 102 -- 93 %   04/16/22 1925 (!) 168/105 36.8 ?C (98.2 ?F) TEMPORAL 91 16 100 %   04/16/22 1759 160/90 37.4 ?C (99.3 ?F) Oral 80 18 97 %       GEN: NAD, pleasant  HEENT: NCAT  CHEST: Nonlabored respirations on RA  ABD: Soft, nontender, nondistended  GU: Erythema improved, mainly located around left scrotum alone. Swelling and induration surrounding left testicle mildly improved, no fluctuance or crepitus noted, tender to palpation over left hemiscrotum. Right testicle smooth, round, non-tender without nodularity or suspicious masses.   NEURO/MOTOR: Moving all extremities spontaneously  EXTREMITIES: Warm and well perfused without edema    One urine specimen at bedside is clear yellow    Heme:  Recent Labs   Lab 04/18/22  0122 04/16/22  1758   WBC 8.9 13.5*   Hemoglobin 14.6 14.7   Hematocrit 42 43   Platelets 238 195     Chem:  Recent Labs   Lab 04/18/22  0122 04/17/22  0543 04/16/22  1758   Sodium 138 137 136   Potassium 3.6 3.6 3.4   Chloride 100 101 97   CO2 24 22 23    UN 17 17 22*   Creatinine 1.07 1.11 1.32*   Glucose 117* 114* 111*   Calcium 9.4 8.5* 9.2   No results for input(s): "FLCR" in the last 168 hours.No results for input(s): "PGLU" in the last 168 hours.  Cultures:  Aerobic Culture   Date Value Ref  Range Status   04/16/2022 .  Final   04/13/2022 Escherichia coli (!)  Final     Comment:      10,000/ml-50,000/ml  Isolate identified by MALDI-TOF  .     02/17/2022 .  Final     Bacterial Blood Culture   Date Value Ref Range Status   04/17/2022 .  Preliminary   04/17/2022 .  Preliminary     Imaging:  US scrotum & contents with testicular doppler complete    Result Date: 04/17/2022  Arterial flow is seen within both testicles. Note, however, that this does not rule out intermittent testicular torsion. Hyperemic left testicle and epididymis which may be seen with epididymoorchitis. A complex left hydrocele is present and may represent pyocele in the context of infection. Somewhat increased vascular flow to the right epididymis, which may be reactive versus sequela of mild epididymitis. Diffuse edema and hyperemia of the overlying scrotum, compatible with cellulitis. Bilateral varicoceles. END OF IMPRESSION I have personally reviewed the images and the Resident's/Fellow's interpretation and agree with or edited the findings.       [facility] submits this DICOM format image data and final report to the [facility], an independent secure electronic health information exchange, on a reciprocally searchable basis (with patient authorization) for a minimum of 12 months after exam date.    CT pelvis with contrast    Result Date: 04/17/2022  Asymmetrically enlarged and hyperenhancing left testicle with swirling of the left spermatic cord and engorgement of spermatic vessels. Findings are concerning for testicular torsion. Another differential consideration is left epididymo-orchitis. Correlate with ultrasound Infiltration of the subcutaneous fat in the left inguinal region, compatible with cellulitis. No specific imaging findings to suggest necrotizing soft tissue infection. END IMPRESSION I have personally reviewed the images and the Resident's/Fellow's interpretation and agree with or edited the findings.       [facility]  submits this DICOM format image data and final report to the [facility], an independent secure electronic health information exchange, on a reciprocally searchable basis (with patient authorization) for a minimum of 12 months after exam date.    US scrotum & contents with testicular doppler complete    Result Date: 04/16/2022  Enlarged heterogeneous left testicle and epididymis with increased vascularity consistent with epididymal orchitis. Complex hydrocele on the left. Scrotal skin thickening may represent cellulitis. Phases of torsion can have a similar appearance, recommend emergent urology consultation. Mild right varicocele. END OF IMPRESSION       [facility] submits this DICOM format image data and final report to the [facility], an independent secure electronic health information exchange, on a reciprocally searchable basis (with patient authorization) for a minimum of 12 months after exam date.     Assessment and Plan  A/P: Jorge Boyd is a 62 y.o. male with a history of A. Fib, Diverticulitis, Hx ESBL E. Coli Pyelonephritis, Kidney stones s/p lithotripsy 2021, Inguinal hernia repair 2012 and 2019, who presents to [facility] ED today with left testicular pain and swelling that began 3/10. Pt was treated for epididymoorchitis by his PCP; urine cultures grew ESBL positive E. Coli sensitive. Pt was sent for a scrotal US that showed arterial flow to bilateral testicles, but had a concerning radiology read for possible torsion, so was referred to the ED. Signs and symptoms more likely epididymororchitis on ultrasound. History and physical exam not particularly concerning for torsion especially with good blood flow on ultrasound. However infection present in scrotum/groin, primarily cellulitis at this point. Fournier's is unlikely given no CT imaging findings at this time.     - No acute urologic interventions at this time  - Continue antibiotics    -Currently on vanc/erta, off clinda   -Recent ESBL UTI on  04/13/22  - Scrotal support, elevation  - OK to stop racking urines has hematuria resolved  - Continue flomax 0.4 mg qhs (home med)  - Can repeat scrotal US in 2 weeks for reassessment. Can reimage sooner if wo

## 2022-04-19 ENCOUNTER — Encounter: Payer: Self-pay | Admitting: Physician Assistant

## 2022-04-19 ENCOUNTER — Inpatient Hospital Stay: Payer: BLUE CROSS/BLUE SHIELD

## 2022-04-19 DIAGNOSIS — N451 Epididymitis: Secondary | ICD-10-CM

## 2022-04-19 DIAGNOSIS — Z1624 Resistance to multiple antibiotics: Secondary | ICD-10-CM

## 2022-04-19 DIAGNOSIS — Z96643 Presence of artificial hip joint, bilateral: Secondary | ICD-10-CM

## 2022-04-19 LAB — HEPATIC FUNCTION PANEL
ALT: 48 U/L (ref 0–50)
AST: 39 U/L (ref 0–50)
Albumin: 3.9 g/dL (ref 3.5–5.2)
Alk Phos: 114 U/L (ref 40–130)
Bilirubin,Direct: <0.2 mg/dL (ref 0.0–0.3)
Bilirubin,Total: 0.4 mg/dL (ref 0.0–1.2)
Total Protein: 6 g/dL — ABNORMAL LOW (ref 6.3–7.7)

## 2022-04-19 LAB — BASIC METABOLIC PANEL
Anion Gap: 15 (ref 7–16)
CO2: 23 mmol/L (ref 20–28)
Calcium: 9.2 mg/dL (ref 8.6–10.2)
Chloride: 104 mmol/L (ref 96–108)
Creatinine: 0.94 mg/dL (ref 0.67–1.17)
Glucose: 131 mg/dL — ABNORMAL HIGH (ref 60–99)
Lab: 19 mg/dL (ref 6–20)
Potassium: 3.7 mmol/L (ref 3.3–5.1)
Sodium: 142 mmol/L (ref 133–145)
eGFR BY CREAT: 93 *

## 2022-04-19 LAB — CBC
Hematocrit: 41 % (ref 37–52)
Hemoglobin: 14 g/dL (ref 12.0–17.0)
MCH: 31 pg (ref 26–32)
MCHC: 34 g/dL (ref 32–37)
MCV: 91 fL (ref 75–100)
Platelets: 255 10*3/uL (ref 150–450)
RBC: 4.5 MIL/uL (ref 4.0–6.0)
RDW: 13.4 % (ref 0.0–15.0)
WBC: 7.9 10*3/uL (ref 3.5–11.0)

## 2022-04-19 LAB — VENOUS BLOOD GAS
Base Excess,VENOUS: 3 mmol/L — ABNORMAL HIGH (ref <=2)
Bicarbonate,VENOUS: 27 mmol/L (ref 21–28)
CO2 (Calc),VENOUS: 28 mmol/L (ref 22–31)
CO: 0.8 %
FO2 HB,VENOUS: 67 % (ref 63–83)
Hemoglobin: 15.1 g/dL (ref 12.0–17.0)
Methemoglobin: 0.2 % (ref 0.0–1.0)
PCO2,VENOUS: 40 mm Hg (ref 40–50)
PH,VENOUS: 7.46 — ABNORMAL HIGH (ref 7.32–7.42)
PO2,VENOUS: 39 mm Hg (ref 25–43)

## 2022-04-19 MED ORDER — SODIUM CHLORIDE 0.9 % INJ (FLUSH) WRAPPED *I*
10.0000 mL | Status: DC | PRN
Start: 2022-04-19 — End: 2022-04-20

## 2022-04-19 MED ORDER — ERTAPENEM IN NS 1 GM *I*
1000.0000 mg | INTRAVENOUS | Status: DC
Start: 2022-04-20 — End: 2022-04-19

## 2022-04-19 MED ORDER — LIDOCAINE HCL 1% IJ SOLN *WRAPPED* (FOR LINE PLACEMENT ONLY)
1.0000 mL | Freq: Once | INTRAMUSCULAR | Status: AC | PRN
Start: 2022-04-19 — End: 2022-04-19
  Administered 2022-04-19: 1 mL via INTRADERMAL

## 2022-04-19 MED ORDER — OXYCODONE HCL 5 MG PO TABS *I*
5.0000 mg | ORAL_TABLET | Freq: Four times a day (QID) | ORAL | Status: AC | PRN
Start: 2022-04-19 — End: 2022-04-20

## 2022-04-19 MED ORDER — IBUPROFEN 600 MG PO TABS *I*
600.0000 mg | ORAL_TABLET | Freq: Four times a day (QID) | ORAL | Status: DC | PRN
Start: 2022-04-19 — End: 2022-04-20
  Administered 2022-04-19: 600 mg via ORAL
  Filled 2022-04-19: qty 1

## 2022-04-19 MED ORDER — SODIUM CHLORIDE 0.9 % INJ (FLUSH) WRAPPED *I*
10.0000 mL | Freq: Three times a day (TID) | Status: DC | PRN
Start: 2022-04-19 — End: 2022-04-20

## 2022-04-19 MED ORDER — ERTAPENEM IN NS 1 GM *I*
1000.0000 mg | INTRAVENOUS | Status: DC
Start: 2022-04-20 — End: 2022-04-20
  Administered 2022-04-20: 1000 mg via INTRAVENOUS
  Filled 2022-04-19: qty 1

## 2022-04-19 NOTE — Student Note (Signed)
Hospital Medicine Progress Note    Admission Date: 04/16/2022  Current Date: 04/19/2022  Length of Stay: 2    CODE STATUS: Full Code    Chief Complaint:   Chief Complaint   Patient presents with    Testicle Pain & Swelling       Interval Events:     - No acute events overnight    Subjective:        This morning, Jorge Boyd is very comfortable. He notes that he is feeling "great," aside from his testicle pain and swelling. Jorge Boyd says he has spoken with urology and understands that it will likely take weeks to months before his swelling is completely gone, which he states is upsetting but manageable. His goal for today is for ID to tell him that he can get a PICC Line and go home on IV antibiotics.     Review of Systems   Constitutional:  Negative for chills and fever.   HENT:  Negative for congestion.    Eyes:  Negative for blurred vision.   Respiratory:  Negative for cough, hemoptysis, sputum production, shortness of breath and wheezing.    Cardiovascular:  Negative for chest pain and palpitations.   Gastrointestinal:  Negative for abdominal pain, constipation, diarrhea, heartburn, nausea and vomiting.   Genitourinary:  Negative for dysuria and urgency.        Scrotal swelling and discomfort   - "Macintosh apple-sized" testicle     Neurological:  Negative for dizziness, sensory change and headaches (gets a daily pain in his neck at night-time that he describes as a headache).   Psychiatric/Behavioral:  The patient is not nervous/anxious.         Has a robust sense of humor and excellent coping strategies.          Current Medications:     Scheduled:   vancomycin  1,750 mg Intravenous Q12H    senna  1 tablet Oral Nightly    acetaminophen  1,000 mg Oral TID    allopurinol  300 mg Oral Daily    ascorbic acid  1,000 mg Oral Daily    aspirin  81 mg Oral Daily    biotin  1 capsule Oral Daily    cholecalciferol (Vitamin D3)  1 capsule Oral Daily    co-enzyme Q-10  100 mg Oral Daily    multi-vitamin  1 tablet Oral Daily     polyethylene glycol  17 g Oral Daily    potassium citrate  10 mEq Oral Daily    tamsulosin  0.4 mg Oral Daily    dilTIAZem  120 mg Oral Daily    enoxaparin  40 mg Subcutaneous 2 times per day    ertapenem  1,000 mg Intravenous Q24H    hydroCHLOROthiazide  25 mg Oral Daily     Continuous:   Vancomycin - Pharmacist to Dose       PRN:   oxyCODONE  5 mg Oral Q6H PRN    ibuprofen  600 mg Oral Q6H PRN    sodium chloride  0-500 mL/hr Intravenous PRN    dextrose  0-500 mL/hr Intravenous PRN     Physical Examination:     Vital Signs:  Temp:  [35.2 ?C (95.4 ?F)-36.7 ?C (98.1 ?F)] 36.4 ?C (97.6 ?F)  Heart Rate:  [79-86] 79  Resp:  [15-18] 16  BP: (113-138)/(63-91) 132/89    Intake/Output Summary (Last 24 hours) at 04/19/2022 0913  Last data filed at 04/19/2022 0703  Gross per 24 hour  Intake 480 ml   Output --   Net 480 ml       Physical Exam:   Physical Exam  Constitutional:       Appearance: Normal appearance.      Comments: Resting flat in bed comfortably     HENT:      Head: Normocephalic and atraumatic.      Mouth/Throat:      Mouth: Mucous membranes are moist.      Pharynx: No oropharyngeal exudate or posterior oropharyngeal erythema.   Eyes:      General: No scleral icterus.     Conjunctiva/sclera: Conjunctivae normal.   Cardiovascular:      Rate and Rhythm: Normal rate and regular rhythm.      Pulses: Normal pulses.      Heart sounds: Normal heart sounds. No murmur heard.  Pulmonary:      Effort: Pulmonary effort is normal. No respiratory distress.      Breath sounds: No wheezing or rales.   Abdominal:      General: Abdomen is flat. Bowel sounds are normal. There is no distension.      Palpations: Abdomen is soft.      Tenderness: There is no abdominal tenderness.   Musculoskeletal:      Right lower leg: No edema.      Left lower leg: No edema.   Skin:     General: Skin is warm.      Capillary Refill: Capillary refill takes less than 2 seconds.   Neurological:      General: No focal deficit present.      Mental Status:  He is alert and oriented to person, place, and time.   Psychiatric:         Mood and Affect: Mood normal.          Laboratory Values:     Basic Metabolic Panel (BMP) Complete Blood Count (CBC)   Recent Labs     04/19/22  0143 04/18/22  0122 04/17/22  0543   Sodium 142 138 137   Potassium 3.7 3.6 3.6   Chloride 104 100 101   CO2 23 24 22    UN 19 17 17    Creatinine 0.94 1.07 1.11   Glucose 131* 117* 114*      Recent Labs   Lab 04/19/22  0143 04/18/22  0122 04/16/22  1758   WBC 7.9 8.9 13.5*   Hemoglobin 14.0 14.6 14.7   Hematocrit 41 42 43   Platelets 255 238 195   MCV 91 89 89   MCH 31 31 31    MCHC 34 34 35   RDW 13.4 13.3 13.4      Other Electrolytes    Recent Labs     04/19/22  0143 04/18/22  0122 04/17/22  0543   Calcium 9.2 9.4 8.5*                         Other Labs:  Urine Culture grew 10,000-50,000 ESBL e.coli   Susceptibility     Escherichia coli     KB DISK MIC     Amikacin  Sensitive/Intermediate     Ampicillin  Resistant     Ampicillin/sulbactam  Resistant     Aztreonam  Resistant     Cefazolin - Urine  Resistant     Cefepime  Resistant     Ceftriaxone  Resistant     Ciprofloxacin  Resistant     Ertapenem  Sensitive  ESBL  +     Fosfomycin Sensitive      Gentamicin  Sensitive     Levofloxacin  Resistant     Meropenem  Sensitive     Nitrofurantoin  Sensitive     Piperacillin/Tazobactam  Sensitive     Tobramycin  Resistant     Trimethoprim/Sulfa  Resistant                Bcx: NGTD      MRSA Nares Negative   Gonorrhea/Chlamydia Negative    Imaging: No New Imaging in 24 h   US scrotum & contents with testicular doppler complete  Result Date: 04/17/2022  Arterial flow is seen within both testicles. Note, however, that this does not rule out intermittent testicular torsion. Hyperemic left testicle and epididymis which may be seen with epididymoorchitis. A complex left hydrocele is present and may represent pyocele in the context of infection. Somewhat increased vascular flow to the right epididymis, which may  be reactive versus sequela of mild epididymitis. Diffuse edema and hyperemia of the overlying scrotum, compatible with cellulitis. Bilateral varicoceles.     CT pelvis with contrast  Result Date: 04/17/2022  Asymmetrically enlarged and hyperenhancing left testicle with swirling of the left spermatic cord and engorgement of spermatic vessels. Findings are concerning for testicular torsion. Another differential consideration is left epididymo-orchitis. Correlate with ultrasound Infiltration of the subcutaneous fat in the left inguinal region, compatible with cellulitis. No specific imaging findings to suggest necrotizing soft tissue infection.   Assessment & Plan:     Jorge Boyd is a 62 y.o. male with a PMH significant for atrial fibrillation (on diltiazem), kidney stones, pyelonephritis, and recurrent asymptomatic UTIs presenting with imaging findings consistent with epididymo-orchitis in the setting of known ESBL, multi-drug resistant UTI without meeting SIRS criteria.     Ddx:  This presentation is mot consistent with an epididymitis that appears to have progressed to epididymitis-orchitis due to findings on imaging. In the setting of his known UTI that has not been able to be managed due to high rates of resistance, it is likely that the source of this epididymal infection is in the urinary tract.  Testicular Abscess is a possible complication of this infection if it were to continue to go untreated. Imaging is concerning for pyocele, and urology has signed off with  follow-up outpatient in ~ 2 weeks.   It is less likely STI-mediated given that this patient is monogamous and has a clear source of infection.  STI testing has been reassuring against this as well.   Testicular Torsion is a concern whenever someone comes with acute testicular swelling and pain, though ultrasound is very reassuring against this, and his pain has subsided.       04/19/2022 (Day 3): Jorge Boyd is doing quite well. His WBCs have normalized  and he has no infectious symptoms. Overall, he is in good spirits and is waiting to go home. Infectious Disease input regarding vancomycin discontinuation and the feasibility of outpatient management with a

## 2022-04-19 NOTE — ED Notes (Addendum)
Report Given To  RN      Descriptive Sentence / Reason for Admission   presenting for one week of left scrotal swelling and pain most concerning for epididymitis (torsion ruled out)    PMH: recurrent E coli UTI and pyelonephritis, ESBL, nephrolithiasis, inguinal hernia repair x2        Active Issues / Relevant Events   FULL CODE  Contact precaution recent UA ESBL MDRO  A/Ox4  Reg diet   RA  Independent  continent   No tele   L arm PIV IV ABX   VS TID while awake  Plan of care:  Antibiotic regimen and pain improvement         To Do List  VS TID While awake  Meds per [facility]   Labs as ordered       Anticipatory Guidance / Discharge Planning  Pending- clinical course

## 2022-04-19 NOTE — Plan of Care (Signed)
[facility] - Infectious Diseases Plan of Care and Sign Off Note   It is recommended that the discharging provider use the "home IV antibiotics" orderset     Referring Service/Physician:Hospital medicine    Primary ID Diagnosis/Diagnoses: Urinary tract infection  Summary: This is a 62 y.o. male with PMH of Afib, nephrolithiasis, osteoarthritis, pyelonephritis who presented with left testicular pain and swelling. Presentation concerning for epididymitis with recent urine culture with MDR ESBL+ E coli obtained in outpatient clinic.      Upon presentation to the ED he was afebrile with leukocytosis of 13.4. He underwent an ultrasound of the scrotum and testicles that showed Enlarged heterogeneous left testicle and epididymis with increased vascularity consistent with epididymal orchitis. Due to ultrasound findings her was informed to present to ED for additional work-up.  Blood cultures were obtained along with repeat a urinalysis and culture.  Screen for gonorrhea and chlamydia which was negative.  He was empirically started on ertapenem and IV vancomycin and was given clindamycin x 1 day.     Urology was consulted and recommended repeating ultrasound in 2 weeks and no acute urological intervention at this time.  He was to continue Flomax and encourage scrotal elevation.     He was seen by PCP on 3/12 with left testicular pain and swelling, endorsed fever of 101 with no improvement after NSIADs, Tylenols, and scroal elevation.  He was started on a 5 day course of levofloxacin and ultrasound was ordered.  Urinalysis showed 11-20 WBCs no bacteria +2 squamous cells and cultures grew 10-50,000 ESBL positive E. coli. He was prescribed bactrim which he took for one day.     He is now medically ready for discharge with plan to continue ertapenem to complete a 14-day course and follow-up in ID clinic.    Microbiology and culture source:            Date Source Result   3/12 Aerobic urine culture 10-50000 ESBL E. coli   3/15 Aerobic  urine culture No growth   3/16 Peripheral blood cultures No growth to date   3/16 MRSA nares Negative   3/16 Chlamydia and gonorrhea Negative                  Discharge Antibiotics and Dose: Ertapenem 1 g every 24 hours  Antibiotic Start Date: 04/17/2022  Anticipated Antibiotic Stop Date: 05/01/2022    * Monitoring with safety blood work every other week is recommended for patients receiving low-risk IV antibiotics:     cefazolin, ceftriaxone, continuous infusion penicillin or ampicillin or ampicillin/sulbactam, ertapenem, cefepime.    * IV antibiotics other than low-risk listed above require weekly blood work.    Labs to be drawn after discharge, with frequency (preferably sent to a [facility] Lab, if unable to use [facility] lab, please have results faxed to [phone]):    Bi-weekly: CBC with diff, CMP    Outpatient ID Fellow/ ID [id number] Rossie Muskrat, NP    Fax/route labs to Outpatient ID attending and PCP: [name], MD    Follow-up ID Appointment: 3/25 @ 3:00pm with Allie Bossier NP     If this appointment needs to be rescheduled, medical providers may call ID Clinic at [phone].     This plan is as of 04/19/2022, 8:45 AM.  Any changes to this plan should be reflected in discharge summary, and discharge AVS, and the outpatient ID Attending and Fellow should be notified as well.    Betti Cruz, NP  Route notes to:  [facility] pool  Outpatient ID Attending

## 2022-04-19 NOTE — Continuity of Care (Addendum)
Acute Care Coordinator: Discharge Planning    Hospital Problem:  (summary from H&P)Jorge Boyd is a 62 y.o. male with a PMH of recurrent E coli UTI and pyelonephritis, ESBL, nephrolithiasis, inguinal hernia repair x2 presenting for one week of left scrotal swelling and pain most concerning for epididymitis. Initially concern     Chart reviewed and Clinical research associate met with patient to discuss discharge planning.     Acute Care Coordinator contact via face-to-face PPE used: mask  Was patient masked during visit: No.    Address/Phone/PCP: verified as on facesheet; lives in home with spouse   Supports/ADL's: Spouse   Medications: Spouse Jorge Boyd will learn home IV ATB and has performed in past  Equipment at Home: none  New DME needed:  none                                       Home Care Agency:Patient receptive to home care referral writer sent to [facility]  via secure chat declined referral- Writer referred to [facility] pending acceptance.     Home Infusion pharmacy referral sent to [facility]  via secure chat pending review.      Plan:  Current Discharge Plan: Home to complete IV ATB with home care  Estimated Discharge Date per Interdisciplinary Rounds:     Appointments:  PCP/ follow up appointments scheduled per AVS (IF scheduled)    [facility] will continue to follow for discharge needs.     Please call with questions/concerns related to discharge planning.    HOME CARE AGENCY CHOICE    Medicare regulations require that hospitals advise patients that they may choose to receive services from any home health agency.  Patient/family choice of agency is noted with an [x]  by that agency's name.      Agencies: [x]   [facility] [place], [place], [place], [place], [place] counties) declined       [x]   [facility] [place]       []   [facility]      []   [facility] [place]        []   [facility])        []   Other ____________________      Home Care Agency Representative alerted:      Date:  04/19/22    Time: 314pm     Jorge Boyd   Acute Care Coordinator  [id number]  [phone]  Jorge Boyd [phone]        *[facility] along with [facility], [facility], [facility], [facility], and [facility] are all part of the [place] [facility] [place]'s health care system.

## 2022-04-19 NOTE — Progress Notes (Signed)
Hospital Medicine Progress note    Subjective      Jorge Boyd. Patient reports his testicular pain is currently a 4/10. He has been able to walk around the unit with some discomfort but overall feels improved from when he arrived to the hospital. He is curious about the process of getting a PICC line.        Objective   Vitals:    04/18/22 1801 04/18/22 2221 04/19/22 0144 04/19/22 0429   BP: 129/82 120/70 127/63 113/66   BP Location: Left arm  Left arm Right arm   Pulse:  86  79   Resp: 16 15 18 18    Temp: 36.4 C (97.6 F) 35.2 C (95.4 F) 36.3 C (97.3 F) 36.7 C (98.1 F)   TempSrc: Temporal Skin Temporal Tympanic   SpO2: 95% 92% 94% 96%       Exam:   Gen- NAD, alert, initially seen ambulating in unit hallway  HEENT- NCAT, MMM  Resp-normal work of breathing on room air, no crackles or wheezing   GI- soft, non tender, non distended abdomen  Neuro- alert and oriented, moving all four extremities spontaneously and following command, no gait instability   Extrem- no swelling in the bilateral LE    Labs:  BMP/Electrolytes CBC   Recent Labs     04/19/22  0143 04/18/22  0122 04/17/22  0543   Sodium 142 138 137   Potassium 3.7 3.6 3.6   Chloride 104 100 101   CO2 23 24 22    UN 19 17 17    Creatinine 0.94 1.07 1.11   Glucose 131* 117* 114*   Calcium 9.2 9.4 8.5*    Recent Labs     04/19/22  0143 04/18/22  0122 04/16/22  1758   WBC 7.9 8.9 13.5*   Hemoglobin 14.0 14.6 14.7   Hematocrit 41 42 43   Platelets 255 238 195   MCV 91 89 89   RDW 13.4 13.3 13.4   Seg Neut %  --  64.3 77.8   Lymphocyte %  --  16.9 10.3   Monocyte %  --  12.1 9.4   Eosinophil %  --  4.6 1.4   Basophil %  --  1.0 0.4      Liver Function Cardiac Studies   No results for input(s): "AST", "ALT", "HTBIL", "TB", "DB" in the last 72 hours.    No components found with this basename: "ALKPHOS" No results for input(s): "CKTS", "TROPU", "TROP", "MCKMB", "CKMB" in the last 168 hours.    No components found with this basename: "RICKMBS"      Relevant Imaging:      Testicular US 04/17/22:  Arterial flow is seen within both testicles. Note, however, that this does not rule out intermittent testicular torsion.Hyperemic left testicle and epididymis which may be seen with epididymoorchitis. A complex left hydrocele is present and may represent pyocele in the context of infection.  Somewhat increased vascular flow to the right epididymis, which may be reactive versus sequela of mild epididymitis. Diffuse edema and hyperemia of the overlying scrotum, compatible with cellulitis. Bilateral varicoceles.      CT Pelvis with contrast 04/17/22:  Asymmetrically enlarged and hyperenhancing left testicle with swirling of the left spermatic cord and engorgement of spermatic vessels. Findings are concerning for testicular torsion. Another differential consideration is left epididymo-orchitis.  Correlate with ultrasound.   Infiltration of the subcutaneous fat in the left inguinal region, compatible with cellulitis. No specific imaging findings to suggest necrotizing  soft tissue infection.      Relevant Microbiology:    Chlamydia/gonorrhea PCR: negative    MRSA nares: negative     04/13/22:      Component 4 d ago   Aerobic Culture Escherichia coli Abnormal    Comment:  10,000/ml-50,000/ml  Isolate identified by MALDI-TOF  .   Resulting Agency [facility] coli     KB DISK MIC     Amikacin  Sensitive/Intermediate     Ampicillin  Resistant     Ampicillin/sulbactam  Resistant     Aztreonam  Resistant     Cefazolin - Urine  Resistant     Cefepime  Resistant     Ceftriaxone  Resistant     Ciprofloxacin  Resistant     Ertapenem  Sensitive     ESBL  +     Fosfomycin Sensitive      Gentamicin  Sensitive     Levofloxacin  Resistant     Meropenem  Sensitive     Nitrofurantoin  Sensitive     Piperacillin/Tazobactam  Sensitive     Tobramycin  Resistant     Trimethoprim/Sulfa  Resistant                              Assessment and Plan     Cyan Drakos is a 62 y.o.  male with a PMH of recurrent E coli UTI and pyelonephritis, ESBL, nephrolithiasis, inguinal hernia repair x2 presenting for one week of left scrotal swelling and pain most concerning for epididymitis. Initially concern     Sepsis 2/2 epididymororchitis  - Appreciate urology recs    -No acute intervention needed at this time   -Repeat scrotal ultrasound in 2 weeks or sooner if clinically worsens   - ID consult   -Plan for PICC placement and continuing ertapenem for 14 day total course    -Ok to discontinue vancomycin  -Continue tamsulosin 0.4mg  daily   -Scrotal elevation, icing PRN for pain       Chronic Medical Conditions-    HTN  Atrial fibrillation   -continue home diltiazem 120mg  daily   -continue  home HCTZ 25mg  daily   -Restarted aspirin 81mg  daily     BPH  -Resumed home tamsulosin 0.4mg  daily    Gout  -Resumed home allopurinol 300mg      Med Management    DVT Prophylaxis: Enoxaparin   Bowel Regimen: Miralax PRN  Pain Control: Tylenol   Activity Level: AAT  Maintenance    Lines/Tubes: PIV x1   F: PO  E: Daily BMP   N: PO Disposition    PT/OT: Not ordered  Barriers to d/c: PICC placement     Med rec: Completed     Code status: Full     Signed at 6:25 AM 04/19/2022  Conley Simmonds, DO

## 2022-04-19 NOTE — ED Notes (Signed)
Report Given To  Mimi,RN      Descriptive Sentence / Reason for Admission   presenting for one week of left scrotal swelling and pain most concerning for epididymitis (torsion ruled out)    PMH: recurrent E coli UTI and pyelonephritis, ESBL, nephrolithiasis, inguinal hernia repair x2        Active Issues / Relevant Events   FULL CODE  Contact precaution recent UA ESBL MDRO  A/Ox4  Reg diet   RA  Independent  continent   No tele   L PICC(single lumen) IV ABX   VS TID while awake  Plan of care:  Antibiotic regimen and pain improvement         To Do List  VS Q4  Meds per [facility]   Labs as ordered       Anticipatory Guidance / Discharge Planning  S/P L arm PICC placement single lumen, possible d/c 3/19 with home care services

## 2022-04-19 NOTE — Procedures (Signed)
PICC ASSESSMENT/PROCEDURE NOTE    Pre-Procedure Assessment/Information-    Robyne Askew, RN received Handoff Report from Ernestene Kiel RN for PICC procedure       Patient Vascular History/Contraindications: No Known Problems  Nephrology note required for PICC?: N/A  Labs Reviewed:      Lab results: 04/19/22  0143   eGFR BY CREAT 93     No results found for: "CREATININE"      Lab results: 04/19/22  0143   WBC 7.9           Lab results: 08/31/21  0930   INR 1.0           Lab results: 04/19/22  0143   Platelets 255       Extremity Assessment:  WNL  Indications:  Long Term Abx  Education Provided to Patient /Family: yes    Procedure Information-  Time out documentation completed prior to procedure: Yes  Lot #: [id number]                                        Size:4 French Single Lumen PICC  Side:left (history of left sided PICC placements)    Procedure Date :04/19/2022  Ultrasound used:yes  Modified Seldinger technique used.  Catheter exchange: no  Vein Accessed: basilic  Venipuncture attempts & sites: 1   Guide Wire advancement: easy  Catheter Advancement: easy  Switched arms: no  Procedure: successful  Guide Wire Removed : yes    Placement Details:   Total length of trimmed line: 55cm  External catheter length: 3cm  PICC Securement: Adhesive  Attention (if subcutaneous securement device used): This patient's catheter is secured with SecurAcath instead of a traditional suture or statlock. This is a subcutaneous engineered stabilization device that holds the catheter just below the skin with nitinol feet and a friction fit grip around the catheter.  Do not open, change or remove the device.   SecurAcath may be disinfected during a dressing change, but do not open or remove it.   SecurAcath acts like a hinge - lift, clean 360 degrees around, let dry and apply new dressing.   SecurAcath is compatible with all dressings.   A Statlock is not necessary when SecurAcath is present.  Patients can go to MRI with SecurAcath  device in place.   24 hour SecurAcath Support [phone]    For further details please see Doc Flowsheets IV assessment.    Complications: None    Condition:    Comfort Measures Provided : Pain Medication as Ordered by Provider     Placement Verification:  PICC tip verified by ECG Tip Confirmation System: Yes  ECG rhythm strip placed in chart and uploaded to patient MEDICAL RECORD NUMBERYes  VBG WNL: Yes  STAT Portable Chest X-Ray Ordered :N/A    EBL: Minimal    Disposition: Inpatient Unit    Handoff Info:    Patient Patient tolerated procedure well   Specimen Collection: yes  Procedure Dressing Site located:Left Arm  Dressing Type:occlusive  Biopatch:Yes  Dressing status:Clean, dry and intact   Hematoma:Not evident  Medication received:Lidocaine 1%  Implant patient information given to patient or parent/guardian:Yes  Report given to:Unit Nurse. Unit G16 Name Ernestene Kiel RN .  Floor Nurse is aware that OK to Use PICC order must be placed prior to use of PICC.  PICC inserted by Gwenith Daily RN AVAST  Scribe:   Robyne Askew, RN  04/19/2022  3:54 PM

## 2022-04-19 NOTE — Progress Notes (Signed)
[facility] unable to accept referral at this time. [facility] Sue Kobel notified via in basket.           Shirl Ludington, RN [facility]  [facility]  [phone]  Weekends/Holidays [phone]  After 4pm [phone] Option 4

## 2022-04-19 NOTE — Progress Notes (Signed)
04/19/22 1217   [facility] Patient Class Review   Patient Class Review Inpatient     Patient Class effective 04/17/22.    Tressia Danas BS, RN-BC   Utilization Management   [place] [facility]  Office Ph. [phone]

## 2022-04-19 NOTE — Provider Consult (Addendum)
Infectious Diseases Consultation Note                               Reason for Consultation: Outpatient antibiotic recommendations for discharge in the setting of ESBL E. coli found on urine cultures  Requesting Team: Hospital medicine  Admission Date: 04/16/2022  Consultation Date: 04/19/2022    HPI: This is a 62 y.o. male with PMH of Afib, nephrolithiasis, osteoarthritis, pyelonephritis who presented with left testicular pain and swelling. Presentation concerning for epididymitis with recent urine culture with MDR ESBL+ E coli obtained in outpatient clinic.     Upon presentation to the ED he was afebrile with leukocytosis of 13.4. He underwent an ultrasound of the scrotum and testicles that showed Enlarged heterogeneous left testicle and epididymis with increased vascularity consistent with epididymal orchitis. Due to ultrasound findings her was informed to present to ED for additional work-up.  Blood cultures were obtained along with repeat a urinalysis and culture.  Screen for gonorrhea and chlamydia which was negative.  He was empirically started on ertapenem and IV vancomycin and was given clindamycin x 1 day.    Urology was consulted and recommended repeating ultrasound in 2 weeks and no acute urological intervention at this time.  He was to continue Flomax and encourage scrotal elevation.    He was seen by PCP on 3/12 with left testicular pain and swelling, endorsed fever of 101 with no improvement after NSIADs, Tylenols, and scroal elevation.  He was started on a 5 day course of levofloxacin and ultrasound was ordered.  Urinalysis showed 11-20 WBCs no bacteria +2 squamous cells and cultures grew 10-50,000 ESBL positive E. coli.    Mr.Statzer was seen today at bedside he reported acute left-sided testicular pain and swelling and feelings of fevers prior to seeing his PCP on 3/12.  He denies history of testicular swelling however reports prior urinary tract infections.  He states that previously he was  followed by infectious disease team in [place] and had received outpatient antibiotics via PICC line.  He does not recall having symptoms of urinary tract infection prior to presentation of PCP.  He took a 5-day course of levofloxacin and finished 1 day course of Bactrim prior to presentation to [facility] ED.  On exam he reports that fevers have now resolved along with pain since initiation of IV antibiotic therapy.  He has some ongoing tenderness along testicles however he believes this is secondary to serial exams from providers.  He denies antibiotic allergies.  He lives at home with his wife who recently underwent hysterectomy.  He reports that his wife is able to help with IV antibiotic administration.     Pertinent Lab Data: Leukocytosis resolved with white blood count 7.9 from 13.5.  Creatinine 0.94 from 1.32 on admission.      Infectious Diseases now consulting for outpatient antibiotic recommendations for discharge in the setting of ESBL E. coli found on urine cultures      Environmental Exposure History  Travel: Lives in [place] with wife  Domestic Travel: Lives in [place] part-time  Hardware: Left forearm hardware and bilateral hip replacement      Subjective   Review of Systems   Constitutional:  Negative for chills, diaphoresis, fever, malaise/fatigue and weight loss.   HENT:  Negative for congestion, sinus pain and sore throat.    Eyes:  Negative for photophobia.   Respiratory:  Negative for cough and shortness of breath.  Cardiovascular:  Negative for chest pain and leg swelling.   Gastrointestinal:  Negative for abdominal pain.   Genitourinary:  Negative for dysuria.   Skin:  Negative for itching and rash.          Reviewed in detail and All other systems negative       SHx:  Social History     Socioeconomic History    Marital status: Married   Tobacco Use    Smoking status: Never    Smokeless tobacco: Never   Substance and Sexual Activity    Alcohol use: Not Currently    Drug use: Never     Sexual activity: Yes     Partners: Female     Comment: monogamous   Social History Narrative    Splits time between [place] and [place].    Lives with wife, Sofie Rower.     3 kids and 2 granddaughters.    Business Conservation officer, historic buildings.           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 severe diverticulosis. Dr Lennon Alstrom    FOREARM FRACTURE SURGERY Left     HIP REPLACEMENT Right 10/2019    in [place] [facility]    INGUINAL HERNIA REPAIR Right 07/2017    INGUINAL HERNIA REPAIR Left 2012    LITHOTRIPSY  2021    x2    PR ARTHRP ACETBLR/PROX FEM PROSTC AGRFT/ALGRFT Left 09/09/2021    Procedure: LEFT ARTHROPLASTY, HIP, TOTAL, ANTERIOR APPROACH;  Surgeon: Zella Ball, MD;  Location: [facility] MAIN OR;  Service: Orthopedics    UMBILICAL HERNIA REPAIR  07/2018    incisional hernia repaired       Scheduled Meds:   vancomycin  1,750 mg Intravenous Q12H    senna  1 tablet Oral Nightly    acetaminophen  1,000 mg Oral TID    allopurinol  300 mg Oral Daily    ascorbic acid  1,000 mg Oral Daily    aspirin  81 mg Oral Daily    biotin  1 capsule Oral Daily    cholecalciferol (Vitamin D3)  1 capsule Oral Daily     co-enzyme Q-10  100 mg Oral Daily    multi-vitamin  1 tablet Oral Daily    polyethylene glycol  17 g Oral Daily    potassium citrate  10 mEq Oral Daily    tamsulosin  0.4 mg Oral Daily    dilTIAZem  120 mg Oral Daily    enoxaparin  40 mg Subcutaneous 2 times per day    ertapenem  1,000 mg Intravenous Q24H    hydroCHLOROthiazide  25 mg Oral Daily     Continuous Infusions:   Vancomycin - Pharmacist to Dose       PRN Meds:.oxyCODONE **OR** oxyCODONE, sodium chloride, dextrose    ALL:  Allergies   Allergen Reactions    Demerol Hcl [Meperidine] Nausea And Vomiting         Family History   Adopted: Yes   Problem Relation Age of Onset    Anesthesia problems Neg Hx        Vitals:    04/19/22 0833   BP: 132/89   Pulse:    Resp: 16   Temp: 36.4 ?C (97.6 ?F)       =Code Status - Full Code     Physical Exam:  Vitals:  Temp:  [35.2 ?C (95.4 ?F)-36.7 ?C (98.1 ?F)] 36.4 ?C (97.6 ?F)  Heart Rate:  [79-86] 79  Resp:  [15-18] 16  BP: (113-138)/(63-91) 132/89  General:  Alert, cooperative, no distress, appears stated age  Head:  Normocephalic, without obvious abnormality, atraumatic  Eyes: PERRL, conjunctiva/corneas clear  ENT:  Nares normal, septum midline, mucosa normal  Chest: Clear to auscultation bilaterally, respirations unlabored, on room air  Heart: Regular rate and rhythm, S1, S2 normal  Abdomen: Soft, non-tender, bowel sounds normoactive  GU: left sided testicular swelling + tenderness, slight erythema, no drainage   Musculoskeletal:  Joints without deformity, effusions, erythema, tenderness  Pulses:  2+ and symmetrical  Extremities: no cyanosis or edema  Skin: no rashes or lesions  Neuro: grossly normal    Objective:  Vital signs: (most recent): Blood pressure 132/89, pulse 79, temperature 36.4 ?C (97.6 ?F), temperature source Temporal, resp. rate 16, SpO2 93%.  No fever.          Recent Labs   Lab 04/19/22  0143 04/18/22  0122 04/16/22  1758   WBC 7.9 8.9 13.5*   Hemoglobin 14.0 14.6 14.7   Hematocrit 41 42 43   Platelets  255 238 195       Recent Labs   Lab 04/19/22  0143 04/18/22  0122 04/17/22  0543   Sodium 142 138 137   Potassium 3.7 3.6 3.6   Chloride 104 100 101   CO2 23 24 22    UN 19 17 17    Creatinine 0.94 1.07 1.11   [place]

## 2022-04-20 ENCOUNTER — Telehealth: Payer: Self-pay

## 2022-04-20 ENCOUNTER — Other Ambulatory Visit: Payer: Self-pay

## 2022-04-20 ENCOUNTER — Telehealth: Payer: Self-pay | Admitting: Student in an Organized Health Care Education/Training Program

## 2022-04-20 LAB — CBC
Hematocrit: 42 % (ref 37–52)
Hemoglobin: 13.9 g/dL (ref 12.0–17.0)
MCH: 31 pg (ref 26–32)
MCHC: 33 g/dL (ref 32–37)
MCV: 93 fL (ref 75–100)
Platelets: 252 10*3/uL (ref 150–450)
RBC: 4.5 MIL/uL (ref 4.0–6.0)
RDW: 13.3 % (ref 0.0–15.0)
WBC: 9.5 10*3/uL (ref 3.5–11.0)

## 2022-04-20 MED ORDER — SODIUM CHLORIDE 0.9 % INJ (FLUSH) WRAPPED *I*
10.0000 mL | 5 refills | Status: DC | PRN
Start: 2022-04-20 — End: 2022-05-20

## 2022-04-20 MED ORDER — HEPARIN LOCK FLUSH 10 UNIT/ML IJ SOLN WRAPPED *I*
5.0000 mL | INTRAVENOUS | 5 refills | Status: DC | PRN
Start: 2022-04-20 — End: 2022-05-20

## 2022-04-20 MED ORDER — ERTAPENEM SODIUM 1 GM INJ SOLR (100 MG/ML) *WRAPPED*
1000.0000 mg | 0 refills | Status: AC
Start: 2022-04-20 — End: 2022-05-01

## 2022-04-20 NOTE — Discharge Summary (Addendum)
Name: Jorge Boyd MRN: [id number] DOB: Dec 08, 1960     Admit Date: 04/16/2022   Date of Discharge: 04/20/2022     Patient was accepted for discharge to home.         Discharge Attending Physician: Konrad Saha, MD      Hospitalization Summary    CONCISE NARRATIVE: 62 y.o. male with a PMH of recurrent E coli UTI and pyelonephritis, ESBL, nephrolithiasis, inguinal hernia repair x2 presenting for one week of left scrotal swelling and pain with imaging most concerning for orchiepididymitis.  Some concern for torsion on presentation which was ruled out. Evaluated by ID who recommended ertapenem 1g q24h to complete 14 day course. He had hematuria at the beginning of his hospitalization that resolved, urology recommends OP follow-up.    To do:  -Follow CMP, BMP, LFTs while on ertapenem  -OP gross hematuria work-up with nephrology  -Repeat scrotal US in 2 weeks          CT RESULTS: CT pelvis with contrast:  Asymmetrically enlarged and hyperenhancing left testicle with swirling of the left spermatic cord and engorgement of spermatic vessels. Findings are concerning for testicular torsion. Another differential consideration is left epididymo-orchitis.  Correlate with ultrasound  Infiltration of the subcutaneous fat in the left inguinal region, compatible with cellulitis. No specific imaging findings to suggest necrotizing soft tissue infection.   ULTRASOUND RESULTS: Ultrasound scrotum:  Arterial flow is seen within both testicles. Note, however, that this does not rule out intermittent testicular torsion.  Hyperemic left testicle and epididymis which may be seen with epididymoorchitis. A complex left hydrocele is present and may represent pyocele in the context of infection.  Somewhat increased vascular flow to the right epididymis, which may be reactive versus sequela of mild epididymitis.  Diffuse edema and hyperemia of the overlying scrotum, compatible with cellulitis.  Bilateral varicoceles.      PENDING MICRO  RESULTS: Blood cultures (NGTD)   SIGNIFICANT MED CHANGES: Yes  Ertapenem via PICC, end date 3/30   CONSULTANT SERVICE     Urology               Signed: Silver Huguenin, MD  On: 04/20/2022  at: 2:18 PM

## 2022-04-20 NOTE — Progress Notes (Signed)
Met with patient and spouse at bedside for home infusion teaching of IV ertapenem every 24 hours via SL PICC by gravity minibag plus.  Instructed  [name] regarding medication and supply storage, handwashing, setting up a clean work space, flushing CVC line, administering medication, and reportable issues/concerns with good teach back. [name] gave return demonstration using teaching supplies and is able and willing to do this therapy at home.  Provided with written instructions and [facility] welcome packet with 24/7 phone number.      Verified home address and phone numbers.      SL PICC is patent with brisk blood return and dressing is clean, dry and intact.     Harriett S[facility] aware of the above.     Thank you,    Gillian Scarce, RN   Home Infusion Care Coordinator  [facility]  C: [phone] (M-F 8-4:30)  O: [phone] after hours  F: [phone]

## 2022-04-20 NOTE — Progress Notes (Signed)
Hospital Medicine Progress note    Subjective      Jorge Boyd. Patient reports his testicular pain was more severe yesterday but so fr today has not been giving him trouble. He notes in the past he has had a PICC line placed for antibiotics while he lived in [place] where he went to a medical office daily for antibiotic administration.        Objective   Vitals:    04/19/22 0429 04/19/22 0833 04/19/22 1646 04/19/22 2017   BP: 113/66 132/89 141/77 137/75   BP Location: Right arm Left arm Right arm    Pulse: 79   79   Resp: 18 16 16 15    Temp: 36.7 C (98.1 F) 36.4 C (97.6 F) 36.4 C (97.6 F) 35.7 C (96.3 F)   TempSrc: Tympanic Temporal Temporal Skin   SpO2: 96% 93% 93% 95%       Exam:   Gen- NAD, alert, comfortably resting in bed   HEENT- NCAT, MMM  Resp-normal work of breathing on room air, no crackles or wheezing   GI- soft, non tender, non distended abdomen  Neuro- alert and oriented, moving all four extremities spontaneously and following command  Extrem- no swelling in the bilateral LE    Labs:  BMP/Electrolytes CBC   Recent Labs     04/19/22  0143 04/18/22  0122   Sodium 142 138   Potassium 3.7 3.6   Chloride 104 100   CO2 23 24   UN 19 17   Creatinine 0.94 1.07   Glucose 131* 117*   Calcium 9.2 9.4   Albumin 3.9  --     Recent Labs     04/20/22  0410 04/19/22  1602 04/19/22  0143 04/18/22  0122   WBC 9.5  --  7.9 8.9   Hemoglobin 13.9 15.1 14.0 14.6   Hematocrit 42  --  41 42   Platelets 252  --  255 238   MCV 93  --  91 89   RDW 13.3  --  13.4 13.3   Seg Neut %  --   --   --  64.3   Lymphocyte %  --   --   --  16.9   Monocyte %  --   --   --  12.1   Eosinophil %  --   --   --  4.6   Basophil %  --   --   --  1.0      Liver Function Cardiac Studies   Recent Labs     04/19/22  0143   AST 39   ALT 48   Bilirubin,Total 0.4   Bilirubin,Direct <0.2     No components found with this basename: "ALKPHOS"   No results for input(s): "CKTS", "TROPU", "TROP", "MCKMB", "CKMB" in the last 168 hours.    No components  found with this basename: "RICKMBS"      Relevant Imaging:     Testicular US 04/17/22:  Arterial flow is seen within both testicles. Note, however, that this does not rule out intermittent testicular torsion.Hyperemic left testicle and epididymis which may be seen with epididymoorchitis. A complex left hydrocele is present and may represent pyocele in the context of infection.  Somewhat increased vascular flow to the right epididymis, which may be reactive versus sequela of mild epididymitis. Diffuse edema and hyperemia of the overlying scrotum, compatible with cellulitis. Bilateral varicoceles.      CT Pelvis with contrast 04/17/22:  Asymmetrically  enlarged and hyperenhancing left testicle with swirling of the left spermatic cord and engorgement of spermatic vessels. Findings are concerning for testicular torsion. Another differential consideration is left epididymo-orchitis.  Correlate with ultrasound.   Infiltration of the subcutaneous fat in the left inguinal region, compatible with cellulitis. No specific imaging findings to suggest necrotizing soft tissue infection.      Relevant Microbiology:    Chlamydia/gonorrhea PCR: negative    MRSA nares: negative     04/13/22:      Component 4 d ago   Aerobic Culture Escherichia coli Abnormal    Comment:  10,000/ml-50,000/ml  Isolate identified by MALDI-TOF  .   Resulting Agency [facility] coli     KB DISK MIC     Amikacin  Sensitive/Intermediate     Ampicillin  Resistant     Ampicillin/sulbactam  Resistant     Aztreonam  Resistant     Cefazolin - Urine  Resistant     Cefepime  Resistant     Ceftriaxone  Resistant     Ciprofloxacin  Resistant     Ertapenem  Sensitive     ESBL  +     Fosfomycin Sensitive      Gentamicin  Sensitive     Levofloxacin  Resistant     Meropenem  Sensitive     Nitrofurantoin  Sensitive     Piperacillin/Tazobactam  Sensitive     Tobramycin  Resistant     Trimethoprim/Sulfa  Resistant                               Assessment and Plan     Jorge Boyd is a 62 y.o. male with a PMH of recurrent E coli UTI and pyelonephritis, ESBL, nephrolithiasis, inguinal hernia repair x2 presenting for one week of left scrotal swelling and pain most concerning for epididymitis. Initially concerning for testicular torsion however this was ruled out by imaging. Plan for discharge on outpatient IV antibiotics for 2 week total course.     Sepsis 2/2 epididymororchitis  - Appreciate urology recs    -No acute intervention needed at this time   -Repeat scrotal ultrasound in 2 weeks or sooner if clinically worsens   - ID consult   -PICC placed, plan for 14 day total course of ertapenem, end date 3/30, teaching today    -Ok to discontinue vancomycin 3/18  -Continue tamsulosin 0.4mg  daily   -Scrotal elevation, icing PRN for pain       Chronic Medical Conditions-    HTN  Atrial fibrillation   -continue home diltiazem 120mg  daily   -continue  home HCTZ 25mg  daily   -Restarted aspirin 81mg  daily     BPH  -Resumed home tamsulosin 0.4mg  daily    Gout  -Resumed home allopurinol 300mg      Med Management    DVT Prophylaxis: Enoxaparin   Bowel Regimen: Miralax PRN  Pain Control: Tylenol   Activity Level: AAT  Maintenance    Lines/Tubes: PIV x1   F: PO  E: Daily BMP   N: PO Disposition    PT/OT: Not ordered  Barriers to d/c: OPAT teaching      Med rec: Completed     Code status: Full     Signed at 6:28 AM 04/20/2022  Conley Simmonds, DO

## 2022-04-20 NOTE — Interdisciplinary Rounds (Signed)
Interdisciplinary Discharge Communication Note    Medically Ready for Discharge Date: 04/20/22   Targeted Discharge Disposition : Home with services    Rounding was performed on: IDR Date: 04/20/22 at: IDR Time: 1000    Interdisciplinary Rounding Participants: SW, Care Coordinator, Charge RN    Admit Date/Time:  04/16/2022  4:56 PM     Principal Problem:  <principal problem not specified>Epididymitis  The patient's problem list and interdisciplinary care plan was reviewed.    Expected Date for Discharge: 04/20/2022        Discharge Planning    Targeted Disposition   Targeted Discharge Disposition : Home with services    Home Care  Home Care, Choiced?: Yes   Accepting Home Care Agency: [facility] (accepted but due to co=pays, going with vendor only)       Medical Equipment Needs         Medical Supplies Available: Yes    IV antibiotic administration instruction completed at bedside by H.Dewolf RN from [facility]. Delivery of supplies is to pt's home later today.                                     Delivery of D/C DME/medical supplies confirmed, if applicable: Complete (IV supplies delivery to home)         Communication                Current PCP:  [name], MD    Scheduled Future Appointments:        Future Appointments      Monday April 26, 2022 3:00 PM  FOLLOW UP VISIT with Maryjean Ka, NP  [facility] (--) [phone]              Transportation  Transportation Setup: Complete (spouse)       SW Financial/Voucher Assistance for Transportation: N/A    Prescriptions   Prescriptions sent to outpatient pharmacy: N/A         Druscilla Brownie, RN  2:57 PM

## 2022-04-20 NOTE — ED Notes (Signed)
Report Given To  RN      Descriptive Sentence / Reason for Admission   presenting for one week of left scrotal swelling and pain most concerning for epididymitis (torsion ruled out)    PMH: recurrent E coli UTI and pyelonephritis, ESBL, nephrolithiasis, inguinal hernia repair x2        Active Issues / Relevant Events   FULL CODE  Contact precaution recent UA ESBL MDRO  A/Ox4  Reg diet   RA  Independent  continent   No tele   L PICC(single lumen) IV ABX   VS TID while awake  Plan of care:  Antibiotic regimen and pain improvement         To Do List  VS Q4  Meds per [facility]   Labs as ordered       Anticipatory Guidance / Discharge Planning  S/P L arm PICC placement single lumen, possible d/c 3/19 with home care services

## 2022-04-20 NOTE — Telephone Encounter (Signed)
Georgetta Haber, MD  [facility]        Seen in ED for L epid-orchitis requiring IV abx. Pt also sees Urologist in [place] but needs one here    Please schedule new pt visit in about 3 weeks with any provider for reexamination    Thanks  Morrie Sheldon

## 2022-04-20 NOTE — Continuity of Care (Addendum)
Discharge planning: [facility] RN Gillian Scarce will do an instructional session with the pt and his wife at 2pm today on IV antibiotic administration. Home Infusion will also provide IV care and lab draws.     Lowell Guitar, RN, BSN  Acute Care Coordinator [facility]  Cell:[phone]

## 2022-04-20 NOTE — Student Note (Signed)
Hospital Medicine Progress Note    Admission Date: 04/16/2022  Current Date: 04/20/2022  Length of Stay: 3    CODE STATUS: Full Code    Chief Complaint:   Chief Complaint   Patient presents with    Testicle Pain & Swelling       Interval Events:     - No acute events overnight    Subjective:        This morning, Jorge Boyd is feeling much better. He says that he is not having as much pain as he had in the past, and is very hopeful that he can go home today. Today is his two-year anniversary with his wife, and he would love to be able to be discharged. He is receptive to [facility] teaching and in-home care.     Review of Systems   Constitutional:  Negative for chills and fever.   HENT:  Negative for congestion.    Eyes:  Negative for blurred vision.   Respiratory:  Negative for cough, hemoptysis, sputum production and shortness of breath.    Cardiovascular:  Negative for chest pain and palpitations.   Gastrointestinal:  Negative for abdominal pain, constipation, diarrhea, heartburn, nausea and vomiting.   Genitourinary:  Negative for dysuria, frequency and urgency.        Has had no difficulties with urination   Musculoskeletal:  Negative for myalgias.   Neurological:  Negative for headaches.       Current Medications:     Scheduled:   ertapenem  1,000 mg Intravenous Q24H    senna  1 tablet Oral Nightly    acetaminophen  1,000 mg Oral TID    allopurinol  300 mg Oral Daily    ascorbic acid  1,000 mg Oral Daily    aspirin  81 mg Oral Daily    biotin  1 capsule Oral Daily    cholecalciferol (Vitamin D3)  1 capsule Oral Daily    co-enzyme Q-10  100 mg Oral Daily    multi-vitamin  1 tablet Oral Daily    polyethylene glycol  17 g Oral Daily    potassium citrate  10 mEq Oral Daily    tamsulosin  0.4 mg Oral Daily    dilTIAZem  120 mg Oral Daily    enoxaparin  40 mg Subcutaneous 2 times per day    hydroCHLOROthiazide  25 mg Oral Daily     PRN:   oxyCODONE  5 mg Oral Q6H PRN    ibuprofen  600 mg Oral Q6H PRN    sodium chloride  10 mL  Intracatheter Q8H PRN    sodium chloride  10 mL Intracatheter PRN    sodium chloride  0-500 mL/hr Intravenous PRN    dextrose  0-500 mL/hr Intravenous PRN     Physical Examination:     Vital Signs:  Temp:  [35.7 ?C (96.3 ?F)-36.4 ?C (97.6 ?F)] 35.7 ?C (96.3 ?F)  Heart Rate:  [79] 79  Resp:  [15-16] 15  BP: (137-141)/(75-77) 137/75    Intake/Output Summary (Last 24 hours) at 04/20/2022 0903  Last data filed at 04/20/2022 0716  Gross per 24 hour   Intake 480 ml   Output --   Net 480 ml       Physical Exam:   Physical Exam  Constitutional:       Appearance: Normal appearance.   HENT:      Head: Normocephalic and atraumatic.      Mouth/Throat:      Mouth: Mucous membranes  are moist.   Eyes:      General: No scleral icterus.     Conjunctiva/sclera: Conjunctivae normal.   Cardiovascular:      Rate and Rhythm: Normal rate and regular rhythm.      Pulses: Normal pulses.      Heart sounds: No murmur heard.  Pulmonary:      Effort: Pulmonary effort is normal. No respiratory distress.      Breath sounds: Normal breath sounds. No wheezing or rales.   Abdominal:      General: Abdomen is flat. Bowel sounds are normal. There is no distension.      Palpations: Abdomen is soft.      Tenderness: There is no abdominal tenderness (no suprapubic tenderness).   Musculoskeletal:      Right lower leg: No edema.      Left lower leg: No edema.   Neurological:      General: No focal deficit present.      Mental Status: He is alert and oriented to person, place, and time.          Laboratory Values:     Basic Metabolic Panel (BMP) Complete Blood Count (CBC)   Recent Labs     04/19/22  0143 04/18/22  0122   Sodium 142 138   Potassium 3.7 3.6   Chloride 104 100   CO2 23 24   UN 19 17   Creatinine 0.94 1.07   Glucose 131* 117*      Recent Labs   Lab 04/20/22  0410 04/19/22  1602 04/19/22  0143 04/18/22  0122   WBC 9.5  --  7.9 8.9   Hemoglobin 13.9 15.1 14.0 14.6   Hematocrit 42  --  41 42   Platelets 252  --  255 238   MCV 93  --  91 89   MCH 31   --  31 31   MCHC 33  --  34 34   RDW 13.3  --  13.4 13.3      Other Electrolytes    Recent Labs     04/19/22  0143 04/18/22  0122   Calcium 9.2 9.4                         Other Labs:  Urine Culture grew 10,000-50,000 ESBL e.coli   Susceptibility     Escherichia coli     KB DISK MIC     Amikacin  Sensitive/Intermediate     Ampicillin  Resistant     Ampicillin/sulbactam  Resistant     Aztreonam  Resistant     Cefazolin - Urine  Resistant     Cefepime  Resistant     Ceftriaxone  Resistant     Ciprofloxacin  Resistant     Ertapenem  Sensitive     ESBL  +     Fosfomycin Sensitive      Gentamicin  Sensitive     Levofloxacin  Resistant     Meropenem  Sensitive     Nitrofurantoin  Sensitive     Piperacillin/Tazobactam  Sensitive     Tobramycin  Resistant     Trimethoprim/Sulfa  Resistant                Bcx: NGTD      MRSA Nares Negative   Gonorrhea/Chlamydia Negative    Imaging: No New Imaging in 24 h   US scrotum & contents with testicular doppler complete  Result  Date: 04/17/2022  Arterial flow is seen within both testicles. Note, however, that this does not rule out intermittent testicular torsion. Hyperemic left testicle and epididymis which may be seen with epididymoorchitis. A complex left hydrocele is present and may represent pyocele in the context of infection. Somewhat increased vascular flow to the right epididymis, which may be reactive versus sequela of mild epididymitis. Diffuse edema and hyperemia of the overlying scrotum, compatible with cellulitis. Bilateral varicoceles.     CT pelvis with contrast  Result Date: 04/17/2022  Asymmetrically enlarged and hyperenhancing left testicle with swirling of the left spermatic cord and engorgement of spermatic vessels. Findings are concerning for testicular torsion. Another differential consideration is left epididymo-orchitis. Correlate with ultrasound Infiltration of the subcutaneous fat in the left inguinal region, compatible with cellulitis. No specific imaging  findings to suggest necrotizing soft tissue infection.   Assessment & Plan:     Favian Hickam is a 62 y.o. male with a PMH significant for atrial fibrillation (on diltiazem), kidney stones, pyelonephritis, and recurrent asymptomatic UTIs presenting with imaging findings consistent with epididymo-orchitis in the setting of known ESBL, multi-drug resistant UTI without meeting SIRS criteria.     Ddx:  This presentation is mot consistent with an epididymitis that appears to have progressed to epididymitis-orchitis due to findings on imaging. In the setting of his known UTI that has not been able to be managed due to high rates of resistance, it is likely that the source of this epididymal infection is in the urinary tract.  Testicular Abscess is a possible complication of this infection if it were to continue to go untreated. Imaging is concerning for pyocele, and urology has signed off with  follow-up outpatient in ~ 2 weeks.   It is less likely STI-mediated given that this patient is monogamous and has a clear source of infection.  STI testing has been reassuring against this as well.   Testicular Torsion is a concern whenever someone comes with acute testicular swelling and pain, though ultrasound is very reassuring against this, and his pain has subsided.       04/20/2022 (Day 4): Jorge Boyd continues to do well, and has had his PICC line placed. He is eager to go home. His barriers to discharge at this time are PICC line teaching and acceptance by home care.     Plan:  1) Epididymo-orchitis concerning for pyocele, likely secondary to UTI given recurrent UTIs  - UA on 3/12 grew ESBL e. Coli resistant to ciprofloxacin, transitioned to Bactrim   - inpatient, given ertapenem, clindamycin, and vancomycin                  - sensitive to ertapenem and nitrofurantoin, though unlikely that nitrofurantoin will be sufficient for his infection given his history of repeat complicated UTIs   - ID recommends 14 days total of ertapenem  through PICC line (to end 05/01/22)  - Urology has seen patient, reassuring against torsion                  - recommend ice and racking urine    - signed off, will follow up in 2 weeks outpatient   - F/U

## 2022-04-20 NOTE — ED Notes (Signed)
04/20/22 1549   Departure Condition   Departure Condition Stable   Mobility at [facility]   Patient Teaching Discharge instructions reviewed;Follow-up care reviewed;Medications discussed;Patient verbalized understanding;Caregiver verbalized understanding   Belongings/Valuables Returned to Patient   Accompanied by Alone   Discharge Transportation Car   Appropriate clothing In place   Access to residence/caregiver Yes

## 2022-04-20 NOTE — Telephone Encounter (Signed)
Scheduled patient NPV  with Melinda on 05/26/2022 @ 8:00 , called patient left voice mail to inform     Druscilla Brownie, RN  P [facility] Referral Coordinators  Discharge today on IV antibiotic thru 3/30. Needs appt to follow up please.

## 2022-04-20 NOTE — Progress Notes (Signed)
Home Infusion Service Therapy Start    Patient Jorge Boyd is beginning Ertapenem therapy with the [facility] service as of their pending discharge. Please contact our RN liaisons Heather DeWolf and/or Lou Miner once the discharge has been confirmed. Delivery of the supplies will be discussed with the patient once the discharge has been planned and will be delivered either to the bedside or to the home depending on patient preference.      Any copay or out of pocket costs have already been discussed with the patient by the Home Infusion Intake team.    For Drug and Supply related questions, dose changes or new IV drug orders please contact the pharmacy at [phone], 8:00 AM to 4:30 PM Monday-Friday or [phone] after hours.     For Insurance qualification questions please contact the Intake department at [phone] or via the in-basket HOME INFUSION INTAKE pool. The intake team is available Mon-Fri from 8:00 AM - 4:30 PM without any after hours support.     Thank you,    Leonie Man, PharmD

## 2022-04-21 ENCOUNTER — Other Ambulatory Visit: Payer: Self-pay

## 2022-04-21 ENCOUNTER — Other Ambulatory Visit: Payer: BLUE CROSS/BLUE SHIELD

## 2022-04-21 NOTE — Telephone Encounter (Signed)
Reviewed. I do not have any personal urology recommendations. Urology would like him seen in 3 weeks. Continue follow up with ID

## 2022-04-21 NOTE — Progress Notes (Signed)
In System Transitions Care Management Documentation:        Hospital Admission Date/Discharge Date: 04/16/2022 To 04/20/2022     Discharge Diagnosis at the time of discharge:    1. Epididymitis         Hospital Area Discharged From: [facility])     Discharge Disposition:  Home or Self Care    Pending Labs at Discharge:     PENDING MICRO RESULTS: Blood cultures (NGTD)      Course of Hospital Stay(copied from hospital summary): CONCISE NARRATIVE: 62 y.o. male with a PMH of recurrent E coli UTI and pyelonephritis, ESBL, nephrolithiasis, inguinal hernia repair x2 presenting for one week of left scrotal swelling and pain with imaging most concerning for orchiepididymitis.  Some concern for torsion on presentation which was ruled out. Evaluated by ID who recommended ertapenem 1g q24h to complete 14 day course. He had hematuria at the beginning of his hospitalization that resolved, urology recommends OP follow-up.    To do:  -Follow CMP, BMP, LFTs while on ertapenem  -OP gross hematuria work-up with nephrology  -Repeat scrotal US in 2 weeks    Medications: New, Changed, or Stopped:     Medication List        START taking these medications      ertapenem  1,000 mg by Intermittent IV Infusion route every 24 hours for 11 days  for Complicated Urinary Tract Infection     heparin lock flush 10 UNIT/ML injection  5 mLs (50 units total) by Intracatheter route as needed for Line Care  Flush intravenous catheter lumen with 5 mL as directed.     sodium chloride 0.9 % solution  10 mLs by Intracatheter route as needed               Where to Get Your Medications        These medications will be mailed to you       From: [facility] [place], [place] - [address] Phone: [phone]   ertapenem  heparin lock flush 10 UNIT/ML injection  sodium chloride 0.9 % solution         Future Appointments          Apr 26 2022   03:00 PM - FOLLOW UP VISIT  [facility] - Maryjean Ka, NP           May 26 2022   08:00 AM - NEW PATIENT VISIT  [facility] - Demetrios Loll, NP               Upcoming Appointments and To Do's:    Your To Do List       [name], MD Follow up.    Specialty: Family Medicine  Contact information  [address]  [address]   [place] [address]  [phone]               [facility].    Specialty: Home Health Services  Why: your vendor for IV supplies, IV care and lab draws  Contact information  [address]  [place] [address]  [phone]                              Orma Render, RN  04/21/2022   TCM POST DISCHARGE CONTACT 3 OUTREACH  Care Management Intervention: Care Manager Intervention Completed       Risk of Admission or ED Visit  Current as of 39 minutes ago        6% 40 to 100%: High Risk   20 to < 40%: Medium Risk   0 to < 20%: Low Risk     No Change          This score indicates an adult patient's 1-year risk, as a percentage, of a hospital admission or ED visit.             Current PCP:  [name], MD                  Spoke To:: patient      Are you feeling as good as you did when you left the hospital?: Yes (doing very well at home, handling his IV antibioitcs with his wife , that is going very well. home care stopping today. swelling of left testes is same but pain is gone. feels very good overall)         Has home care agency contacted patient yet?: Yes (will see him today)      If equipment was ordered at time of discharge, does the patient have this in the home?: Yes (has all equipment needed for his iv antibiotics)       Is the patient aware of pending orders / testing? : Yes      Do you have any questions about your discharge instructions?: No      Do you have an appointment scheduled with your PCP and know the date and time of that appointment?: No (has ID follow up next week, urology not until april 24th , curious that is so far away.)      Do you have transportation to get to that  appointment?: Yes      Do you have all of your medications listed on your discharge summary and are you taking them as they are written on the bottle?: Yes         Discharge medications reviewed and reconciled against outpatient MEDICAL RECORD NUMBER: No      Do you have any questions or concerns about your medications?: No         If the patient is newly initiated on anticoagulation, are they aware who is managing this?: N/A      Reviewed medications with:: patient      Time spent on the call with the patient:: 15 minutes

## 2022-04-21 NOTE — Nursing Note (Signed)
Home infusion visit - SOC.   Weekly PICC dressing change, and every other week lab draw. Patient reports feeling well today.   Patient is independent with his ertapenem 1g IV q24hr infusions and line flushing - patient already did his morning dose today, pt able to verbally explain the process correctly, pt states he has done this process in the past as well so feels comfortable with it.   All SOC paperwork reviewed with patient - nursing and pharmacy phone numbers, service care agreement, emergency planning, fall prevention and home safety, infection/sepsis education, health care proxy, IV medication administration and daily line care. All patient questions answered.  Patient denies pain to PICC arm, reports it flushes easily. Dried blood noted under dressing, patient did not have time for a dressing change today as he had meetings to go to, patient agreeable to nurse coming on Friday to do a dressing change. Next home visit: 3/22

## 2022-04-21 NOTE — Progress Notes (Signed)
Called Pt, informed of message below from [facility]

## 2022-04-21 NOTE — Continuity of Care (Signed)
In System Transitions Care Management Documentation:        Hospital Admission Date/Discharge Date: 04/16/2022 To 04/20/2022     Discharge Diagnosis at the time of discharge:    1. Epididymitis         Hospital Area Discharged From: [facility])     Discharge Disposition:  Home or Self Care    Pending Labs at Discharge:     PENDING MICRO RESULTS: Blood cultures (NGTD)      Course of Hospital Stay(copied from hospital summary): CONCISE NARRATIVE: 62 y.o. male with a PMH of recurrent E coli UTI and pyelonephritis, ESBL, nephrolithiasis, inguinal hernia repair x2 presenting for one week of left scrotal swelling and pain with imaging most concerning for orchiepididymitis.  Some concern for torsion on presentation which was ruled out. Evaluated by ID who recommended ertapenem 1g q24h to complete 14 day course. He had hematuria at the beginning of his hospitalization that resolved, urology recommends OP follow-up.    To do:  -Follow CMP, BMP, LFTs while on ertapenem  -OP gross hematuria work-up with nephrology  -Repeat scrotal US in 2 weeks    Medications: New, Changed, or Stopped:     Medication List        START taking these medications      ertapenem  1,000 mg by Intermittent IV Infusion route every 24 hours for 11 days  for Complicated Urinary Tract Infection     heparin lock flush 10 UNIT/ML injection  5 mLs (50 units total) by Intracatheter route as needed for Line Care  Flush intravenous catheter lumen with 5 mL as directed.     sodium chloride 0.9 % solution  10 mLs by Intracatheter route as needed               Where to Get Your Medications        These medications will be mailed to you       From: [facility] [place], [place] - [address] Phone: [phone]   ertapenem  heparin lock flush 10 UNIT/ML injection  sodium chloride 0.9 % solution         Future Appointments          Apr 26 2022   03:00 PM - FOLLOW UP VISIT  [facility] - Maryjean Ka, NP           May 26 2022   08:00 AM - NEW PATIENT VISIT  [facility] - Demetrios Loll, NP               Upcoming Appointments and To Do's:    Your To Do List       [name], MD Follow up.    Specialty: Family Medicine  Contact information  [address]  [address]  [place] [address]  [phone]               Infusion[facility].    Specialty: Home Health Services  Why: your vendor for IV supplies, IV care and lab draws  Contact information  [address]  [place] [address]  [phone]                              Orma Render, RN  04/21/2022

## 2022-04-22 ENCOUNTER — Other Ambulatory Visit: Payer: Self-pay

## 2022-04-22 LAB — BLOOD CULTURE
Bacterial Blood Culture: 0
Bacterial Blood Culture: 0

## 2022-04-23 ENCOUNTER — Other Ambulatory Visit: Payer: Self-pay

## 2022-04-23 ENCOUNTER — Other Ambulatory Visit: Payer: BLUE CROSS/BLUE SHIELD

## 2022-04-23 DIAGNOSIS — N451 Epididymitis: Secondary | ICD-10-CM

## 2022-04-23 DIAGNOSIS — N390 Urinary tract infection, site not specified: Secondary | ICD-10-CM

## 2022-04-23 MED ORDER — SODIUM CHLORIDE 0.9 % INJ (FLUSH) WRAPPED *I*
10.0000 mL | Status: AC | PRN
Start: 2022-04-23 — End: 2022-04-23
  Administered 2022-04-23: 20 mL

## 2022-04-23 MED ORDER — HEPARIN LOCK FLUSH 10 UNIT/ML IJ SOLN WRAPPED *I*
50.0000 [IU] | INTRAVENOUS | Status: AC | PRN
Start: 2022-04-23 — End: 2022-04-23
  Administered 2022-04-23: 50 [IU]

## 2022-04-23 NOTE — Nursing Note (Signed)
Patient seen for PICC dressing change on 04/23/22    PICC dressing changed per order. Site WNL with some old dried blood under dressing. No active bleeding noted. Blood return brisk, line flushed with ease.     Patient reports feeling overall well today, only complaint is some minor testicular discomfort    Next visit: Tuesday for labs

## 2022-04-23 NOTE — Progress Notes (Signed)
04/23/22 1026   OPAT - Sign On Note   Start Date 04/20/22   ABX ID [id number]   Start Location [facility]   Diagnosis Urinary tract infection   Organisms   (ESBL;E.Coli)   Antibiotics Ertapenem   Infusion Pharmacy [facility]   Nursing Agencies Vendor Only   ID Fellows/APPs Allie Bossier, NP   IV Access PICC   Labs CBC with diff;CMP   Anticipated therapy end date 05/01/22

## 2022-04-26 ENCOUNTER — Encounter: Payer: Self-pay | Admitting: Family

## 2022-04-26 ENCOUNTER — Other Ambulatory Visit: Payer: Self-pay

## 2022-04-26 ENCOUNTER — Ambulatory Visit: Payer: BLUE CROSS/BLUE SHIELD | Admitting: Family

## 2022-04-26 ENCOUNTER — Ambulatory Visit: Payer: BLUE CROSS/BLUE SHIELD

## 2022-04-26 VITALS — BP 131/70 | HR 92 | Temp 97.3°F | Resp 19 | Ht 76.0 in | Wt 298.0 lb

## 2022-04-26 DIAGNOSIS — N390 Urinary tract infection, site not specified: Secondary | ICD-10-CM

## 2022-04-26 DIAGNOSIS — Z792 Long term (current) use of antibiotics: Secondary | ICD-10-CM

## 2022-04-26 DIAGNOSIS — N451 Epididymitis: Secondary | ICD-10-CM

## 2022-04-26 NOTE — Progress Notes (Signed)
ID CLINIC FOLLOW-UP NOTE      Reason for visit:  Post hospitalization follow up for MDR ESBL E.coli UTI    History of presenting illness:     As you know, Jorge Boyd is a 62 y.o. male with a past medical history for Afib, nephrolithiasis, osteoarthritis, and pyelonephritis who presented to the ED on 04/16/22 with left testicular pain and swelling.  Presentation concerning for epididymitis in the setting of recent MDR ESBL+ E coli obtained in outpatient clinic.  Upon arrival, he was afebrile with leukocytosis of 13.4.  US scrotum and testicles showed enlarged heterogeneous left testicle and epididymis with increased vascularity consistent with epididymal orchitis. Blood cultures obtained along with urinalysis and culture. Gonorrhea and chlamydia negative.   Empirically he was given Ertapenem, Vancomycin and Clindamycin. Prior to presentation, he was seen by his PCP (03/12) for scrotal swelling/pain and fever, he was given a 5 day course of Levofloxacin.  Urinalysis showed 11-20 WBCs no bacteria +2 squamous cells and cultures grew 10-50,000 ESBL positive E. coli. He was discharged with PICC and Ertapenem for 2 weeks.     Today, Mr Jorge Boyd is doing well.   He is doing the administration of the Ertapenem once daily.  PICC has been functioning well and offers no concerns.  He feels he is improving, continues to have some testicular discomfort due to residual swelling. Appetite is good, denies fever, chills, nausea, diarrhea, rashes, or malaise.      Additional Patient History:     Past Medical History:   Past Medical History:   Diagnosis Date    Atrial fibrillation     COVID-19 02/2019    Diverticulitis     Gout     Nephrolithiasis     Pyelonephritis 12/2018    ESBL E coli    Sepsis 12/2018    due to pyelonephritis     Shingles 2016     Past Surgical History:  Past Surgical History:   Procedure Laterality Date    ANKLE FRACTURE SURGERY Left     COLONOSCOPY  11/06/2021    2 polys (tubular adenoma). severe  diverticulosis. Dr Lennon Alstrom    FOREARM FRACTURE SURGERY Left     HIP REPLACEMENT Right 10/2019    in [facility]    INGUINAL HERNIA REPAIR Right 07/2017    INGUINAL HERNIA REPAIR Left 2012    LITHOTRIPSY  2021    x2    PR ARTHRP ACETBLR/PROX FEM PROSTC AGRFT/ALGRFT Left 09/09/2021    Procedure: LEFT ARTHROPLASTY, HIP, TOTAL, ANTERIOR APPROACH;  Surgeon: Zella Ball, MD;  Location: [facility];  Service: Orthopedics    UMBILICAL HERNIA REPAIR  07/2018    incisional hernia repaired     Allergies:  Allergies   Allergen Reactions    Demerol Hcl [Meperidine] Nausea And Vomiting     Current Medications:  Current Outpatient Medications on File Prior to Visit   Medication Sig Dispense Refill    heparin lock flush 10 UNIT/ML injection 5 mLs (50 units total) by Intracatheter route as needed for Line Care  Flush intravenous catheter lumen with 5 mL as directed. (Patient taking differently: 5 mLs by Intracatheter route as needed for Line Care. Flush PICC with 5mL Heparin at end of each use.  Indications: Line care) 175 mL 5    sodium chloride 0.9 % solution 10 mLs by Intracatheter route as needed (Patient taking differently: 10 mLs by Intracatheter route as needed (Line care). Flush PICC with 10ml NS before and after medication  administration, and prn for line patency.   Indications: Line care) 600 mL 5    ertapenem 1,000 mg by Intermittent IV Infusion route every 24 hours for 11 days  for Complicated Urinary Tract Infection 1 each 0    hydroCHLOROthiazide (HYDRODIURIL) 25 mg tablet TAKE 1 TABLET BY MOUTH EVERY DAY IN THE MORNING 90 tablet 1    potassium citrate (UROCIT-K) 10 mEq (1080 mg) CR tablet TAKE 1 TABLET (10 MEQ TOTAL) BY MOUTH DAILY FOR HX OF KIDNEY STONES 90 tablet 1    tadalafil (CIALIS) 5 MG tablet TAKE 1 TABLET (5 MG TOTAL) BY MOUTH DAILY. 30 tablet 5    TIADYLT ER 120 MG 24 hr capsule TAKE 1 CAPSULE (120 MG TOTAL) BY MOUTH DAILY FOR HIGH BLOOD PRESSURE DISORDER 90 capsule 1    tamsulosin (FLOMAX) 0.4 mg capsule Take 1  capsule (0.4 mg total) by mouth daily 90 capsule 1    allopurinol (ZYLOPRIM) 300 mg tablet TAKE 1 TABLET BY MOUTH EVERY DAY 90 tablet 1    polyethylene glycol (GLYCOLAX,MIRALAX) 17 g powder packet Take by mouth daily      aspirin 81 mg EC tablet Take 1 tablet (81 mg total) by mouth daily  Resume after DVT prophylaxis completed      Cholecalciferol (VITAMIN D) 125 MCG (5000 UT) CAPS Take 1 capsule by mouth daily  for Vitamin D Deficiency      biotin 5 MG tablet Take 1 tablet (5 mg total) by mouth daily  for Vitamin Deficiency      ascorbic acid (VITAMIN C) 1,000 mg tablet Take 1 tablet (1,000 mg total) by mouth daily  for promote bone healing      Cranberry 4200 MG CAPS Take 1 capsule by mouth daily  for urinary issues      Zinc 50 MG TABS Take 1 tablet by mouth daily  for Zinc Deficiency      co-enzyme Q-10 100 MG oral solid Take 1 each (100 mg total) by mouth daily  for Heart Rhythm Disorder      Multiple Vitamin (MULTIVITAMIN PO) Take 1 tablet by mouth daily  for vitamin supplement       No current facility-administered medications on file prior to visit.     Review of Systems:  As noted in HPI, otherwise unremarkable.     Objective:     Blood pressure 131/70, pulse 92, temperature 36.3 C (97.3 F), resp. rate 19, height 1.93 m (6\' 4" ), weight 135.2 kg (298 lb), SpO2 96%.    Physical Exam:  General:  Alert, well nourished, no acute distress  HEENT: no scleral icterus, moist mucous membranes  Neck: Supple  Respiratory: clear to auscultation bilaterally, non labored breathing  Cardiovascular: RRR, normal S1/S2, no murmur appreciated  Abdomen: soft, non-tender, non-distended, no organomegaly  Skin: LUE PICC dressing intact, no signs of localized infection  Neurologic: alert and oriented.     Recent Lab, Micro, and Imaging Studies:      Latest Reference Range & Units 04/16/22 17:58 04/18/22 01:22 04/19/22 01:43 04/20/22 04:10   WBC 3.5 - 11.0 THOU/uL 13.5 (H) 8.9 7.9 9.5   (H): Data is abnormally high      Assessment/Plan:     Jorge Boyd is a 62 y.o. male with a past medical history for Afib, nephrolithiasis, osteoarthritis, and pyelonephritis who was seen by his PCP for scrotal pain and fever.  He was treated with 5 days of Levofloxacin for MDR ESBL+ E coli .  He  presented to the ED on 03/15 with testicular pain and swelling.  He was afebrile with leukocytosis of 13.4.  US scrotum and testicles showed enlarged heterogeneous left testicle and epididymis with increased vascularity consistent with epididymal orchitis. He was given Ertapenem, Vancomycin and Clindamycin and narrowed to Ertapenem alone.  Plan is to treat for 2 weeks with IV Ertapenem with end date of 05/01/22.  Lab work to be drawn at time of PICC discontinuation.  Recommendations:  Continue Ertapenem 1 gram once daily  Call with fever or chills  Follow up as needed    This patient is on a complex outpatient antibiotic regimen which requires regular laboratory evaluation and patient monitoring for toxicity, adverse drug effects,  dose adjustments, and response to treatment.  The patient is being treated for an infection that if untreated is potentially threatening to life or bodily function.      I am treating 1 chronic problem with life threatening exacerbation: No   I am treating 1 acute problem that poses a threat to bodily function or life: Yes   I ordered tests as specified in my note: Yes    Maryjean Ka, NP Infectious Disease

## 2022-04-26 NOTE — Patient Instructions (Signed)
Continue Ertapenem 1 gram once daily  Call with fever or chills  Follow up as needed

## 2022-04-27 ENCOUNTER — Other Ambulatory Visit: Payer: Self-pay

## 2022-04-27 ENCOUNTER — Ambulatory Visit: Payer: BLUE CROSS/BLUE SHIELD | Admitting: Primary Care

## 2022-04-28 ENCOUNTER — Other Ambulatory Visit: Payer: Self-pay

## 2022-04-28 NOTE — Home Health Plan of Care Certification Statement (Signed)
I certify/recertify that this patient is under my care and I have authorized services on this Plan of Care and will periodically review the plan.

## 2022-04-29 NOTE — Telephone Encounter (Signed)
Patient already scheduled for 05/26/22.

## 2022-04-30 ENCOUNTER — Telehealth: Payer: Self-pay

## 2022-04-30 DIAGNOSIS — N451 Epididymitis: Secondary | ICD-10-CM

## 2022-04-30 NOTE — Telephone Encounter (Signed)
PEr ID provider the Pt will finish IV antibiotics tomorrow and PICC line can be removed.    Message sent to home care RN that Pts PICC line can be removed tomorrow and it was asked that one more set of labs be drawn before the PICC line is removed.    [facility] notified that Pt is done with IV antibiotics tomorrow and PICC line will be removed.

## 2022-05-01 ENCOUNTER — Other Ambulatory Visit
Admission: RE | Admit: 2022-05-01 | Discharge: 2022-05-01 | Disposition: A | Discharge status: Home / Self Care | Payer: BLUE CROSS/BLUE SHIELD | Source: Ambulatory Visit | Attending: Infectious Diseases | Admitting: Infectious Diseases

## 2022-05-01 ENCOUNTER — Other Ambulatory Visit: Payer: BLUE CROSS/BLUE SHIELD

## 2022-05-01 ENCOUNTER — Other Ambulatory Visit: Payer: Self-pay

## 2022-05-01 DIAGNOSIS — Z452 Encounter for adjustment and management of vascular access device: Secondary | ICD-10-CM | POA: Insufficient documentation

## 2022-05-01 LAB — COMPREHENSIVE METABOLIC PANEL
ALT: 37 U/L (ref 0–50)
AST: 33 U/L (ref 0–50)
Albumin: 4.2 g/dL (ref 3.5–5.2)
Alk Phos: 119 U/L (ref 40–130)
Anion Gap: 13 (ref 7–16)
Bilirubin,Total: 0.6 mg/dL (ref 0.0–1.2)
CO2: 24 mmol/L (ref 20–28)
Calcium: 9.7 mg/dL (ref 8.6–10.2)
Chloride: 101 mmol/L (ref 96–108)
Creatinine: 1.01 mg/dL (ref 0.67–1.17)
Glucose: 106 mg/dL — ABNORMAL HIGH (ref 60–99)
Lab: 22 mg/dL — ABNORMAL HIGH (ref 6–20)
Potassium: 4.5 mmol/L (ref 3.3–5.1)
Sodium: 138 mmol/L (ref 133–145)
Total Protein: 6.7 g/dL (ref 6.3–7.7)
eGFR BY CREAT: 85 *

## 2022-05-01 LAB — CBC AND DIFFERENTIAL
Baso # K/uL: 0.1 10*3/uL (ref 0.0–0.2)
Eos # K/uL: 0.4 10*3/uL (ref 0.0–0.5)
Hematocrit: 44 % (ref 37–52)
Hemoglobin: 14.6 g/dL (ref 12.0–17.0)
IMM Granulocytes #: 0 10*3/uL (ref 0.0–0.0)
IMM Granulocytes: 0.5 %
Lymph # K/uL: 1.7 10*3/uL (ref 1.0–5.0)
MCV: 92 fL (ref 75–100)
Mono # K/uL: 0.7 10*3/uL (ref 0.1–1.0)
Neut # K/uL: 4.9 10*3/uL (ref 1.5–6.5)
Nucl RBC # K/uL: 0 10*3/uL (ref 0.0–0.0)
Nucl RBC %: 0 /100 WBC (ref 0.0–0.2)
Platelets: 331 10*3/uL (ref 150–450)
RBC: 4.7 MIL/uL (ref 4.0–6.0)
RDW: 13.5 % (ref 0.0–15.0)
Seg Neut %: 63.3 %
WBC: 7.7 10*3/uL (ref 3.5–11.0)

## 2022-05-01 MED ORDER — SODIUM CHLORIDE 0.9 % INJ (FLUSH) WRAPPED *I*
10.0000 mL | Status: AC | PRN
Start: 2022-05-01 — End: 2022-05-01
  Administered 2022-05-01: 20 mL

## 2022-05-01 NOTE — Nursing Note (Signed)
Home Visit for Lab draw and PICC removal: 05/01/22    Patient complaints/concerns: none     Labs drawn and dropped off at [facility] lab. PICC removed per policy and dressing applied. No bleeding noted. Patient instructed to sit for 30 minutes and keep dressing on for 24hrs. Verbalized understanding.     Patient will be discharged from [facility] now that treatment is complete

## 2022-05-02 ENCOUNTER — Ambulatory Visit: Payer: BLUE CROSS/BLUE SHIELD

## 2022-05-03 ENCOUNTER — Other Ambulatory Visit: Payer: Self-pay

## 2022-05-03 NOTE — Progress Notes (Signed)
Home Infusion Service Therapy Completion      Patient Jorge Boyd has ended Ertapenem therapy with the [facility] service as of 05/03/2022. Completion of therapy was without complications.   For questions please contact the pharmacy at [phone], 8:00 AM to 4:30 PM Monday-Friday or [phone] after hours.

## 2022-05-09 ENCOUNTER — Encounter: Payer: Self-pay | Admitting: Family

## 2022-05-20 NOTE — Progress Notes (Signed)
05/20/22 1548   Conclusion of Therapy   Date Therapy Completed 05/01/22   Transition to Oral Antibiotics No oral antibiotics needed   Follow up labs or imaging needed none   Vascular access status Picc line removed   Vascular access removed 06/01/22

## 2022-05-25 ENCOUNTER — Other Ambulatory Visit: Payer: Self-pay | Admitting: Family

## 2022-05-25 DIAGNOSIS — R3989 Other symptoms and signs involving the genitourinary system: Secondary | ICD-10-CM

## 2022-05-26 ENCOUNTER — Encounter: Payer: Self-pay | Admitting: Family

## 2022-05-26 ENCOUNTER — Ambulatory Visit: Payer: BLUE CROSS/BLUE SHIELD | Admitting: Family

## 2022-05-26 ENCOUNTER — Telehealth: Payer: Self-pay | Admitting: Urology

## 2022-05-26 ENCOUNTER — Other Ambulatory Visit: Payer: Self-pay

## 2022-05-26 VITALS — Ht 76.0 in | Wt 298.0 lb

## 2022-05-26 DIAGNOSIS — N451 Epididymitis: Secondary | ICD-10-CM

## 2022-05-26 DIAGNOSIS — N390 Urinary tract infection, site not specified: Secondary | ICD-10-CM

## 2022-05-26 DIAGNOSIS — R3989 Other symptoms and signs involving the genitourinary system: Secondary | ICD-10-CM

## 2022-05-26 DIAGNOSIS — R310 Gross hematuria: Secondary | ICD-10-CM

## 2022-05-26 LAB — POCT URINALYSIS DIPSTICK
Blood,UA POCT: NEGATIVE
Glucose,UA POCT: NORMAL mg/dL
Ketones,UA POCT: NEGATIVE mg/dL
Leuk Esterase,UA POCT: NEGATIVE
Lot #: 70153302
Nitrite,UA POCT: NEGATIVE
PH,UA POCT: 5 (ref 5–8)
Specific gravity,UA POCT: 1.015 (ref 1.002–1.030)

## 2022-05-26 NOTE — Telephone Encounter (Signed)
Patient scheduled on: 09/13/22  Appointment type: cysto  Patient needs appointment near: soon after may 24th (imaging)  Patient Preferences: na

## 2022-05-26 NOTE — Patient Instructions (Signed)
Cystoscopy Frequently Asked Questions    Indication:  To evaluate blood in the urine, to assess possible causes of incontinence.    Do we use a numbing agent?   We may use 2% lidocaine jelly, depending on your diagnosis.  It is a gel that is placed into the urethra.  No needle is used.  The numbing effect will last 1 to 2 hours.  Patient will still be able to feel the urge to urinate.    Should I eat or drink the day of the procedure?  Yes!  Please eat and drink as normal the day of the procedure.  Being well hydrated will help the doctor better visualize the ureter and kidney function.    Are there any medications I should stop taking before the procedure?  No.  You may take all your medications.    How long will the procedure take?  You should plan to be in the office for at least an hour to allow time for paperwork and prep.  From the time the doctor enters the room, the procedure itself takes between 5 to 15 minutes to complete.    What will happen during my procedure?  You will be asked to provide a urine specimen prior to the procedure to ensure there is no current infection.  If you are found to have an infection, the procedure may be rescheduled at the Doctors discretion.  You will be brought back to a procedure room, vital signs will be taken, and you will be asked to sign a consent form.  You will then be asked to undress from the waist down, and you will lie down on the procedure table.  Your genitals will be cleansed with an antibacterial soap, and the lidocaine gel will be instilled into the urethra.  The doctor will insert the cystoscope into the bladder via the urethra.  He will be able to visualize the urethra, the bladder and the ureters.  A sample of the fluid taken from the bladder will be sent to pathology to look for any abnormal cells.  The result takes 7-10 days to come back.  We will either call you with the result, or your doctor may have you return to the office to discuss the results of  all tests.    What kind of scope is used?  We use a flexible cystoscope in our office.    Will it hurt?  Most people feel a large amount of pressure, a strong urge to urinate and a little burning along the urethra, although the experience is is different for everyone.      Will I have antibiotics afterward?  This will be at the discretion of your doctor.    How long before I may resume sexual activity?  The same day.      Will I have any after effects?  You may experience burning with urination and a small amount of blood in the urine for a few days afterwards.  This is normal.  Drink plenty of fluids for a few days to keep the urinary tract flushed out.    Contact your Doctor if you develop the following:  · Excessive pain  · Prolonged excessive bleeding or temperature greater then 101 degrees fahrenheit     Will I need to be out of work or restrict my activities for any length of time afterward?  You may return to all normal activities the same day.

## 2022-05-26 NOTE — Progress Notes (Signed)
Urology New Patient Visit     CHIEF COMPLAINT:  epididymitis, gross hematuria       HISTORY OF PRESENT ILLNESS: Jorge Boyd is a 62 y.o. male with PMH significant for A. Fib, Diverticulitis, Hx ESBL E. Coli Pyelonephritis, Kidney stones s/p lithotripsy 2021, Inguinal hernia repair 2012 and 2019  who is here for a new patient visit for epididymitis, gross hematuria.  Patient referred by [name], MD.     He was seen in the ED on 3/15 for concerns of left testicular pain/swelling.     Per Urology consult while in hospital: "Pt reports noting left scrotal swelling and tenderness on Sunday night.  Monday morning he noticed it was about the size of a small lemon and attempted to treat with ice, Motrin, and Tylenol. Pt was seen by his PCP on Tuesday, 3/12. He also reported low grade fevers and hematuria. He was started on Levaquin for epididymoorchitis. 3/12 Urine culture grew 10-50k ESBL E. Coli, Levo resistant, so he was changed to Bactrim DS on 3/14 he also noted having fevers and feeling terrible. Per telephone notes, pt was sent for a scrotal US that had findings concerning for testicular torsion, so he was referred to the ED. He reports overall compared to last night he does feel better currently.  He reports his urine has been approximately Ice-T colored."A CT scan was completed and IV antibiotics were started.     He presents today doing well. He reports the scrotal swelling has improved. No pain. He reports his urine is now clear. He continues to take Tamsulosin 0.8 mg.     He reports 3-4 UTIs a year.     - No personal history of prostate, bladder, or renal cancer. Unsure of family history.   - No personal or family history of kidney stones.     - Never tobacco smoker/user.       Allergies   Allergen Reactions    Demerol Hcl [Meperidine] Nausea And Vomiting       Current Outpatient Medications   Medication Sig    hydroCHLOROthiazide (HYDRODIURIL) 25 mg tablet TAKE 1 TABLET BY MOUTH EVERY DAY IN  THE MORNING    potassium citrate (UROCIT-K) 10 mEq (1080 mg) CR tablet TAKE 1 TABLET (10 MEQ TOTAL) BY MOUTH DAILY FOR HX OF KIDNEY STONES    tadalafil (CIALIS) 5 MG tablet TAKE 1 TABLET (5 MG TOTAL) BY MOUTH DAILY.    TIADYLT ER 120 MG 24 hr capsule TAKE 1 CAPSULE (120 MG TOTAL) BY MOUTH DAILY FOR HIGH BLOOD PRESSURE DISORDER    tamsulosin (FLOMAX) 0.4 mg capsule Take 1 capsule (0.4 mg total) by mouth daily    allopurinol (ZYLOPRIM) 300 mg tablet TAKE 1 TABLET BY MOUTH EVERY DAY    polyethylene glycol (GLYCOLAX,MIRALAX) 17 g powder packet Take 17 g by mouth daily for Constipation Reconstitute in 4-8oz water    Cholecalciferol (VITAMIN D) 125 MCG (5000 UT) CAPS Take 1 capsule by mouth daily for Vitamin D Deficiency    biotin 5 MG tablet Take 1 tablet (5 mg total) by mouth daily for Vitamin Deficiency    ascorbic acid (VITAMIN C) 1,000 mg tablet Take 1 tablet (1,000 mg total) by mouth daily for promote bone healing    Cranberry 4200 MG CAPS Take 1 capsule by mouth daily for urinary issues    Zinc 50 MG TABS Take 1 tablet by mouth daily for Zinc Deficiency    co-enzyme Q-10 100 MG oral solid Take  1 each (100 mg total) by mouth daily for Heart Rhythm Disorder    Multiple Vitamin (MULTIVITAMIN PO) Take 1 tablet by mouth daily for vitamin supplement       Past Medical History:   Diagnosis Date    Atrial fibrillation     COVID-19 02/2019    Diverticulitis     Gout     Nephrolithiasis     Pyelonephritis 12/2018    ESBL E coli    Sepsis 12/2018    due to pyelonephritis     Shingles 2016       Past Surgical History:   Procedure Laterality Date    ANKLE FRACTURE SURGERY Left     COLONOSCOPY  11/06/2021    2 polys (tubular adenoma). severe diverticulosis. Dr Lennon Alstrom    FOREARM FRACTURE SURGERY Left     HIP REPLACEMENT Right 10/2019    in [facility]    INGUINAL HERNIA REPAIR Right 07/2017    INGUINAL HERNIA REPAIR Left 2012    LITHOTRIPSY  2021    x2    PR ARTHRP ACETBLR/PROX FEM PROSTC AGRFT/ALGRFT Left 09/09/2021    Procedure: LEFT  ARTHROPLASTY, HIP, TOTAL, ANTERIOR APPROACH;  Surgeon: Zella Ball, MD;  Location: [facility] MAIN OR;  Service: Orthopedics    UMBILICAL HERNIA REPAIR  07/2018    incisional hernia repaired       Family History   Adopted: Yes   Problem Relation Age of Onset    Anesthesia problems Neg Hx        Social History     Socioeconomic History    Marital status: Married   Tobacco Use    Smoking status: Never    Smokeless tobacco: Never   Substance and Sexual Activity    Alcohol use: Not Currently    Drug use: Never    Sexual activity: Yes     Partners: Female     Comment: monogamous   Social History Narrative    Splits time between [place] and [place].    Lives with wife, Sofie Rower.     3 kids and 2 granddaughters.    Business Conservation officer, historic buildings.         REVIEW of SYSTEMS:    - No other interval changes in the review of systems other than what is listed in the HPI above.      Vital Signs:   Vitals:    05/26/22 0758   Weight: 135.2 kg (298 lb)   Height: 1.93 m (6\' 4" )         PHYSICAL EXAMINATION:  General Appearance:  Well appearing, pleasant, and in no acute distress.    HEENT:  Normocephalic, atraumatic.     Respiratory:  Respirations even and unlabored.    Peripheral Vascular: No edema present in upper or lower extremities. No cyanosis or clubbing.    GI:  Abdomen soft, nondistended, nontender, and free of masses.  No signs of umbilical or inguinal hernias.  GU:  Deferred.   Neurologic: Alert and oriented to person, place, and time.    Integumentary:  Skin warm to touch with normal color, turgor, and texture.       LABS & DIAGNOSTICS:  Urinalysis:   Recent Results (from the past 24 hour(s))   POCT urinalysis dipstick    Collection Time: 05/26/22  8:33 AM   Result Value Ref Range    Specific gravity,UA POCT 1.015 1.002 - 1.030    PH,UA POCT 5.0 5 - 8  Leuk Esterase,UA POCT Negative Negative    Nitrite,UA POCT Negative Negative    Protein,UA POCT Test Not Performed (!) Negative mg/dL     Glucose,UA POCT Normal Normal mg/dL    Ketones,UA POCT Negative Negative mg/dL    Urobilinogen,UA Test Not Performed Less than 1 mg/dL    Bilirubin,Ur Test Not Performed Negative, Test Not Performed    Blood,UA POCT Negative Negative    Exp date 11/01/22     Lot # [id number]            PSA Levels:   Lab Results   Component Value Date    PSAR3 2.82 05/19/2021    PSAR3 2.76 10/03/2020         General Chemistry:      Lab results: 05/01/22  0906 04/19/22  0143 04/18/22  0122 04/16/22  1758 02/17/22  1216   Sodium 138 142 138   < > 141   Potassium 4.5 3.7 3.6   < > 4.1   Chloride 101 104 100   < > 102   CO2 24 23 24    < > 26   UN 22* 19 17   < > 25*   Creatinine 1.01 0.94 1.07   < > 1.36*   Glucose 106* 131* 117*   < > 97   Calcium 9.7 9.2 9.4   < > 9.5   Total Protein 6.7 6.0*  --   --  6.4   Albumin 4.2 3.9  --   --  4.3   ALT 37 48  --   --  33   AST 33 39  --   --  31   Alk Phos 119 114  --   --  95   Bilirubin,Total 0.6 0.4  --   --  0.8    < > = values in this interval not displayed.             IMAGING RESULTS:    PICC insertion > 5 years  Robyne Askew, RN     04/19/2022  4:08 PM    PICC ASSESSMENT/PROCEDURE NOTE    Pre-Procedure Assessment/Information-    Robyne Askew, RN received Handoff Report from Ernestene Kiel RN   for PICC procedure     Patient Vascular History/Contraindications: No Known Problems  Nephrology note required for PICC?: N/A  Labs Reviewed:      Lab results: 04/19/22  0143   eGFR BY CREAT 93     No results found for: "CREATININE"      Lab results: 04/19/22  0143   WBC 7.9         Lab results: 08/31/21  0930   INR 1.0         Lab results: 04/19/22  0143   Platelets 255     Extremity Assessment:  WNL  Indications:  Long Term Abx  Education Provided to Patient /Family: yes    Procedure Information-  Time out documentation completed prior to procedure: Yes  Lot #: [id number]                                        Size:4 French Single Lumen PICC  Side:left (history of left sided PICC  placements)    Procedure Date :04/19/2022  Ultrasound used:yes  Modified Seldinger technique used.  Catheter exchange: no  Vein Accessed: basilic  Venipuncture attempts & sites: 1  Guide Wire advancement: easy  Catheter Advancement: easy  Switched arms: no  Procedure: successful  Guide Wire Removed : yes    Placement Details:   Total length of trimmed line: 55cm  External catheter length: 3cm  PICC Securement: Adhesive  Attention (if subcutaneous securement device used): This   patient's catheter is secured with SecurAcath instead of a   traditional suture or statlock. This is a subcutaneous engineered   stabilization device that holds the catheter just below the skin   with nitinol feet and a friction fit grip around the catheter.  Do not open, change or remove the device.   SecurAcath may be disinfected during a dressing change, but do   not open or remove it.   SecurAcath acts like a hinge - lift, clean 360 degrees around,   let dry and apply new dressing.   SecurAcath is compatible with all dressings.   A Statlock is not necessary when SecurAcath is present.  Patients can go to MRI with SecurAcath device in place.   24 hour SecurAcath Support [phone]    For further details please see Doc Flowsheets IV assessment.    Complications: None    Condition:    Comfort Measures Provided : Pain Medication as Ordered by   Provider     Placement Verification:  PICC tip verified by ECG Tip Confirmation System: Yes  ECG rhythm strip placed in chart and uploaded to patient medical   record: Yes  VBG WNL: Yes  STAT Portable Chest X-Ray Ordered :N/A    EBL: Minimal    Disposition: Inpatient Unit    Handoff Info:    Patient Patient tolerated procedure well   Specimen Collection: yes  Procedure Dressing Site located:Left Arm  Dressing Type:occlusive  Biopatch:Yes  Dressing status:Clean, dry and intact   Hematoma:Not evident  Medication received:Lidocaine 1%  Implant patient information given to patient or    parent/guardian:Yes  Report given to:Unit Nurse. Unit G16 Name Ernestene Kiel RN .    Floor Nurse is aware that OK to Use PICC order must be placed   prior to use of PICC.  PICC inserted by Gwenith Daily RN [facility]    Scribe:   Robyne Askew, RN  04/19/2022  3:54 PM               PROCEDURES:    POST VOID RESIDUAL:    - Bladder scan performed during today's visit: No          ASSESSMENT/PLAN:  1. Epididymitis  - symptoms improving  - will repeat scrotal US  - continue supportive measures including supportive underwear and elevation     2. Gross Hematuria  - discussed at length possible causes of gross hematuria including stones and tumors  - at this time we will move forward with hematuria work up   - CT Urogram followed by cystoscopy with Dr. Lucita Ferrara          Demetrios Loll, NP   Department of Urology   UR Medicine    The appointment was concluded after confirming with the patient that they fully understand the above treatment plan and denied having any further questions or concerns. The patient was instructed to call if any issues or concerns develop prior to their next appointment.     Management consultant may have been used.To expedite communication, some inadvertent typographical and transcription errors generated by the transcription software may have been missed despite a reasonable effort to identify and  correct them.

## 2022-05-28 NOTE — Telephone Encounter (Signed)
Patient has been placed on the wait list. Unfortunately at this time, no sooner appointments available. Patient will be called and notified of any cancellations that occur between now and then and offered those appointments.       Once imaging results are obtained - provider will review and advise on any urgency for cysto to be moved up

## 2022-06-02 ENCOUNTER — Other Ambulatory Visit: Payer: Self-pay | Admitting: Family Medicine

## 2022-06-02 DIAGNOSIS — Z8739 Personal history of other diseases of the musculoskeletal system and connective tissue: Secondary | ICD-10-CM

## 2022-06-02 NOTE — Telephone Encounter (Signed)
Last office visit with doctor:   04/13/2022  Last office visit with APP:   02/17/2022  Patients upcoming appointments:  Future Appointments   Date Time Provider Department Center   06/25/2022  8:00 AM RIS, MP US2 [facility] [place]   06/25/2022  9:45 AM RIS, MP CT1 MIC [place]   09/13/2022  8:45 AM Theresia Majors, MD USG None     Recent Lab results:  GENERAL CHEMISTRY   Recent Labs     05/01/22  0906 04/19/22  0143 04/18/22  0122   NA 138 142 138   K 4.5 3.7 3.6   CL 101 104 100   CO2 24 23 24    GAP 13 15 14    UN 22* 19 17   CREAT 1.01 0.94 1.07   GLU 106* 131* 117*   CA 9.7 9.2 9.4      LIPID PROFILE   No value within the past 365 days   LIVER PROFILE   Recent Labs     05/01/22  0906 04/19/22  0143 02/17/22  1216   ALT 37 48 33   AST 33 39 31   ALK 119 114 95   TB 0.6 0.4 0.8      DIABETES THYROID   Recent Labs     08/31/21  0930   HA1C 5.7*    No value within the past 365 days      Pending/Orders Labs:  Lab Frequency Next Occurrence   * Hip LEFT AP and Lateral with pelvis Once 09/11/2021   Measles IgG antibody Once 09/29/2021   Hepatitis C antibody Once 09/29/2021   Hemoglobin A1c Once 02/01/2022   CBC Once 02/01/2022   Comprehensive metabolic panel Once 02/01/2022   Lipid Panel (Reflex to Direct  LDL if Triglycerides more than 400) Once 02/01/2022   PSA (eff.05-2008) Once 02/01/2022   Uric acid Once 02/01/2022   US Scrotum & Contents with Testicular Doppler Complete Once 05/26/2022   CT Urogram without and with IV contrast Once 05/26/2022   Lactate, plasma (CONDITIONAL) ONE TIME

## 2022-06-22 ENCOUNTER — Encounter: Payer: Self-pay | Admitting: Orthopedic Surgery

## 2022-06-23 ENCOUNTER — Encounter: Payer: Self-pay | Admitting: Orthopedic Surgery

## 2022-06-25 ENCOUNTER — Other Ambulatory Visit: Payer: BLUE CROSS/BLUE SHIELD | Admitting: Radiology

## 2022-06-25 ENCOUNTER — Other Ambulatory Visit: Payer: BLUE CROSS/BLUE SHIELD

## 2022-06-26 ENCOUNTER — Other Ambulatory Visit: Payer: Self-pay | Admitting: Family Medicine

## 2022-06-26 NOTE — Telephone Encounter (Signed)
Last office visit with doctor:   04/13/2022  Last office visit with APP:   02/17/2022  Patients upcoming appointments:  No future appointments.  Recent Lab results:  GENERAL CHEMISTRY   Recent Labs     05/01/22  0906 04/19/22  0143 04/18/22  0122   NA 138 142 138   K 4.5 3.7 3.6   CL 101 104 100   CO2 24 23 24   GAP 13 15 14   UN 22* 19 17   CREAT 1.01 0.94 1.07   GLU 106* 131* 117*   CA 9.7 9.2 9.4      LIPID PROFILE   No value within the past 365 days   LIVER PROFILE   Recent Labs     05/01/22  0906 04/19/22  0143 02/17/22  1216   ALT 37 48 33   AST 33 39 31   ALK 119 114 95   TB 0.6 0.4 0.8      DIABETES THYROID   Recent Labs     08/31/21  0930   HA1C 5.7*    No value within the past 365 days      Pending/Orders Labs:  Lab Frequency Next Occurrence   * Hip LEFT AP and Lateral with pelvis Once 09/11/2021   Measles IgG antibody Once 09/29/2021   Hepatitis C antibody Once 09/29/2021   Hemoglobin A1c Once 02/01/2022   CBC Once 02/01/2022   Comprehensive metabolic panel Once 02/01/2022   Lipid Panel (Reflex to Direct  LDL if Triglycerides more than 400) Once 02/01/2022   PSA (eff.05-2008) Once 02/01/2022   Uric acid Once 02/01/2022   US Scrotum & Contents with Testicular Doppler Complete Once 05/26/2022   CT Urogram without and with IV contrast Once 05/26/2022   Lactate, plasma (CONDITIONAL) ONE TIME

## 2022-06-30 ENCOUNTER — Other Ambulatory Visit: Payer: Self-pay | Admitting: Family Medicine

## 2022-06-30 NOTE — Telephone Encounter (Signed)
Last office visit with doctor:   04/13/2022  Last office visit with APP:   02/17/2022  Patients upcoming appointments:  No future appointments.  Recent Lab results:  GENERAL CHEMISTRY   Recent Labs     05/01/22  0906 04/19/22  0143 04/18/22  0122   NA 138 142 138   K 4.5 3.7 3.6   CL 101 104 100   CO2 24 23 24    GAP 13 15 14    UN 22* 19 17   CREAT 1.01 0.94 1.07   GLU 106* 131* 117*   CA 9.7 9.2 9.4      LIPID PROFILE   No value within the past 365 days   LIVER PROFILE   Recent Labs     05/01/22  0906 04/19/22  0143 02/17/22  1216   ALT 37 48 33   AST 33 39 31   ALK 119 114 95   TB 0.6 0.4 0.8      DIABETES THYROID   Recent Labs     08/31/21  0930   HA1C 5.7*    No value within the past 365 days      Pending/Orders Labs:  Lab Frequency Next Occurrence   * Hip LEFT AP and Lateral with pelvis Once 09/11/2021   Measles IgG antibody Once 09/29/2021   Hepatitis C antibody Once 09/29/2021   Hemoglobin A1c Once 02/01/2022   CBC Once 02/01/2022   Comprehensive metabolic panel Once 02/01/2022   Lipid Panel (Reflex to Direct  LDL if Triglycerides more than 400) Once 02/01/2022   PSA (eff.05-2008) Once 02/01/2022   Uric acid Once 02/01/2022   US Scrotum & Contents with Testicular Doppler Complete Once 05/26/2022   CT Urogram without and with IV contrast Once 05/26/2022   Lactate, plasma (CONDITIONAL) ONE TIME

## 2022-07-05 ENCOUNTER — Telehealth: Payer: Self-pay | Admitting: Family Medicine

## 2022-07-05 ENCOUNTER — Encounter: Payer: Self-pay | Admitting: Family Medicine

## 2022-07-05 NOTE — Telephone Encounter (Signed)
Patient called in to be seen today for an on going sinus infection, that doesn't seem to let up. He has tried many over the counter medications with no relief. I told patient that we don't have any appointments today. I urged him to go to urgent care which he said he doesn't feel comfortable. So I told him to call at 7:45 Thursday for a same day appointment.

## 2022-07-05 NOTE — Telephone Encounter (Signed)
Patient called regarding sinus infection-wanted to make an appt today 07/05/22    No appts available with Dr. Hermelinda Medicus and mid levels      Offered patient ODVV-patient mentioned when he does that they always refer him back to PCP    Please advise  [name]  [phone] [facility] Phone)

## 2022-08-16 ENCOUNTER — Other Ambulatory Visit: Payer: Self-pay | Admitting: Family Medicine

## 2022-08-16 NOTE — Telephone Encounter (Signed)
Last office visit with doctor:   04/13/2022  Last office visit with APP:   02/17/2022  Patients upcoming appointments:  No future appointments.  Recent Lab results:  GENERAL CHEMISTRY   Recent Labs     05/01/22  0906 04/19/22  0143 04/18/22  0122   NA 138 142 138   K 4.5 3.7 3.6   CL 101 104 100   CO2 24 23 24   GAP 13 15 14   UN 22* 19 17   CREAT 1.01 0.94 1.07   GLU 106* 131* 117*   CA 9.7 9.2 9.4      LIPID PROFILE   No value within the past 365 days   LIVER PROFILE   Recent Labs     05/01/22  0906 04/19/22  0143 02/17/22  1216   ALT 37 48 33   AST 33 39 31   ALK 119 114 95   TB 0.6 0.4 0.8      DIABETES THYROID   Recent Labs     08/31/21  0930   HA1C 5.7*    No value within the past 365 days      Pending/Orders Labs:  Lab Frequency Next Occurrence   * Hip LEFT AP and Lateral with pelvis Once 09/11/2021   Measles IgG antibody Once 09/29/2021   Hepatitis C antibody Once 09/29/2021   Hemoglobin A1c Once 02/01/2022   CBC Once 02/01/2022   Comprehensive metabolic panel Once 02/01/2022   Lipid Panel (Reflex to Direct  LDL if Triglycerides more than 400) Once 02/01/2022   PSA (eff.05-2008) Once 02/01/2022   Uric acid Once 02/01/2022   US Scrotum & Contents with Testicular Doppler Complete Once 05/26/2022   CT Urogram without and with IV contrast Once 05/26/2022   Lactate, plasma (CONDITIONAL) ONE TIME

## 2022-08-17 ENCOUNTER — Other Ambulatory Visit: Payer: Self-pay | Admitting: Family Medicine

## 2022-08-17 ENCOUNTER — Encounter: Payer: Self-pay | Admitting: Family Medicine

## 2022-08-17 DIAGNOSIS — N400 Enlarged prostate without lower urinary tract symptoms: Secondary | ICD-10-CM

## 2022-08-17 MED ORDER — TAMSULOSIN HCL 0.4 MG PO CAPS *I*
0.40 mg | ORAL_CAPSULE | Freq: Every day | ORAL | 1 refills | Status: AC
Start: 2022-08-17 — End: ?

## 2022-08-17 NOTE — Telephone Encounter (Signed)
Last office visit with doctor:   04/13/2022  Last office visit with APP:   02/17/2022  Patients upcoming appointments:  No future appointments.  Recent Lab results:  GENERAL CHEMISTRY   Recent Labs     05/01/22  0906 04/19/22  0143 04/18/22  0122   NA 138 142 138   K 4.5 3.7 3.6   CL 101 104 100   CO2 24 23 24   GAP 13 15 14   UN 22* 19 17   CREAT 1.01 0.94 1.07   GLU 106* 131* 117*   CA 9.7 9.2 9.4      LIPID PROFILE   No value within the past 365 days   LIVER PROFILE   Recent Labs     05/01/22  0906 04/19/22  0143 02/17/22  1216   ALT 37 48 33   AST 33 39 31   ALK 119 114 95   TB 0.6 0.4 0.8      DIABETES THYROID   Recent Labs     08/31/21  0930   HA1C 5.7*    No value within the past 365 days      Pending/Orders Labs:  Lab Frequency Next Occurrence   * Hip LEFT AP and Lateral with pelvis Once 09/11/2021   Measles IgG antibody Once 09/29/2021   Hepatitis C antibody Once 09/29/2021   Hemoglobin A1c Once 02/01/2022   CBC Once 02/01/2022   Comprehensive metabolic panel Once 02/01/2022   Lipid Panel (Reflex to Direct  LDL if Triglycerides more than 400) Once 02/01/2022   PSA (eff.05-2008) Once 02/01/2022   Uric acid Once 02/01/2022   US Scrotum & Contents with Testicular Doppler Complete Once 05/26/2022   CT Urogram without and with IV contrast Once 05/26/2022   Lactate, plasma (CONDITIONAL) ONE TIME

## 2022-08-17 NOTE — Telephone Encounter (Signed)
Medication queued in a refill encounter.

## 2022-09-03 ENCOUNTER — Encounter: Payer: Self-pay | Admitting: Family Medicine

## 2022-09-03 ENCOUNTER — Other Ambulatory Visit: Payer: Self-pay | Admitting: Family Medicine

## 2022-09-03 ENCOUNTER — Other Ambulatory Visit: Payer: Self-pay | Admitting: Primary Care

## 2022-09-03 DIAGNOSIS — Z139 Encounter for screening, unspecified: Secondary | ICD-10-CM

## 2022-09-03 DIAGNOSIS — Z8739 Personal history of other diseases of the musculoskeletal system and connective tissue: Secondary | ICD-10-CM

## 2022-09-06 NOTE — Telephone Encounter (Signed)
Last office visit with doctor:   04/13/2022  Last office visit with APP:   02/17/2022  Patients upcoming appointments:  No future appointments.  Recent Lab results:  GENERAL CHEMISTRY   Recent Labs     05/01/22  0906 04/19/22  0143 04/18/22  0122   NA 138 142 138   K 4.5 3.7 3.6   CL 101 104 100   CO2 24 23 24    GAP 13 15 14    UN 22* 19 17   CREAT 1.01 0.94 1.07   GLU 106* 131* 117*   CA 9.7 9.2 9.4      LIPID PROFILE   No value within the past 365 days   LIVER PROFILE   Recent Labs     05/01/22  0906 04/19/22  0143 02/17/22  1216   ALT 37 48 33   AST 33 39 31   ALK 119 114 95   TB 0.6 0.4 0.8      DIABETES THYROID   No value within the past 365 days No value within the past 365 days      Pending/Orders Labs:  Lab Frequency Next Occurrence   * Hip LEFT AP and Lateral with pelvis Once 09/11/2021   Measles IgG antibody Once 09/29/2021   Hepatitis C antibody Once 09/29/2021   Hemoglobin A1c Once 02/01/2022   CBC Once 02/01/2022   Comprehensive metabolic panel Once 02/01/2022   Lipid Panel (Reflex to Direct  LDL if Triglycerides more than 400) Once 02/01/2022   PSA (eff.05-2008) Once 02/01/2022   Uric acid Once 02/01/2022   US Scrotum & Contents with Testicular Doppler Complete Once 05/26/2022   CT Urogram without and with IV contrast Once 05/26/2022   Aerobic culture (urine-voided) Once 09/03/2022   Urinalysis with microscopic Once 09/03/2022   Lactate, plasma (CONDITIONAL) ONE TIME

## 2022-09-13 ENCOUNTER — Other Ambulatory Visit: Payer: BLUE CROSS/BLUE SHIELD | Admitting: Urology

## 2022-09-23 ENCOUNTER — Other Ambulatory Visit
Admission: RE | Admit: 2022-09-23 | Discharge: 2022-09-23 | Disposition: A | Discharge status: Home / Self Care | Payer: BLUE CROSS/BLUE SHIELD | Source: Ambulatory Visit | Attending: Family Medicine | Admitting: Family Medicine

## 2022-09-23 ENCOUNTER — Other Ambulatory Visit: Payer: Self-pay | Admitting: Gastroenterology

## 2022-09-23 ENCOUNTER — Encounter: Payer: Self-pay | Admitting: Family Medicine

## 2022-09-23 DIAGNOSIS — Z8739 Personal history of other diseases of the musculoskeletal system and connective tissue: Secondary | ICD-10-CM | POA: Insufficient documentation

## 2022-09-23 DIAGNOSIS — Z0000 Encounter for general adult medical examination without abnormal findings: Secondary | ICD-10-CM | POA: Insufficient documentation

## 2022-09-23 DIAGNOSIS — Z139 Encounter for screening, unspecified: Secondary | ICD-10-CM | POA: Insufficient documentation

## 2022-09-23 DIAGNOSIS — N400 Enlarged prostate without lower urinary tract symptoms: Secondary | ICD-10-CM | POA: Insufficient documentation

## 2022-09-23 LAB — URINALYSIS WITH MICROSCOPIC
Bacteria,UA: NONE SEEN
Blood,UA: NEGATIVE
Glucose,UA: NEGATIVE
Hyaline Casts,UA: NONE SEEN /lpf (ref 0–5)
Ketones, UA: NEGATIVE
Nitrite,UA: NEGATIVE
Specific Gravity,UA: 1.025 (ref 1.002–1.030)
pH,UA: 6 (ref 5.0–8.0)

## 2022-09-24 LAB — AEROBIC CULTURE: Aerobic Culture: 0

## 2022-10-05 ENCOUNTER — Telehealth: Payer: Self-pay | Admitting: Family Medicine

## 2022-10-05 DIAGNOSIS — Z0000 Encounter for general adult medical examination without abnormal findings: Secondary | ICD-10-CM

## 2022-10-05 DIAGNOSIS — I10 Essential (primary) hypertension: Secondary | ICD-10-CM

## 2022-10-05 DIAGNOSIS — Z8739 Personal history of other diseases of the musculoskeletal system and connective tissue: Secondary | ICD-10-CM

## 2022-10-05 NOTE — Telephone Encounter (Signed)
Pt states he is looking for routine yearly screenings. Has no concerns about any specific labs, just for regular follow up.

## 2022-10-05 NOTE — Telephone Encounter (Signed)
Fasting labs ordered

## 2022-10-05 NOTE — Telephone Encounter (Signed)
Patient asking for labs to be ordered before his appointment on September 10th with [facility].  He is going to [place] for the Winter at the end of the month.

## 2022-10-06 NOTE — Telephone Encounter (Signed)
FYI: Pt was contacted

## 2022-10-08 ENCOUNTER — Other Ambulatory Visit
Admission: RE | Admit: 2022-10-08 | Discharge: 2022-10-08 | Disposition: A | Discharge status: Home / Self Care | Payer: BLUE CROSS/BLUE SHIELD | Source: Ambulatory Visit | Attending: Family Medicine | Admitting: Family Medicine

## 2022-10-08 DIAGNOSIS — Z0000 Encounter for general adult medical examination without abnormal findings: Secondary | ICD-10-CM | POA: Insufficient documentation

## 2022-10-08 DIAGNOSIS — Z8739 Personal history of other diseases of the musculoskeletal system and connective tissue: Secondary | ICD-10-CM | POA: Insufficient documentation

## 2022-10-08 DIAGNOSIS — I10 Essential (primary) hypertension: Secondary | ICD-10-CM | POA: Insufficient documentation

## 2022-10-08 LAB — COMPREHENSIVE METABOLIC PANEL
ALT: 30 U/L (ref 0–50)
AST: 27 U/L (ref 0–50)
Albumin: 4.7 g/dL (ref 3.5–5.2)
Alk Phos: 104 U/L (ref 40–130)
Anion Gap: 13 (ref 7–16)
Bilirubin,Total: 0.6 mg/dL (ref 0.0–1.2)
CO2: 25 mmol/L (ref 20–28)
Calcium: 9.9 mg/dL (ref 8.6–10.2)
Chloride: 102 mmol/L (ref 96–108)
Creatinine: 1.26 mg/dL — ABNORMAL HIGH (ref 0.67–1.17)
Glucose: 122 mg/dL — ABNORMAL HIGH (ref 60–99)
Lab: 29 mg/dL — ABNORMAL HIGH (ref 6–20)
Potassium: 4.4 mmol/L (ref 3.3–5.1)
Sodium: 140 mmol/L (ref 133–145)
Total Protein: 7 g/dL (ref 6.3–7.7)
eGFR BY CREAT: 65 *

## 2022-10-08 LAB — LIPID PANEL
Chol/HDL Ratio: 4.9
Cholesterol: 161 mg/dL
HDL: 33 mg/dL — ABNORMAL LOW (ref 40–60)
LDL Calculated: 104 mg/dL
Non HDL Cholesterol: 128 mg/dL
Triglycerides: 121 mg/dL

## 2022-10-08 LAB — CBC
Hematocrit: 47 % (ref 37–52)
Hemoglobin: 16.1 g/dL (ref 12.0–17.0)
MCV: 91 fL (ref 75–100)
Platelets: 228 10*3/uL (ref 150–450)
RBC: 5.1 MIL/uL (ref 4.0–6.0)
RDW: 13.5 % (ref 0.0–15.0)
WBC: 7.8 10*3/uL (ref 3.5–11.0)

## 2022-10-08 LAB — PSA (EFF.4-2010): PSA (eff. 4-2010): 2.91 ng/mL (ref 0.00–4.00)

## 2022-10-08 LAB — HEMOGLOBIN A1C: Hemoglobin A1C: 5.7 % — ABNORMAL HIGH

## 2022-10-08 LAB — URIC ACID: Urate: 6 mg/dL (ref 3.9–9.0)

## 2022-10-11 NOTE — Progress Notes (Deleted)
History and Physical    HISTORY:  No chief complaint on file.        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 severe diverticulosis. Dr Lennon Alstrom    FOREARM FRACTURE SURGERY Left     HIP REPLACEMENT Right 10/2019    in [facility]    INGUINAL HERNIA REPAIR Right 07/2017    INGUINAL HERNIA REPAIR Left 2012    LITHOTRIPSY  2021    x2    PR ARTHRP ACETBLR/PROX FEM PROSTC AGRFT/ALGRFT Left 09/09/2021    Procedure: LEFT ARTHROPLASTY, HIP, TOTAL, ANTERIOR APPROACH;  Surgeon: Zella Ball, MD;  Location: [facility];  Service: Orthopedics    UMBILICAL HERNIA REPAIR  07/2018    incisional hernia repaired         Allergies:    Allergies   Allergen Reactions    Demerol Hcl [Meperidine] Nausea And Vomiting       Current medications:    Current Outpatient Medications   Medication Sig    allopurinol (ZYLOPRIM) 300 mg tablet TAKE 1 TABLET BY MOUTH EVERY DAY    tamsulosin (FLOMAX) 0.4 mg capsule Take 1 capsule (0.4 mg total) by mouth daily.    tadalafil (CIALIS) 5 MG tablet TAKE 1 TABLET (5 MG TOTAL) BY MOUTH DAILY.    TIADYLT ER 120 MG 24 hr capsule TAKE 1 CAPSULE (120 MG TOTAL) BY MOUTH  DAILY FOR HIGH BLOOD PRESSURE DISORDER    hydroCHLOROthiazide (HYDRODIURIL) 25 mg tablet TAKE 1 TABLET BY MOUTH EVERY DAY IN THE MORNING    potassium citrate (UROCIT-K) 10 mEq (1080 mg) CR tablet TAKE 1 TABLET (10 MEQ TOTAL) BY MOUTH DAILY FOR HX OF KIDNEY STONES    polyethylene glycol (GLYCOLAX,MIRALAX) 17 g powder packet Take 17 g by mouth daily for Constipation Reconstitute in 4-8oz water    Cholecalciferol (VITAMIN D) 125 MCG (5000 UT) CAPS Take 1 capsule by mouth daily for Vitamin D Deficiency    biotin 5 MG tablet Take 1 tablet (5 mg total) by mouth daily for Vitamin Deficiency    ascorbic acid (VITAMIN C) 1,000 mg tablet Take 1 tablet (1,000 mg total) by mouth daily for promote bone healing    Cranberry 4200 MG CAPS Take 1 capsule by mouth daily for urinary issues    Zinc 50 MG  TABS Take 1 tablet by mouth daily for Zinc Deficiency    co-enzyme Q-10 100 MG oral solid Take 1 each (100 mg total) by mouth daily for Heart Rhythm Disorder    Multiple Vitamin (MULTIVITAMIN PO) Take 1 tablet by mouth daily for vitamin supplement       Family History:    Family History   Adopted: Yes   Problem Relation Age of Onset    Anesthesia problems Neg Hx        Social/Occupational History:   Social History     Socioeconomic History    Marital status: Married   Tobacco Use    Smoking status: Never    Smokeless tobacco: Never   Substance and Sexual Activity    Alcohol use: Not Currently    Drug use: Never    Sexual activity: Yes     Partners: Female     Comment: monogamous   Social History Narrative    Splits time between [place] and [place].    Lives with wife, Sofie Rower.     3 kids and 2 granddaughters.    Business Conservation officer, historic buildings.         Review of Systems:    ROS    Vital Signs:   There were no vitals taken for this visit.      PHYSICAL EXAM:  Physical Exam        Assessment:    There are no diagnoses linked to this encounter.   .      Plan:       ***

## 2022-10-12 ENCOUNTER — Other Ambulatory Visit: Payer: Self-pay

## 2022-10-12 ENCOUNTER — Encounter: Payer: Self-pay | Admitting: Family Medicine

## 2022-10-12 ENCOUNTER — Ambulatory Visit: Payer: BLUE CROSS/BLUE SHIELD | Attending: Primary Care | Admitting: Family Medicine

## 2022-10-12 VITALS — BP 128/84 | HR 84 | Temp 97.9°F | Ht 73.62 in | Wt 300.1 lb

## 2022-10-12 DIAGNOSIS — I10 Essential (primary) hypertension: Secondary | ICD-10-CM

## 2022-10-12 DIAGNOSIS — Z0000 Encounter for general adult medical examination without abnormal findings: Secondary | ICD-10-CM

## 2022-10-12 DIAGNOSIS — R7989 Other specified abnormal findings of blood chemistry: Secondary | ICD-10-CM

## 2022-10-12 DIAGNOSIS — E669 Obesity, unspecified: Secondary | ICD-10-CM

## 2022-10-12 DIAGNOSIS — R635 Abnormal weight gain: Secondary | ICD-10-CM

## 2022-10-12 DIAGNOSIS — R7303 Prediabetes: Secondary | ICD-10-CM

## 2022-10-12 LAB — TSH: TSH: 3.06 u[IU]/mL (ref 0.27–4.20)

## 2022-10-12 NOTE — H&P (Signed)
History and Physical    HISTORY:  Chief Complaint   Patient presents with    Annual Exam         History of Present Illness:    This is a 62 year-old male who presents for a physical  PMH significant for a.fib, HTN, gout, nephrolithiasis, pyelonephritis, arthritis       Overall doing well-  He and his wife spend a lot of time in [place] u[facility] and ID following epididymitis, UTI  He has various specialists in [place] as well as [employer].  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 severe diverticulosis. Dr Lennon Alstrom    FOREARM FRACTURE SURGERY Left     HIP REPLACEMENT Right 10/2019    in [facility]    INGUINAL HERNIA REPAIR Right 07/2017    INGUINAL HERNIA REPAIR Left 2012    LITHOTRIPSY  2021    x2    PR ARTHRP ACETBLR/PROX FEM PROSTC AGRFT/ALGRFT Left 09/09/2021    Procedure: LEFT ARTHROPLASTY, HIP, TOTAL, ANTERIOR APPROACH;  Surgeon: Zella Ball, MD;  Location: [facility] MAIN [facility];  Service: Orthopedics    UMBILICAL HERNIA REPAIR  07/2018    incisional hernia repaired         Allergies:    Allergies   Allergen Reactions    Demerol Hcl [Meperidine] Nausea And Vomiting       Current medications:     Current Outpatient Medications   Medication Sig    allopurinol (ZYLOPRIM) 300 mg tablet TAKE 1 TABLET BY MOUTH EVERY DAY    tamsulosin (FLOMAX) 0.4 mg capsule Take 1 capsule (0.4 mg total) by mouth daily.    tadalafil (CIALIS) 5 MG tablet TAKE 1 TABLET (5 MG TOTAL) BY MOUTH DAILY.    TIADYLT ER 120 MG 24 hr capsule TAKE 1 CAPSULE (120 MG TOTAL) BY MOUTH DAILY FOR HIGH BLOOD PRESSURE DISORDER    hydroCHLOROthiazide (HYDRODIURIL) 25 mg tablet TAKE 1 TABLET BY MOUTH EVERY DAY IN THE MORNING    potassium citrate (UROCIT-K) 10 mEq (1080 mg) CR tablet TAKE 1 TABLET (10 MEQ TOTAL) BY MOUTH DAILY FOR HX OF KIDNEY STONES    polyethylene glycol (GLYCOLAX,MIRALAX) 17 g powder packet Take 17 g by mouth daily for Constipation. Reconstitute in 4-8oz water    Cholecalciferol (VITAMIN D) 125 MCG (5000 UT) CAPS Take 1 capsule by mouth daily for Vitamin D  Deficiency    biotin 5 MG tablet Take 1 tablet (5 mg total) by mouth daily for Vitamin Deficiency.    ascorbic acid (VITAMIN C) 1,000 mg tablet Take 1 tablet (1,000 mg total) by mouth daily for promote bone healing.    Cranberry 4200 MG CAPS Take 1 capsule by mouth daily for urinary issues    Zinc 50 MG TABS Take 1 tablet by mouth daily for Zinc Deficiency    co-enzyme Q-10 100 MG oral solid Take 1 each (100 mg total) by mouth daily for Heart Rhythm Disorder.    Multiple Vitamin (MULTIVITAMIN PO) Take 1 tablet by mouth daily for vitamin supplement       Family History:    Family History   Adopted: Yes   Problem Relation Age of Onset    Anesthesia problems Neg Hx        Social/Occupational History:   Social History     Socioeconomic History    Marital status: Married   Tobacco Use    Smoking status: Never    Smokeless tobacco: Never   Substance and Sexual Activity    Alcohol use: Not Currently    Drug use: Never    Sexual activity: Yes     Partners: Female     Comment: monogamous   Social History Narrative    Splits time between [place] and [place].    Lives with wife, Sofie Rower.     3 kids and 2 granddaughters.    Business Conservation officer, historic buildings.         Review of Systems:    Review of Systems   Constitutional:  Negative for malaise/fatigue and weight loss.   HENT:  Positive for hearing loss.    Eyes:  Negative for blurred vision.   Respiratory:  Negative for shortness of breath.    Cardiovascular:  Negative for chest pain and palpitations.   Gastrointestinal:  Negative for constipation, diarrhea and heartburn.   Musculoskeletal:  Negative for back pain and joint pain.   Neurological:  Negative for dizziness and headaches.   Endo/Heme/Allergies:  Negative for environmental allergies.   Psychiatric/Behavioral:  Negative for depression. The patient is not nervous/anxious.        Vital Signs:   BP 128/84   Pulse 84   Temp 36.6 C (97.9 F)   Ht 1.87 m (6' 1.62")   Wt 136.1 kg (300 lb 1.6 oz)   SpO2 95%   BMI 38.93 kg/m       PHYSICAL EXAM:  Physical Exam  Vitals reviewed.   Constitutional:       General: He is not in acute distress.     Appearance: Normal appearance.   HENT:      Head: Normocephalic and atraumatic.      Right Ear: Tympanic membrane, ear canal and external ear normal.      Left Ear: Tympanic membrane, ear canal and external ear normal.      Mouth/Throat:      Pharynx: Oropharynx is clear.   Neck:      Thyroid: No thyromegaly.   Cardiovascular:      Rate and Rhythm: Normal rate and regular rhythm.      Heart sounds: Normal heart sounds.   Pulmonary:      Effort: Pulmonary effort is normal.      Breath sounds: Normal breath sounds.   Abdominal:      General: Bowel sounds are normal. There is no distension.  Palpations: Abdomen is soft.      Tenderness: There is no abdominal tenderness.   Musculoskeletal:         General: Normal range of motion.      Cervical back: Normal range of motion.   Lymphadenopathy:      Cervical: No cervical adenopathy.      Upper Body:      Right upper body: No supraclavicular adenopathy.      Left upper  body: No supraclavicular adenopathy.   Skin:     General: Skin is warm and dry.   Neurological:      Mental Status: He is alert and oriented to person, place, and time.   Psychiatric:         Mood and Affect: Mood normal.           Lab results: 10/08/22  0827   WBC 7.8   Hemoglobin 16.1   Hematocrit 47   RBC 5.1   Platelets 228         Lab results: 10/08/22  0827   Sodium 140   Potassium 4.4   Chloride 102   CO2 25   UN 29*   Creatinine 1.26*   Glucose 122*   Calcium 9.9   Total Protein 7.0   Albumin 4.7   ALT 30   AST 27   Alk Phos 104   Bilirubin,Total 0.6     Lab Results   Component Value Date    HA1C 5.7 (H) 10/08/2022     Lab Results   Component Value Date    PSAR3 2.91 10/08/2022    PSAR3 2.82 05/19/2021    PSAR3 2.76 10/03/2020         Lab results: 10/08/22  0827   Cholesterol 161   HDL 33*   LDL Calculated 104   Triglycerides 121   Chol/HDL Ratio 4.9     No components found with this basename: "[employer]"    The 10-year ASCVD risk score (Arnett DK, et al., 2019) is: 11.1%    Values used to calculate the score:      Age: 60 years      Sex: Male      Is Non-Hispanic African American: No      Diabetic: No      Tobacco smoker: No      Systolic Blood Pressure: 128 mmHg      Is BP treated: Yes      HDL Cholesterol: 33 mg/dL      Total Cholesterol: 161 mg/dL      Assessment / Plan:        Health care maintenance  Patient presents for a CPE w/ concerns as below  Health maintenance and immunizations reviewed  Fasting blood work completed prior to visit and reviewed in detail  Discussed diet, exercise, seatbelts, sunscreen, dental care    Primary hypertension  BP is not at goal of <=130/80.  Potassium was last checked on 10/08/2022 and was 4.74mmol/L, which is within acceptable limits. Creatinine was last checked on 10/08/2022 and was 1.26mg /dL, which needs to be rechecked.  Diastolic mildly elevated today.  No medication changes made.    Medication regimen as of TODAY is as follows:   Antihypertensive Medications                TIADYLT ER 120 MG 24 hr capsule (Taking) TAKE 1 CAPSULE (120 MG TOTAL) BY MOUTH DAILY FOR HIGH BLOOD PRESSURE DISORDER    hydroCHLOROthiazide (HYDRODIURIL) 25 mg tablet (Taking)  TAKE 1 TABLET BY MOUTH EVERY DAY IN THE MORNING          Elevated serum creatinine  Creatinine mildly elevated, normal GFR.  He does not take any NSAIDs.  Discussed importance of adequate hydration.  Recheck BMP in a couple weeks.    -     Basic metabolic panel; Future    Prediabetes  A1c just meets criteria for prediabetes.  Encouraged low carbohydrate/sugar diet, regular exercise.    Weight gain  Added TSH onto previously drawn blood work.  Continue healthy, well-balanced diet and regular exercise.    -     TSH; Future      Follow up 1 year/PRN   .

## 2022-10-19 ENCOUNTER — Encounter: Payer: Self-pay | Admitting: Family Medicine

## 2022-10-21 ENCOUNTER — Other Ambulatory Visit
Admission: RE | Admit: 2022-10-21 | Discharge: 2022-10-21 | Disposition: A | Discharge status: Home / Self Care | Payer: BLUE CROSS/BLUE SHIELD | Source: Ambulatory Visit | Attending: Family Medicine | Admitting: Family Medicine

## 2022-10-21 DIAGNOSIS — E669 Obesity, unspecified: Secondary | ICD-10-CM | POA: Insufficient documentation

## 2022-10-21 DIAGNOSIS — Z0000 Encounter for general adult medical examination without abnormal findings: Secondary | ICD-10-CM | POA: Insufficient documentation

## 2022-10-21 DIAGNOSIS — R7989 Other specified abnormal findings of blood chemistry: Secondary | ICD-10-CM | POA: Insufficient documentation

## 2022-10-21 LAB — BASIC METABOLIC PANEL
Anion Gap: 14 (ref 7–16)
CO2: 22 mmol/L (ref 20–28)
Calcium: 10.2 mg/dL (ref 8.6–10.2)
Chloride: 102 mmol/L (ref 96–108)
Creatinine: 1.18 mg/dL — ABNORMAL HIGH (ref 0.67–1.17)
Glucose: 125 mg/dL — ABNORMAL HIGH (ref 60–99)
Lab: 31 mg/dL — ABNORMAL HIGH (ref 6–20)
Potassium: 4.1 mmol/L (ref 3.3–5.1)
Sodium: 138 mmol/L (ref 133–145)
eGFR BY CREAT: 71 *

## 2022-10-21 LAB — TESTOSTERONE, FREE BY IMMUNOASSAY (ADULT MALES OR INDIVIDUALS ON TESTOSTERONE HORMONE THERAPY)
Testosterone,% Free: 2 %
Testosterone,Free: 77 pg/mL (ref 47–244)
Testosterone: 337 ng/dL (ref 193–740)

## 2022-10-21 LAB — SEX HORMONE BINDING GLOBULIN: Sex Hormone Binding Glob: 24 nmol/L (ref 10–80)

## 2022-10-21 MED ORDER — TIRZEPATIDE-WEIGHT MANAGEMENT 2.5 MG/0.5ML SC SOAJ *A*
2.5000 mg | SUBCUTANEOUS | 0 refills | Status: DC
Start: 2022-10-21 — End: 2022-11-17

## 2022-10-21 NOTE — Addendum Note (Signed)
Addended by: Delanna Ahmadi R on: 10/21/2022 10:09 PM     Modules accepted: Orders

## 2022-10-22 NOTE — Telephone Encounter (Signed)
Duplicate on PA for Zepbound received, already approved by PA team

## 2022-10-22 NOTE — Progress Notes (Signed)
Zepbound 2.5MG /0.5ML pen-injectors were approved through 5.19.25

## 2022-10-25 ENCOUNTER — Encounter: Payer: Self-pay | Admitting: Family Medicine

## 2022-11-02 ENCOUNTER — Encounter: Payer: Self-pay | Admitting: Family Medicine

## 2022-11-02 DIAGNOSIS — N400 Enlarged prostate without lower urinary tract symptoms: Secondary | ICD-10-CM

## 2022-11-02 MED ORDER — TAMSULOSIN HCL 0.4 MG PO CAPS *I*
0.4000 mg | ORAL_CAPSULE | Freq: Every day | ORAL | 1 refills | Status: DC
Start: 2022-11-02 — End: 2023-04-28

## 2022-11-02 NOTE — Telephone Encounter (Signed)
Last office visit with doctor:   04/13/2022  Last office visit with APP:   10/12/2022  Patients upcoming appointments:  Future Appointments   Date Time Provider Department Center   10/13/2023 10:15 AM [name], MD [facility] None     Recent Lab results:  GENERAL CHEMISTRY   Recent Labs     10/21/22  0801 10/08/22  0827 05/01/22  0906   NA 138 140 138   K 4.1 4.4 4.5   CL 102 102 101   CO2 22 25 24    GAP 14 13 13    UN 31* 29* 22*   CREAT 1.18* 1.26* 1.01   GLU 125* 122* 106*   CA 10.2 9.9 9.7   URIC  --  6.0  --       LIPID PROFILE   Recent Labs     10/08/22  0827   CHOL 161   TRIG 121   HDL 33*   LDLC 104      LIVER PROFILE   Recent Labs     10/08/22  0827 05/01/22  0906 04/19/22  0143   ALT 30 37 48   AST 27 33 39   ALK 104 119 114   TB 0.6 0.6 0.4      DIABETES THYROID   Recent Labs     10/08/22  0827   HA1C 5.7*    Recent Labs     10/08/22  0827   TSH 3.06         Pending/Orders Labs:  Lab Frequency Next Occurrence   US Scrotum & Contents with Testicular Doppler Complete Once 05/26/2022   CT Urogram without and with IV contrast Once 05/26/2022   TSH Once 10/12/2022   Lactate, plasma (CONDITIONAL) ONE TIME

## 2022-11-16 ENCOUNTER — Encounter: Payer: Self-pay | Admitting: Family Medicine

## 2022-11-16 ENCOUNTER — Other Ambulatory Visit: Payer: Self-pay | Admitting: Family Medicine

## 2022-11-16 DIAGNOSIS — E669 Obesity, unspecified: Secondary | ICD-10-CM

## 2022-11-16 DIAGNOSIS — I10 Essential (primary) hypertension: Secondary | ICD-10-CM

## 2022-11-16 NOTE — Telephone Encounter (Signed)
Last office visit with doctor:   04/13/2022  Last office visit with APP:   10/12/2022  Patients upcoming appointments:  Future Appointments   Date Time Provider Department Center   10/13/2023 10:15 AM [name], MD [facility] None     Recent Lab results:  GENERAL CHEMISTRY   Recent Labs     10/21/22  0801 10/08/22  0827 05/01/22  0906   NA 138 140 138   K 4.1 4.4 4.5   CL 102 102 101   CO2 22 25 24    GAP 14 13 13    UN 31* 29* 22*   CREAT 1.18* 1.26* 1.01   GLU 125* 122* 106*   CA 10.2 9.9 9.7   URIC  --  6.0  --       LIPID PROFILE   Recent Labs     10/08/22  0827   CHOL 161   TRIG 121   HDL 33*   LDLC 104      LIVER PROFILE   Recent Labs     10/08/22  0827 05/01/22  0906 04/19/22  0143   ALT 30 37 48   AST 27 33 39   ALK 104 119 114   TB 0.6 0.6 0.4      DIABETES THYROID   Recent Labs     10/08/22  0827   HA1C 5.7*    Recent Labs     10/08/22  0827   TSH 3.06         Pending/Orders Labs:  Lab Frequency Next Occurrence   US Scrotum & Contents with Testicular Doppler Complete Once 05/26/2022   CT Urogram without and with IV contrast Once 05/26/2022   TSH Once 10/12/2022   Lactate, plasma (CONDITIONAL) ONE TIME

## 2022-11-17 MED ORDER — TIRZEPATIDE-WEIGHT MANAGEMENT 5 MG/0.5ML SC SOAJ *I*
5.0000 mg | SUBCUTANEOUS | 2 refills | Status: DC
Start: 2022-11-17 — End: 2022-12-17

## 2022-12-15 ENCOUNTER — Encounter: Payer: Self-pay | Admitting: Family Medicine

## 2022-12-15 DIAGNOSIS — E669 Obesity, unspecified: Secondary | ICD-10-CM

## 2022-12-15 DIAGNOSIS — I10 Essential (primary) hypertension: Secondary | ICD-10-CM

## 2022-12-15 DIAGNOSIS — I480 Paroxysmal atrial fibrillation: Secondary | ICD-10-CM

## 2022-12-15 DIAGNOSIS — N451 Epididymitis: Secondary | ICD-10-CM

## 2022-12-15 NOTE — Telephone Encounter (Signed)
Dear Jorge Boyd,    The lab orders are pending approval with your provider.    Sincerely,     [name], MD's Team  [phone]

## 2022-12-15 NOTE — Telephone Encounter (Signed)
Pended.

## 2022-12-17 ENCOUNTER — Other Ambulatory Visit: Payer: Self-pay

## 2022-12-17 ENCOUNTER — Emergency Department: Payer: BLUE CROSS/BLUE SHIELD

## 2022-12-17 ENCOUNTER — Emergency Department
Admission: EM | Admit: 2022-12-17 | Discharge: 2022-12-17 | Disposition: A | Discharge status: Home / Self Care | Payer: BLUE CROSS/BLUE SHIELD | Source: Ambulatory Visit | Attending: Student in an Organized Health Care Education/Training Program | Admitting: Student in an Organized Health Care Education/Training Program

## 2022-12-17 DIAGNOSIS — M47816 Spondylosis without myelopathy or radiculopathy, lumbar region: Secondary | ICD-10-CM

## 2022-12-17 DIAGNOSIS — S199XXA Unspecified injury of neck, initial encounter: Secondary | ICD-10-CM

## 2022-12-17 DIAGNOSIS — S4991XA Unspecified injury of right shoulder and upper arm, initial encounter: Secondary | ICD-10-CM

## 2022-12-17 DIAGNOSIS — N289 Disorder of kidney and ureter, unspecified: Secondary | ICD-10-CM

## 2022-12-17 DIAGNOSIS — Y9289 Other specified places as the place of occurrence of the external cause: Secondary | ICD-10-CM | POA: Insufficient documentation

## 2022-12-17 DIAGNOSIS — Y998 Other external cause status: Secondary | ICD-10-CM | POA: Insufficient documentation

## 2022-12-17 DIAGNOSIS — M25531 Pain in right wrist: Secondary | ICD-10-CM | POA: Insufficient documentation

## 2022-12-17 DIAGNOSIS — Y9389 Activity, other specified: Secondary | ICD-10-CM | POA: Insufficient documentation

## 2022-12-17 DIAGNOSIS — N200 Calculus of kidney: Secondary | ICD-10-CM

## 2022-12-17 DIAGNOSIS — S299XXA Unspecified injury of thorax, initial encounter: Secondary | ICD-10-CM

## 2022-12-17 DIAGNOSIS — G8911 Acute pain due to trauma: Secondary | ICD-10-CM

## 2022-12-17 DIAGNOSIS — M25511 Pain in right shoulder: Secondary | ICD-10-CM | POA: Insufficient documentation

## 2022-12-17 DIAGNOSIS — S20211A Contusion of right front wall of thorax, initial encounter: Secondary | ICD-10-CM | POA: Insufficient documentation

## 2022-12-17 DIAGNOSIS — S3992XA Unspecified injury of lower back, initial encounter: Secondary | ICD-10-CM

## 2022-12-17 DIAGNOSIS — R0781 Pleurodynia: Secondary | ICD-10-CM

## 2022-12-17 DIAGNOSIS — M25512 Pain in left shoulder: Secondary | ICD-10-CM

## 2022-12-17 DIAGNOSIS — S2241XA Multiple fractures of ribs, right side, initial encounter for closed fracture: Secondary | ICD-10-CM | POA: Insufficient documentation

## 2022-12-17 DIAGNOSIS — W11XXXA Fall on and from ladder, initial encounter: Secondary | ICD-10-CM | POA: Insufficient documentation

## 2022-12-17 DIAGNOSIS — S0990XA Unspecified injury of head, initial encounter: Secondary | ICD-10-CM

## 2022-12-17 DIAGNOSIS — S7001XA Contusion of right hip, initial encounter: Secondary | ICD-10-CM | POA: Insufficient documentation

## 2022-12-17 DIAGNOSIS — N281 Cyst of kidney, acquired: Secondary | ICD-10-CM

## 2022-12-17 DIAGNOSIS — S300XXA Contusion of lower back and pelvis, initial encounter: Secondary | ICD-10-CM

## 2022-12-17 DIAGNOSIS — S6991XA Unspecified injury of right wrist, hand and finger(s), initial encounter: Secondary | ICD-10-CM

## 2022-12-17 MED ORDER — IOHEXOL 350 MG/ML (OMNIPAQUE) IV SOLN *I*
1.0000 mL | Freq: Once | INTRAVENOUS | Status: AC
Start: 2022-12-17 — End: 2022-12-17
  Administered 2022-12-17: 149 mL via INTRAVENOUS

## 2022-12-17 MED ORDER — OXYCODONE HCL 5 MG PO TABS *I*
5.0000 mg | ORAL_TABLET | Freq: Once | ORAL | Status: AC
Start: 2022-12-17 — End: 2022-12-17
  Administered 2022-12-17: 5 mg via ORAL
  Filled 2022-12-17: qty 1

## 2022-12-17 MED ORDER — TIRZEPATIDE-WEIGHT MANAGEMENT 5 MG/0.5ML SC SOAJ *I*
5.0000 mg | SUBCUTANEOUS | 2 refills | Status: DC
Start: 2022-12-17 — End: 2023-01-13

## 2022-12-17 MED ORDER — HYDROMORPHONE HCL PF 1 MG/ML IJ SOLN *WRAPPED*
1.0000 mg | Freq: Once | INTRAMUSCULAR | Status: AC
Start: 2022-12-17 — End: 2022-12-17
  Administered 2022-12-17: 1 mg via INTRAVENOUS
  Filled 2022-12-17: qty 1

## 2022-12-17 MED ORDER — LIDOCAINE 5 % EX PTCH *I*
1.0000 | MEDICATED_PATCH | CUTANEOUS | 0 refills | Status: DC
Start: 2022-12-17 — End: 2022-12-23

## 2022-12-17 MED ORDER — OXYCODONE-ACETAMINOPHEN 5-325 MG PO TABS *I*
1.0000 | ORAL_TABLET | Freq: Four times a day (QID) | ORAL | 0 refills | Status: AC | PRN
Start: 2022-12-17 — End: 2022-12-22

## 2022-12-17 MED ORDER — IOHEXOL 350 MG/ML (OMNIPAQUE) IV SOLN 500ML BOTTLE *I*
1.0000 mL | Freq: Once | INTRAVENOUS | Status: DC
Start: 2022-12-17 — End: 2022-12-18

## 2022-12-17 NOTE — Telephone Encounter (Signed)
Dr.Schwartz which dose does he need cue'd?

## 2022-12-17 NOTE — ED Provider Notes (Addendum)
History     Chief Complaint   Patient presents with    Jorge Boyd is a 62 year old male with previous medical history of hypertension, nephrolithiasis, atrial fibrillation in the setting of sepsis after UTI presenting with right shoulder and rib pain after a 10 foot fall.    Patient was using a ladder when he reached too far and, attempting to rebalance, fell off the ladder. Larey Seat about 10 feet to the ground. Patient is adamant he did not feel dizzy or lightheaded when it happened. He is also adamant he did not hit his head or neck. No LOC. He has pain in his right shoulder and right lower ribs. Denies pain in neck, back, abdomen, pelvis, legs. No numbness or weakness. Not on blood thinners. No drug/alcohol use.     His wife was home at the time and called 911. Patient has been in a C-collar since presentation to the hospital.       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 severe diverticulosis. Dr Lennon Alstrom    FOREARM FRACTURE SURGERY Left     HIP REPLACEMENT Right 10/2019    in [facility]    INGUINAL HERNIA REPAIR Right 07/2017    INGUINAL HERNIA REPAIR Left 2012    LITHOTRIPSY  2021    x2    PR ARTHRP ACETBLR/PROX FEM PROSTC AGRFT/ALGRFT Left  09/09/2021    Procedure: LEFT ARTHROPLASTY, HIP, TOTAL, ANTERIOR APPROACH;  Surgeon: Zella Ball, MD;  Location: [facility] MAIN OR;  Service: Orthopedics    UMBILICAL HERNIA REPAIR  07/2018    incisional hernia repaired          Social History     Tobacco Use    Smoking status: Never    Smokeless tobacco: Never   Substance Use Topics    Alcohol use: Not Currently    Drug use: Never             Review of Systems    Physical Exam     Triage Vitals  Triage Start: Start, (12/17/22 1231)  First Recorded BP: 136/90, Resp: 19, Temp: 36.6 C (97.9 F) Oxygen Therapy SpO2: 97 %, O2 Device: None (Room air), Heart Rate: 89, (12/17/22 1232)  .  First Pain Reported  0-10 Scale: 5, (12/17/22 1232)       Physical Exam  Vitals and  nursing note reviewed.   Constitutional:       General: He is not in acute distress.     Appearance: He is not ill-appearing, toxic-appearing or diaphoretic.   HENT:      Head: Normocephalic and atraumatic.      Nose: Nose normal.   Eyes:      Extraocular Movements: Extraocular movements intact.   Neck:      Comments: C-collar in place. No midline cervical spinal tenderness   Cardiovascular:      Rate and Rhythm: Normal rate and regular rhythm.   Pulmonary:      Effort: Pulmonary effort is normal.      Breath sounds: Normal breath sounds.   Chest:      Chest wall: Tenderness (right lower ribs) present.   Abdominal:      General: Abdomen is flat. There is no distension.      Palpations: Abdomen is soft.      Tenderness: There is abdominal tenderness (right flank). There is no guarding.   Musculoskeletal:      Comments: Severely limited range of motion in right shoulder due to pain. Tenderness to palpation over right shoulder and right wrist. Full range of motion with pain on right wrist.  Normal range of motion without pain in lower extremities and fingers.  No tenderness midline thoracic or lumbar spine    Skin:     General: Skin is warm and dry.   Neurological:      General: No focal deficit present.       Mental Status: He is alert and oriented to person, place, and time.      Cranial Nerves: No cranial nerve deficit.      Sensory: No sensory deficit.      Motor: No weakness.   Psychiatric:         Mood and Affect: Mood normal.         Behavior: Behavior normal.         Thought Content: Thought content normal.         Judgment: Judgment normal.         Medical Decision Making     Assessment:  [name] is a 62 year old male with a history of hypertension, nephrolithaiasis with frequent UTIs and a history of sepsis with atrial fibrillation that presented after a fall from a 10 foot ladder.    Concern for rib fractures, shoulder/wrist injury. Possible intraabdominal injury given tenderness over right side/flank. Will obtain CT chest, abdomen, pelvis, and xrays of right shoulder and wrist. Lower suspicion for intracranial or neck injury, but given mechanism of injury and presence of distracting injury with right shoulder/side, will obtain CT head and cervical spine to further evaluate. Will provide analgesia.     Differential diagnosis:  Traumatic injuries to bilateral shoulders, right wrist  Potential rib injury with possible but unlikely liver injury  Unlikely to have head or neck injury    Plan:  CT head  CT c spine  CT abdomen and pelvis  Right shoulder XR  Right wrist XR    ED Course and Disposition:  Signed out to oncoming team pending imaging, reassessment.            Leandra Kern, PhD  Medical student (MS4)        Student Attestation:    The student was personally supervised by me during the patient examination on 12/17/2022. I personally saw and evaluated the patient, provided the medical decision-making, and  reviewed and verified the key elements of the student documentation. I have edited the student's note and confirm the findings and plan of care as documented.    Author:  Darcey Nora, MD         Dolan Amen  12/17/22 1414       Irisa Grimsley, Faye Ramsay, MD  12/19/22 [phone]

## 2022-12-17 NOTE — Discharge Instructions (Addendum)
You were seen in the [facility] Emergency Department for your traumatic fall.  Your physical exam and imaging were reassuring that you are only suffering from a set of broken ribs.     We recommend you follow up with your primary care physician if your pain worsens.      Please follow-up with your primary care physician or our emergency department if you develop any of the following symptoms:  Fever, chills, difficulty breathing, increased pain that does not resolve with the medications provided.

## 2022-12-17 NOTE — ED Triage Notes (Signed)
Pt fell approximately 58ft off of a ladder, landed onto concrete. Denies hitting head, LOC, neck/back pain, numbness/tingling. Denies anticoagulation. Arrives in c-collar. Endorses R shoulder pain, CMS intact. Endorsing R rib pain. Charise Carwin, MD out to evaluate, no level called at this time.     Prehospital medications given: Yes  Analgesia: Tylenol (1000mg  IV)

## 2022-12-17 NOTE — ED Provider Progress Notes (Addendum)
TRANSFER OF CARE NOTE    Assuming care of this patient in sign out from the day service. Briefly this is a 62 y.o. male with history of HTN, Afib no AC, who presents with traumatic fall, 57ft from ladder to R side of body. Pain present overlying R lower ribs, R shoulder.   - Workup pending still.   - Treatment received thusfar includes analgesia with 5mg  oxycodone    Pending workup - Imaging  Dispo - imaging dependent      ED Course as of 12/17/22 2041   [facility] Dec 17, 2022   1538 Patient received 5mg  oxycodone already, given continued pain during check in we will provide with another dose at this time   1836 * Wrist RIGHT standard PA, Lateral, and both Oblique views  Impression    No acute fracture or dislocation.     1836 CT chest with IV contrast  FINDINGS:      Neck base:  Normal.    Mediastinum and lymph nodes: No lymphadenopathy by size criteria. Probable small hiatal hernia. No mediastinal hematoma.    Cardiovascular structures: No thoracic aortic dissection or periaortic hematoma. Arch origin of the left vertebral artery, anatomic variant. Mild coronary artery calcifications.    Pleura and Pleural space:  Normal.      Airways (Trachea/Bronchi):  The trachea and bronchi are normal.      Lungs: Mild bilateral dependent atelectasis and hypoventilatory change. No consolidations or suspicious pulmonary nodules.    Upper abdomen: Please see separately dictated CT abdomen and pelvis imaging report for detailed findings below the level of the diaphragm.    Soft Tissues/Musculoskeletal: Acute right posterior ninth and 10th rib fractures with minimal displacement..   Impression    Minimally displaced acute posterior ninth and 10th rib fractures.    No acute other acute intrathoracic trauma appreciated.     1836 CT abdomen and pelvis with IV contrast  Impression    1.  There are nonspecific small thin bands of material of indeterminate age in the fat posterior to the SMA and SMV medial to the pancreatic uncinate process  at and to the left of midline, for example on image [id number].      Differential diagnosis includes small bands of hematoma in the epigastric area versus other etiology, such as areas of postinflammatory change of indeterminate age.    Recommend clinical correlation for associated symptoms in this area and with pancreatic enzymes.    2. There are 2 fractures of the posterolateral right ninth rib and one in the right 10th rib.    3. Small area measuring at least 8.4 x 6.6 cm in the subcutaneous fat of the right posterolateral buttock that is most consistent with an area of soft tissue hematoma that extends just beyond the field-of-view. Recommend clinical correlation.    4. Multiple renal calculi, as above.    5. Renal cysts are present as well as an indeterminate 2 x 1.2 cm anterior mid right renal lesion with an overlying focus of atrophy or scar.    Artifacts limit characterization of this mid right renal lesion, could be a high density cyst or solid renal lesion. Recommend a nonemergent follow-up right renal ultrasound to further characterize this finding.    6. Splenomegaly, correlate clinically for possible causes.    7. Other findings: Lumbar spondylosis.    Also see same-day Chest CT and its separate report.    END OF IMPRESSION     1837 CT cervical spine  without contrast  Impression    No acute fracture of the cervical spine. If clinical concern persists, recommend MRI for further evaluation.    Multilevel spondylotic changes in the cervical spine with moderate to severe spinal canal stenosis at C6-C7.    Please refer to separately dictated CT head report for additional information.    END OF IMPRESSION     1837 CT head without contrast for TRAUMA  Impression    No CT evidence of acute intracranial abnormality.    Additional information is detailed above.    END OF IMPRESSION     1837 Imaging back, patient has multiple Rib Frx, with underlying hematoma, will contact trauma for eval   2027 Discussed with trauma  team that have cleared him for discharge at this time. Will update patient and ready him for discharge.        Marvetta Gibbons, MD  Emergency Medicine PGY-1  12/17/22 8:41 PM       Jorge Boyd is a 62 y.o. male fell 10 feet off a ladder onto right side with R shoulder and lower rib pain with abdominal tenderness. Getting pan scanned, as well as plain films of R shoulder and wrist.    []  imaging     Marvetta Gibbons, MD  Resident  12/17/22 1512       Marvetta Gibbons, MD  Resident  12/17/22 [phone]

## 2022-12-17 NOTE — ED Notes (Signed)
REPORT GIVEN TO MARIA RN

## 2022-12-17 NOTE — First Provider Contact (Signed)
ED First Provider Contact Note    Initial provider evaluation performed by me on 11/15 at 1245    Vital signs reviewed.    Assessment: Patient with fall approximately 10 feet from ladder, landing onto pavement. Unsure if head strike, caught self with right arm and side. Subsequent pain to the right shoulder, neck, and chest wall. No LOC. No headache. No A/C. On brief exam, patient with intact airway, bilateral breath sounds, and no evidence of acute focal neuro deficits. Some limitation with abduction at right shoulder, and tenderness to palpation on exam. Otherwise neurovascularly intact.     Orders placed:  CT head, c-spine, chest  XR R shoulder     Patient requires further evaluation.     Joellen Jersey, MD, 12/17/2022, 12:49 PM     Joellen Jersey, MD  Resident  12/17/22 [phone]

## 2022-12-17 NOTE — Provider Consult (Addendum)
[facility] TRAUMA [facility]ENTER  Initial Patient Evaluation  Consult    Mode of Arrival: Ambulance    Team  Attending: Curlene Labrum  Senior Resident: Eustaquio Maize Evaluator (Resident/APP): Ina Homes    Time of Trauma Activation/Consult: 1841  Time of Trauma Team Response: 1905  Time Trauma Attending Notified: 1930   attending notification:    []  verbal   [x]  in person    History  Mechanism of Injury: Fall of ladder 10-12 feet      Pre-Hospital Treatment (check all applicable):  []  PRBC  []  FFP  []  Whole Blood  []  TXA  []  Hypotensive (SBP < 90)  []  Intubated      HPI  Kennard Johannsen is a 62 y.o. male with a history of HTN, A-fib not on [facility] who presented as a trauma consult after a fall off a ladder (~10-12 feet). He landed on his right side, and now is primarily complaining of R shoulder pain. Also reports some pain in his R hip and mild pain in his right ribs. No abdominal pain, no chest pain, no head pain, no neck pain, no extremity pain. No LOC.      Primary Survey  HR: 89  BP: 136/90      RR: 19    Temp: 36.6 C   O2 Sat: 97%    Airway: Intact    Breathing: Intact    Circulation: Pulse present - Yes    Disability:   Eye (4) - 4  Verbal (5) - 5  Motor (6) - 6  Total GCS (15): 15  Lateralizing signs present? No  Pupil exam: Trauma Pupil Exam: Equal, round, reactive to light and accomodation      Secondary Survey  Physical Exam  Physical Exam  Vitals and nursing note reviewed.   Constitutional:       General: He is not in acute distress.     Appearance: Normal appearance. He is normal weight. He is not ill-appearing.   HENT:      Head: Normocephalic and atraumatic.   Cardiovascular:      Rate and Rhythm: Normal rate and regular rhythm.   Pulmonary:      Effort: Pulmonary effort is normal. No respiratory distress.      Breath sounds: No wheezing.   Abdominal:      General: There is no distension.      Tenderness: There is no abdominal tenderness. There is no guarding or rebound.   Musculoskeletal:      Cervical back: Normal  range of motion and neck supple. No tenderness.      Comments: Tenderness over the right shoulder diffusely. Mild tenderness over the right lower/lateral ribs. Mild tenderness over right hip, with evidence of small hematoma. No thoracic or lumbar spine tenderness to palpation.    Skin:     General: Skin is warm and dry.   Neurological:      General: No focal deficit present.      Mental Status: He is alert and oriented to person, place, and time.   Psychiatric:         Mood and Affect: Mood normal.         Behavior: Behavior normal.           Trauma Bay Treatment (check all applicable):  []  PRBC - # of units: -  []  FFP - # of units: -  []  Whole Blood - # of units: -  []  TXA  []  Tdap  []  Antibiotics  []   Hypotensive (SBP < 90)  []  Intubated    Was patient able to provide additional history? Yes  History obtained from: Patient  Allergies: Demerol HCl  Past Surgical History:   Past Surgical History:   Procedure Laterality Date    ANKLE FRACTURE SURGERY Left     COLONOSCOPY  11/06/2021    2 polys (tubular adenoma). severe diverticulosis. Dr Lennon Alstrom    FOREARM FRACTURE SURGERY Left     HIP REPLACEMENT Right 10/2019    in [facility]    INGUINAL HERNIA REPAIR Right 07/2017    INGUINAL HERNIA REPAIR Left 2012    LITHOTRIPSY  2021    x2    PR ARTHRP ACETBLR/PROX FEM PROSTC AGRFT/ALGRFT Left 09/09/2021    Procedure: LEFT ARTHROPLASTY, HIP, TOTAL, ANTERIOR APPROACH;  Surgeon: Zella Ball, MD;  Location: [facility] MAIN OR;  Service: Orthopedics    UMBILICAL HERNIA REPAIR  07/2018    incisional hernia repaired      Past Medical History:   Past Medical History:   Diagnosis Date    Atrial fibrillation     COVID-19 02/2019    Diverticulitis     Gout     Nephrolithiasis     Pyelonephritis 12/2018    ESBL E coli    Sepsis 12/2018    due to pyelonephritis     Shingles 2016     Social History:  Smoking: Never  Alcohol use: Not daily  Illicit drug use: No    Current Medications:   Current Outpatient Medications   Medication Sig     oxyCODONE-acetaminophen (PERCOCET) 5-325 mg per tablet Take 1 tablet by mouth every 6 hours as needed for Pain for up to 5 days. Max daily dose: 4 tablets    tadalafil (CIALIS) 5 MG tablet TAKE 1 TABLET (5 MG TOTAL) BY MOUTH DAILY.    tirzepatide for weight management, 5mg /dose, (ZEPBOUND) 5 mg/0.37mL pen Inject 0.5 mLs (5 mg total) into the skin once a week.    tamsulosin (FLOMAX) 0.4 mg capsule Take 1 capsule (0.4 mg total) by mouth daily.    allopurinol (ZYLOPRIM) 300 mg tablet TAKE 1 TABLET BY MOUTH EVERY DAY    TIADYLT ER 120 MG 24 hr capsule TAKE 1 CAPSULE (120 MG TOTAL) BY MOUTH DAILY FOR HIGH BLOOD PRESSURE DISORDER    hydroCHLOROthiazide (HYDRODIURIL) 25 mg tablet TAKE 1 TABLET BY MOUTH EVERY DAY IN THE MORNING    potassium citrate (UROCIT-K) 10 mEq (1080 mg) CR tablet TAKE 1 TABLET (10 MEQ TOTAL) BY MOUTH DAILY FOR HX OF KIDNEY STONES    polyethylene glycol (GLYCOLAX,MIRALAX) 17 g powder packet Take 17 g by mouth daily for Constipation. Reconstitute in 4-8oz water    Cholecalciferol (VITAMIN D) 125 MCG (5000 UT) CAPS Take 1 capsule by mouth daily for Vitamin D Deficiency    biotin 5 MG tablet Take 1 tablet (5 mg total) by mouth daily for Vitamin Deficiency.    ascorbic acid (VITAMIN C) 1,000 mg tablet Take 1 tablet (1,000 mg total) by mouth daily for promote bone healing.    Cranberry 4200 MG CAPS Take 1 capsule by mouth daily for urinary issues    Zinc 50 MG TABS Take 1 tablet by mouth daily for Zinc Deficiency    co-enzyme Q-10 100 MG oral solid Take 1 each (100 mg total) by mouth daily for Heart Rhythm Disorder.    Multiple Vitamin (MULTIVITAMIN PO) Take 1 tablet by mouth daily for vitamin supplement      Antiplatelet/Anticoagulant:  Yes  Agent:  ASA         Were above HPI elements able to be reviewed and updated during evaluation? Reviewed, confirmed up to date    Secondary Data  FAST Exam: NA  Labs ordered:   []  BMP []  CBC   []  INR  []  Type & Screen  []  TEG []  ABG  []  Other: -    Imaging  ordered:  [x]  CT Head  []  CT Maxillofacial  [x]  CT C-Spine []  CT Lumbar Spine  [x]  CT Chest   []  CT Thoracic Spine  [x]  CT Abd  []  CTA Neck  [x]  CT Pelvis   []  Other:  -    Summary  Burnet Alen is a 62 y.o. male with a history of HTN, A-fib not on AC (on ASA) who presented as a trauma consult after a fall off ladder ~10-12 feet. On arrival the patient was hemodynamically normal. Primary survey was intact. Initial GCS was 15. Secondary survey was notable for R shoulder pain, R hip tenderness, mild R lower rib tenderness.   Pt able to pull >2000 mL on incentive spirometry. Motivated to go home, and has good support system.     List of Injuries  - R 9-10th rib fractures with underlying hematoma.   - Right hip hematoma    Plan  Complete pan-scan with T-spine and L-spine imaging  Patient able to pull >2000 on IS, feels ok with pain control plan and discharge  Ok to discharge home from trauma perspective  Would send on tylenol, ibuprofen, lidocaine patches  Disposition: Home  Consultants: None  Solid return precautions (pain control, signs of infection/pneumonia)    Ina Homes, DO as of 8:50 PM, 12/17/2022     I saw and evaluated the patient. I agree with the findings and plan of care as documented above. I have made any adjustments to the documentation as necessary.    Otho Najjar, MD  PGY-4 General Surgery  12/17/2022 at: 9:03 PM

## 2022-12-18 NOTE — Telephone Encounter (Signed)
FYI He did go to the ED

## 2022-12-19 ENCOUNTER — Encounter: Payer: Self-pay | Admitting: Family Medicine

## 2022-12-20 ENCOUNTER — Ambulatory Visit: Payer: BLUE CROSS/BLUE SHIELD | Admitting: Family Medicine

## 2022-12-20 ENCOUNTER — Encounter: Payer: Self-pay | Admitting: Family Medicine

## 2022-12-20 ENCOUNTER — Other Ambulatory Visit: Payer: Self-pay

## 2022-12-20 VITALS — BP 112/68 | HR 78 | Temp 97.3°F | Ht 76.0 in | Wt 284.0 lb

## 2022-12-20 DIAGNOSIS — M25511 Pain in right shoulder: Secondary | ICD-10-CM

## 2022-12-20 DIAGNOSIS — Z23 Encounter for immunization: Secondary | ICD-10-CM

## 2022-12-20 DIAGNOSIS — R161 Splenomegaly, not elsewhere classified: Secondary | ICD-10-CM

## 2022-12-20 DIAGNOSIS — N289 Disorder of kidney and ureter, unspecified: Secondary | ICD-10-CM

## 2022-12-20 DIAGNOSIS — R935 Abnormal findings on diagnostic imaging of other abdominal regions, including retroperitoneum: Secondary | ICD-10-CM

## 2022-12-20 DIAGNOSIS — M25512 Pain in left shoulder: Secondary | ICD-10-CM

## 2022-12-20 NOTE — Progress Notes (Addendum)
 Chief Complaint   Patient presents with    Discharge Follow Up     Fall, shoulder pain         HPI: Jorge Boyd is a 62 y.o. man with a history of atrial fibrillation, hypertension, gout, kidney stones, pyelonephritis, and osteoarthritis, here for follow-up after ED visit for fall off ladder.    History of Present Illness  He is accompanied by his wife [name].    Three days ago, he fell 10-12 feet from a ladder, landing on his right side, injuring both shoulders (right more severe) and sustaining fractures to his 9th and 10th ribs, with bruising to other ribs. He reports right-sided back and rib pain, worsened by deep breaths, coughing, or movement, and severe right shoulder pain when lifting his right arm. Suspects a tear in his right shoulder due to bruising along right biceps. Manages pain with ice, lidocaine patches, and Percocet every 6 hours, alternating with ibuprofen. Reports low back bruising but no radiating pain, numbness, tingling, or weakness of legs. No upper abdominal pain, nausea, or vomiting. Denies neck injury from the fall.    History of multiple hernia surgeries and kidney stones. Reports sensation of another hernia in his stomach.   Takes Pepcid for heartburn and has made dietary changes.        CT spine 12/17/22:      No acute fracture of the cervical spine. If clinical concern persists, recommend MRI for further evaluation.      Multilevel spondylotic changes in the cervical spine with moderate to severe spinal canal stenosis at C6-C7.     CT head, thoracic spine, lumbar spine negative   Xrays of right wrist and right shoulder negative for fracture     CT chest      No acute fracture of the cervical spine. If clinical concern persists, recommend MRI for further evaluation.      Multilevel spondylotic changes in the cervical spine with moderate to severe spinal canal stenosis at C6-C7.       Preliminary read of CT a/p 12/17/22     1.  There are nonspecific small thin bands of material of indeterminate  age in the fat posterior to the SMA and SMV medial to the pancreatic uncinate process at and to the left of midline, for example on image [id number].        Differential diagnosis includes small bands of hematoma in the epigastric area versus other etiology, such as areas of postinflammatory change of indeterminate age.      Recommend clinical correlation for associated symptoms in this area and with pancreatic enzymes.      2. There are 2 fractures of the posterolateral right ninth rib and one in the right 10th rib.      3. Small area measuring at least 8.4 x 6.6 cm in the subcutaneous fat of the right posterolateral buttock that is most consistent with an area of soft tissue hematoma that extends just beyond the field-of-view. Recommend clinical correlation.      4. Multiple renal calculi, as above.      5. Renal cysts are present as well as an indeterminate 2 x 1.2 cm anterior mid right renal lesion with an overlying focus of atrophy or scar.      Artifacts limit characterization of this mid right renal lesion, could be a high density cyst or solid renal lesion. Recommend a nonemergent follow-up right renal ultrasound to further characterize this finding.      6. Splenomegaly,  correlate clinically for possible causes.      7. Other findings: Lumbar spondylosis.         Past medical history, problem list, medications and allergies personally reviewed in eRecord today.     BP 112/68   Pulse 78   Temp 36.3 C (97.3 F)   Ht 1.93 m (6\' 4" )   Wt 128.8 kg (284 lb)   SpO2 95%   BMI 34.57 kg/m   Physical Exam  Constitutional:       Appearance: Normal appearance.   Pulmonary:      Effort: Pulmonary effort is normal.      Breath sounds: Normal breath sounds.   Abdominal:      General: Bowel sounds are normal.      Palpations: Abdomen is soft.      Tenderness: There is no abdominal tenderness (epigastric).   Musculoskeletal:      Comments: Right shoulder: active abduction to 80 degrees, passive abduction to 130 degrees.  Limited internal and external rotation. +bruising along upper biceps. No tenderness to palpation along right clavicle. +tenderness over coracoid process, proximal humerus.   Left shoulder: active abduction to 160 degrees. Internal and external rotation mildly limited.   Neurological:      Mental Status: He is alert.       Assessment & Plan  Right shoulder pain:  Severe pain and limited range of motion in setting of fall off a ladder 3 days ago, onto his right shoulder.  Possible severe contusion or rotator cuff tear  Will evaluate with MRI.  Continue ice, ibuprofen 600 mg TID with food, Percocet until finished, then alternate Tylenol with the ibuprofen, lidocaine patch.    Left shoulder pain:  Chronic left shoulder pain continues to be an issue.  He saw Ortho in March 2023 and had an injection, which was not helpful.  He has continued to have chronic shoulder pain and left arm weakness which he notices when he is playing softball/batting. He has been working with a Psychologist, educational, but he is not seeing improvement. Shoulder pain has worsened since fall 3 days ago.  Suspect rotator cuff disorder.  Will evaluate with MRI.  - Continue ice, ibuprofen 600 mg TID with food, Percocet until finished, then alternate Tylenol and ibuprofen    Minimally displaced 9th and 10th rib fractures:  Continue pain management with ice, ibuprofen 600 mg TID with food, Percocet until finished, then alternate Tylenol and ibuprofen, lidocaine patch.    Right kidney lesion:  CT abdomen on 12/17/2022 shows a 2 x 1.2 cm anterior mid right renal lesion with overlying focus of atrophy or scar.  Follow-up renal ultrasound was recommended and this was ordered today.    Enlarged spleen:  CT abdomen on 12/17/2022 shows splenomegaly 16 cm. No LUQ pain or tenderness.   No known liver disease (liver was normal on imaging and LFTs September 2024 normal), no sign of vascular obstruction on recent CT imaging, normal CBC in September 2024 makes leukemia less  likely, no symptoms of infection.  Will recheck CBC differential and CMP now.   Will plan for follow-up ultrasound in a year to reassess splenomegaly.    Hiatal hernia:  Recent CT abdomen shows small hiatal hernia.  Discussed that this is likely contributing to his reflux.    - Continue dietary modifications for reflux and pepcid as needed.    Kidney stones:  - Small stones in both kidneys, largest 6 mm on right  - Consider follow-up imaging to  monitor  - Consider urology follow-up    CT a/p also showed "nonspecific small thin bands of material of indeterminate age in the fat posterior to the SMA and SMV medial to the pancreatic uncinate process at and to the left of midline, for example on image [id number].  Differential diagnosis includes small bands of hematoma in the epigastric area versus other etiology, such as areas of postinflammatory change of indeterminate age. Recommend clinical correlation for associated symptoms in this area and with pancreatic enzymes."  Patient is not having any upper abdominal pain or epigastric tenderness. Will check amylase and lipase.       Follow up: as needed

## 2022-12-20 NOTE — Patient Instructions (Signed)
2nd shingrix vaccine in 2-6 months

## 2022-12-23 MED ORDER — LIDOCAINE 5 % EX PTCH *I*
2.0000 | MEDICATED_PATCH | CUTANEOUS | 0 refills | Status: AC
Start: 2022-12-23 — End: 2023-01-22

## 2022-12-23 NOTE — Telephone Encounter (Signed)
Pended.

## 2022-12-24 ENCOUNTER — Telehealth: Payer: Self-pay

## 2022-12-24 NOTE — Telephone Encounter (Signed)
Called facility listed below - states Pt's imaging was denied by the supervisor due to not being covered - she will send an email out before leaving today to confirm with Prior auth dept - will make notations that PCP completed 11/18 note for review if it helps - Monday AM they should have a response via email and will let the patient know if this needs to be cancelled or allow him to have it done with knowing the potential out of pocket cost - PCP aware    [facility] Information    Phone Fax Address   [phone] Not available [address]  [facility] [place] [address]

## 2022-12-24 NOTE — Telephone Encounter (Addendum)
Received message that prior Berkley Harvey was denied for MRI.

## 2022-12-25 ENCOUNTER — Other Ambulatory Visit: Payer: Self-pay | Admitting: Registered Nurse

## 2022-12-26 NOTE — Telephone Encounter (Signed)
Last office visit with doctor:   12/20/2022  Last office visit with APP:   10/12/2022  Patients upcoming appointments:  Future Appointments   Date Time Provider Department Center   12/27/2022 12:00 PM RIS, MP MR1 [facility] [place]   12/27/2022 12:20 PM RIS, MP MR1 [facility] [place]   10/13/2023 10:15 AM [name], MD [facility] None     Recent Lab results:  GENERAL CHEMISTRY   Recent Labs     10/21/22  0801 10/08/22  0827 05/01/22  0906   NA 138 140 138   K 4.1 4.4 4.5   CL 102 102 101   CO2 22 25 24    GAP 14 13 13    UN 31* 29* 22*   CREAT 1.18* 1.26* 1.01   GLU 125* 122* 106*   CA 10.2 9.9 9.7   URIC  --  6.0  --       LIPID PROFILE   Recent Labs     10/08/22  0827   CHOL 161   TRIG 121   HDL 33*   LDLC 104      LIVER PROFILE   Recent Labs     10/08/22  0827 05/01/22  0906 04/19/22  0143   ALT 30 37 48   AST 27 33 39   ALK 104 119 114   TB 0.6 0.6 0.4      DIABETES THYROID   Recent Labs     10/08/22  0827   HA1C 5.7*    Recent Labs     10/08/22  0827   TSH 3.06         Pending/Orders Labs:  Lab Frequency Next Occurrence   US Scrotum & Contents with Testicular Doppler Complete Once 05/26/2022   CT Urogram without and with IV contrast Once 05/26/2022   TSH Once 10/12/2022   Comprehensive metabolic panel Once 12/16/2022   Hemoglobin A1c Once 12/16/2022   Lipid Panel (Reflex to Direct  LDL if Triglycerides more than 400) Once 12/16/2022   PSA (eff.05-2008) Once 12/16/2022   TSH Once 12/16/2022   Testosterone, Free by Immunoassay (Adult Males or Individuals on Testosterone Hormone Therapy) Once 12/16/2022   MR shoulder RIGHT without contrast Once 12/20/2022   Amylase Once 12/20/2022   Lipase Once 12/20/2022   US renal retroperitoneal complete Once 12/20/2022   MR shoulder LEFT without contrast Once 12/20/2022   CBC and differential Once 12/24/2022   Lactate, plasma (CONDITIONAL) ONE TIME

## 2022-12-27 ENCOUNTER — Other Ambulatory Visit: Payer: BLUE CROSS/BLUE SHIELD

## 2022-12-27 ENCOUNTER — Encounter: Payer: Self-pay | Admitting: Family Medicine

## 2022-12-27 DIAGNOSIS — E785 Hyperlipidemia, unspecified: Secondary | ICD-10-CM

## 2022-12-27 NOTE — Telephone Encounter (Signed)
Message from prior auth received below, please advise     FW: MRI DENIALS--P2P OR CX NEEDED  Received: Today  Moffat, Kourtni M  P Img Prior Auth [facility] [place] Fm; P Banks Fm Nurse; Shonna Chock [facility] Staff; Adelfa Koh  Good morning,    Following up on this patient as he is scheduled today. His MRI scans have been denied. All clinicals were submitted to insurance, including his most recent visit with your provider. Please advise if your office will be completing a peer to peer, as that is the only way to get the denials overturned. If a peer to peer will not be completed, please notify your patient.      Thanks,  Kourtni

## 2022-12-27 NOTE — Telephone Encounter (Signed)
Noted. Mychart sent. Peer to peer not planned.

## 2022-12-27 NOTE — Telephone Encounter (Signed)
FYI: Pt states he is all set with imaging at [facility]

## 2022-12-28 ENCOUNTER — Other Ambulatory Visit
Admission: RE | Admit: 2022-12-28 | Discharge: 2022-12-28 | Disposition: A | Discharge status: Home / Self Care | Payer: BLUE CROSS/BLUE SHIELD | Source: Ambulatory Visit | Attending: Family Medicine | Admitting: Family Medicine

## 2022-12-28 DIAGNOSIS — I480 Paroxysmal atrial fibrillation: Secondary | ICD-10-CM | POA: Insufficient documentation

## 2022-12-28 DIAGNOSIS — R935 Abnormal findings on diagnostic imaging of other abdominal regions, including retroperitoneum: Secondary | ICD-10-CM | POA: Insufficient documentation

## 2022-12-28 DIAGNOSIS — I10 Essential (primary) hypertension: Secondary | ICD-10-CM | POA: Insufficient documentation

## 2022-12-28 DIAGNOSIS — N451 Epididymitis: Secondary | ICD-10-CM | POA: Insufficient documentation

## 2022-12-28 DIAGNOSIS — R161 Splenomegaly, not elsewhere classified: Secondary | ICD-10-CM | POA: Insufficient documentation

## 2022-12-28 LAB — COMPREHENSIVE METABOLIC PANEL
ALT: 28 U/L (ref 0–50)
AST: 26 U/L (ref 0–50)
Albumin: 4.4 g/dL (ref 3.5–5.2)
Alk Phos: 105 U/L (ref 40–130)
Anion Gap: 11 (ref 7–16)
Bilirubin,Total: 0.9 mg/dL (ref 0.0–1.2)
CO2: 23 mmol/L (ref 20–28)
Calcium: 9.8 mg/dL (ref 8.6–10.2)
Chloride: 103 mmol/L (ref 96–108)
Creatinine: 1.15 mg/dL (ref 0.67–1.17)
Glucose: 92 mg/dL (ref 60–99)
Lab: 25 mg/dL — ABNORMAL HIGH (ref 6–20)
Potassium: 4.1 mmol/L (ref 3.3–5.1)
Sodium: 137 mmol/L (ref 133–145)
Total Protein: 6.7 g/dL (ref 6.3–7.7)
eGFR BY CREAT: 73 *

## 2022-12-28 LAB — TESTOSTERONE, FREE BY IMMUNOASSAY (ADULT MALES OR INDIVIDUALS ON TESTOSTERONE HORMONE THERAPY)
Testosterone,% Free: 2 %
Testosterone,Free: 93 pg/mL (ref 47–244)
Testosterone: 474 ng/dL (ref 193–740)

## 2022-12-28 LAB — LIPID PANEL
Chol/HDL Ratio: 5.4
Cholesterol: 161 mg/dL
HDL: 30 mg/dL — ABNORMAL LOW (ref 40–60)
LDL Calculated: 103 mg/dL
Non HDL Cholesterol: 131 mg/dL
Triglycerides: 138 mg/dL

## 2022-12-28 LAB — CBC AND DIFFERENTIAL
Baso # K/uL: 0.1 10*3/uL (ref 0.0–0.2)
Eos # K/uL: 0.3 10*3/uL (ref 0.0–0.5)
Hematocrit: 48 % (ref 37–52)
Hemoglobin: 15.9 g/dL (ref 12.0–17.0)
IMM Granulocytes #: 0 10*3/uL (ref 0.0–0.0)
IMM Granulocytes: 0.5 %
Lymph # K/uL: 0.8 10*3/uL — ABNORMAL LOW (ref 1.0–5.0)
MCV: 93 fL (ref 75–100)
Mono # K/uL: 0.7 10*3/uL (ref 0.1–1.0)
Neut # K/uL: 4.1 10*3/uL (ref 1.5–6.5)
Nucl RBC # K/uL: 0 10*3/uL (ref 0.0–0.0)
Nucl RBC %: 0 /100{WBCs} (ref 0.0–0.2)
Platelets: 274 10*3/uL (ref 150–450)
RBC: 5.1 MIL/uL (ref 4.0–6.0)
RDW: 13.2 % (ref 0.0–15.0)
Seg Neut %: 68.6 %
WBC: 6 10*3/uL (ref 3.5–11.0)

## 2022-12-28 LAB — LIPASE: Lipase: 48 U/L (ref 13–60)

## 2022-12-28 LAB — TSH: TSH: 4.12 u[IU]/mL (ref 0.27–4.20)

## 2022-12-28 LAB — SEX HORMONE BINDING GLOBULIN: Sex Hormone Binding Glob: 36 nmol/L (ref 10–80)

## 2022-12-28 LAB — PSA (EFF.4-2010): PSA (eff. 4-2010): 2.03 ng/mL (ref 0.00–4.00)

## 2022-12-28 LAB — AMYLASE: Amylase: 61 U/L (ref 28–100)

## 2022-12-28 LAB — HEMOGLOBIN A1C: Hemoglobin A1C: 5.1 %

## 2023-01-03 DIAGNOSIS — E785 Hyperlipidemia, unspecified: Secondary | ICD-10-CM | POA: Insufficient documentation

## 2023-01-03 MED ORDER — ROSUVASTATIN CALCIUM 10 MG PO TABS *I*
10.0000 mg | ORAL_TABLET | Freq: Every day | ORAL | 3 refills | Status: DC
Start: 2023-01-03 — End: 2023-05-04

## 2023-01-06 ENCOUNTER — Other Ambulatory Visit: Payer: Self-pay | Admitting: Primary Care

## 2023-01-06 NOTE — Telephone Encounter (Signed)
Last office visit with doctor:   12/20/2022  Last office visit with APP:   10/12/2022  Patients upcoming appointments:  Future Appointments   Date Time Provider Department Center   10/13/2023 10:15 AM [name], MD [facility] None     Recent Lab results:  GENERAL CHEMISTRY   Recent Labs     12/28/22  0836 10/21/22  0801 10/08/22  0827   NA 137 138 140   K 4.1 4.1 4.4   CL 103 102 102   CO2 23 22 25    GAP 11 14 13    UN 25* 31* 29*   CREAT 1.15 1.18* 1.26*   GLU 92 125* 122*   CA 9.8 10.2 9.9   URIC  --   --  6.0      LIPID PROFILE   Recent Labs     12/28/22  0836 10/08/22  0827   CHOL 161 161   TRIG 138 121   HDL 30* 33*   LDLC 103 104      LIVER PROFILE   Recent Labs     12/28/22  0836 10/08/22  0827 05/01/22  0906   ALT 28 30 37   AST 26 27 33   ALK 105 104 119   TB 0.9 0.6 0.6      DIABETES THYROID   Recent Labs     12/28/22  0836 10/08/22  0827   HA1C 5.1 5.7*    Recent Labs     12/28/22  0836 10/08/22  0827   TSH 4.12 3.06         Pending/Orders Labs:  Lab Frequency Next Occurrence   US Scrotum & Contents with Testicular Doppler Complete Once 05/26/2022   CT Urogram without and with IV contrast Once 05/26/2022   TSH Once 10/12/2022   MR shoulder RIGHT without contrast Once 12/20/2022   US renal retroperitoneal complete Once 12/20/2022   MR shoulder LEFT without contrast Once 12/20/2022   Lactate, plasma (CONDITIONAL) ONE TIME

## 2023-01-13 ENCOUNTER — Encounter: Payer: Self-pay | Admitting: Family Medicine

## 2023-01-13 MED ORDER — TIRZEPATIDE-WEIGHT MANAGEMENT 7.5 MG/0.5ML SC SOAJ *I*
7.5000 mg | SUBCUTANEOUS | 1 refills | Status: DC
Start: 2023-01-13 — End: 2023-02-24

## 2023-01-13 NOTE — Telephone Encounter (Signed)
7.5 mg pended

## 2023-01-25 ENCOUNTER — Encounter: Payer: Self-pay | Admitting: Family Medicine

## 2023-01-27 ENCOUNTER — Encounter: Payer: Self-pay | Admitting: Family Medicine

## 2023-01-27 NOTE — Telephone Encounter (Signed)
[address] [place], [place] [address]  [phone]  [facility].

## 2023-02-02 HISTORY — PX: LITHOTRIPSY: SUR834

## 2023-02-06 ENCOUNTER — Encounter: Payer: Self-pay | Admitting: Family Medicine

## 2023-02-06 DIAGNOSIS — M25511 Pain in right shoulder: Secondary | ICD-10-CM

## 2023-02-09 NOTE — Telephone Encounter (Signed)
Rx cued. [place] lpn

## 2023-02-10 NOTE — Telephone Encounter (Signed)
MRI right shoulder ordered.   Please fax order to [phone] per patient request, along with note from 12/20/22.

## 2023-02-11 NOTE — Telephone Encounter (Signed)
Writer called [employer] to start approval for MRI out of network, caller stated that patient is not in there network and to call the patient to get information on there insurance if it change at the beginning of the year. Writer spoke with patient and patient stated their insurance has not change and they will get in contact with [employer] [place] to get updated with insurance. Patient will call back once they have more information.

## 2023-02-11 NOTE — Telephone Encounter (Signed)
Prior authorization for MRI that was order is being review by patient insurance benefit management dept. Call [employer]     Case reference no: [id number]   [employer] prior authorization  team will call provider to schedule a peer to peer.       [employer] Medical Benefit Management: phone no: [phone]

## 2023-02-14 NOTE — Telephone Encounter (Signed)
Orders Fax per requested.

## 2023-02-14 NOTE — Telephone Encounter (Signed)
Patient decided to pay out of pocket for MRI.  He would like the order to be faxed to [facility] [phone]

## 2023-02-19 ENCOUNTER — Other Ambulatory Visit: Payer: Self-pay | Admitting: Family Medicine

## 2023-02-19 DIAGNOSIS — I10 Essential (primary) hypertension: Secondary | ICD-10-CM

## 2023-02-21 ENCOUNTER — Inpatient Hospital Stay: Admit: 2023-02-21 | Discharge: 2023-02-21 | Disposition: A | Discharge status: Home / Self Care | Payer: Self-pay

## 2023-02-21 ENCOUNTER — Encounter: Payer: Self-pay | Admitting: Family Medicine

## 2023-02-21 ENCOUNTER — Other Ambulatory Visit: Payer: Self-pay | Admitting: Gastroenterology

## 2023-02-21 DIAGNOSIS — E669 Obesity, unspecified: Secondary | ICD-10-CM

## 2023-02-21 IMAGING — MR MRI SHOULDER RT W/O CONTRAST
5 series · 40 of 40 positions shown · IV contrast (Off)
Comparison: none

------------- REPORT [id number] -------------
Request for Demographics


Study ordered:
MRI SHOULDER RT W/O CONTRAST
Referring:
GRABANT UBER
We received the order for the above patient but it did not come with any demographics. Can you please provide patient demographics.
------------- REPORT [id number] -------------
MRI OF THE RIGHT SHOULDER WITHOUT CONTRAST
CLINICAL HISTORY: Right shoulder pain following injury two months ago.
TECHNIQUE: Multisequential multiplanar imaging was performed of the right shoulder.

[Series 4: T2 · axial · right · 4.0mm · 0.78mm/px · z∈[-12,+50]mm · 9 of 17 slices shown (1 of 3)]
[im 1/17]
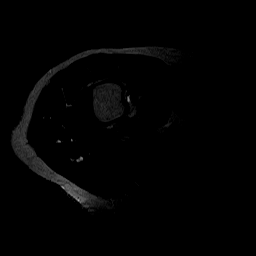
[im 3/17]
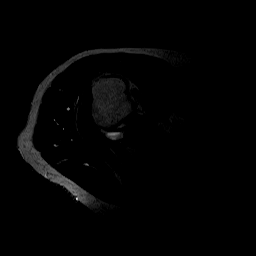
[im 5/17]
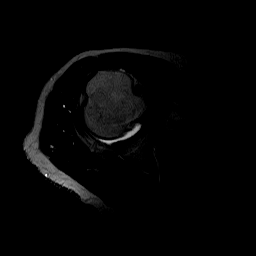
[im 7/17]
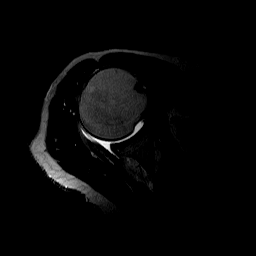
[im 9/17]
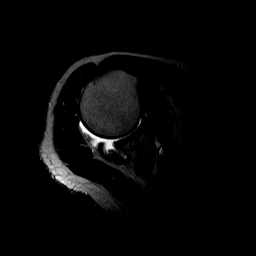
[im 11/17]
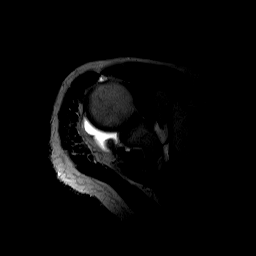
[im 13/17]
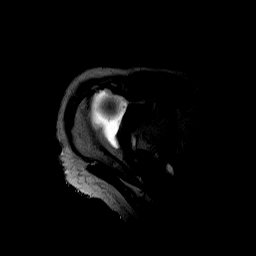
[im 15/17]
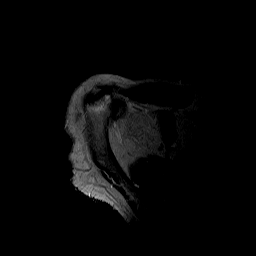
[im 17/17]
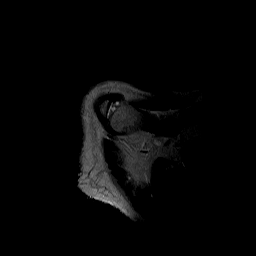

[Series 7: fat seps cor · oblique · right · 4.0mm · 0.78mm/px · 7 of 14 slices shown]
[im 1/14]
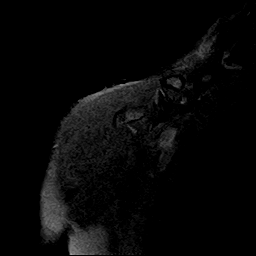
[im 3/14]
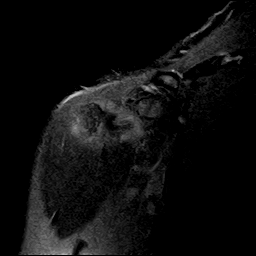
[im 5/14]
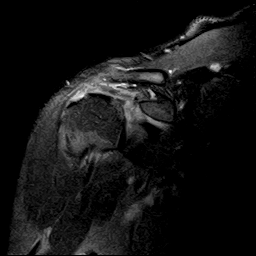
[im 7/14]
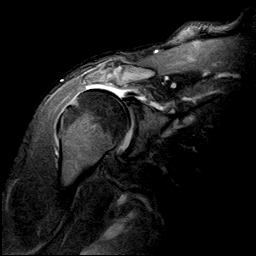
[im 9/14]
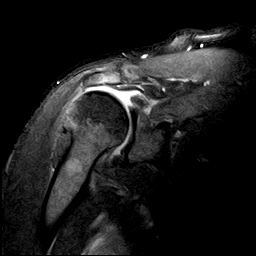
[im 11/14]
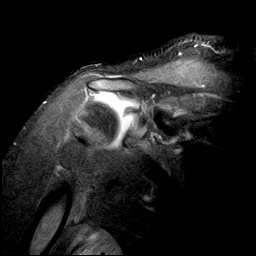
[im 14/14]
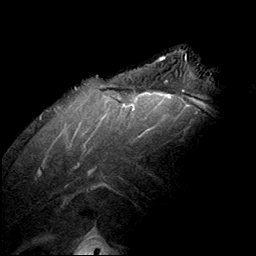

[Series 8: T1 · oblique · right · 4.0mm · 0.39mm/px · 7 of 14 slices shown]
[im 1/14]
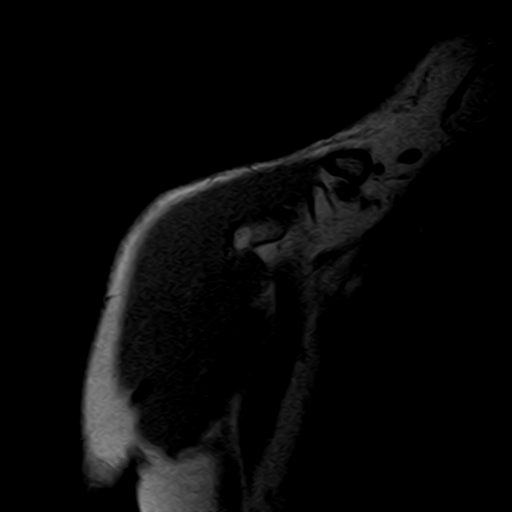
[im 3/14]
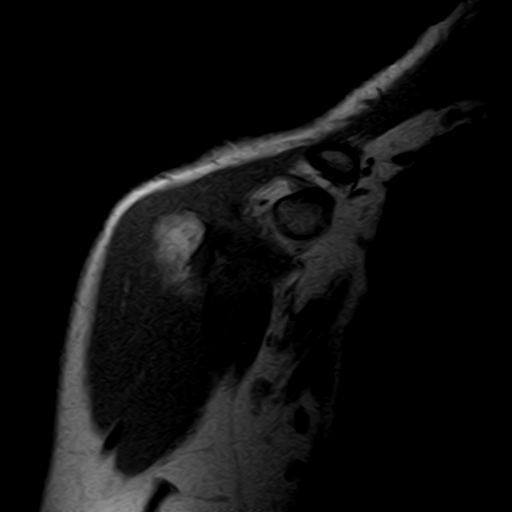
[im 5/14]
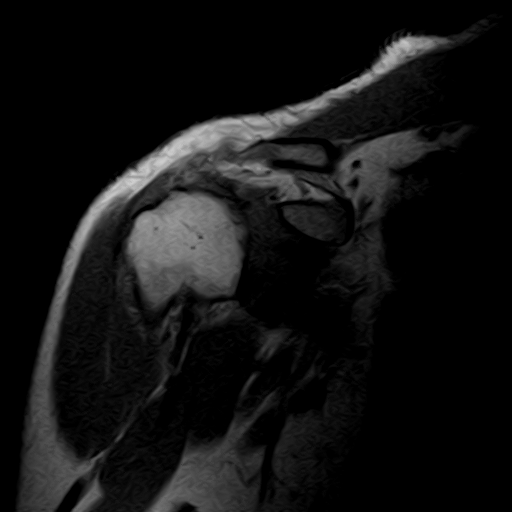
[im 7/14]
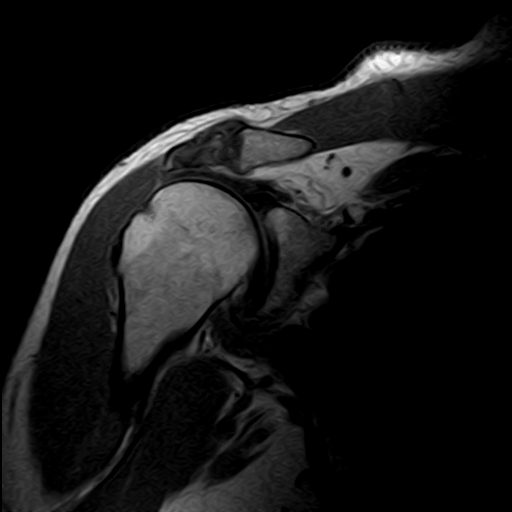
[im 9/14]
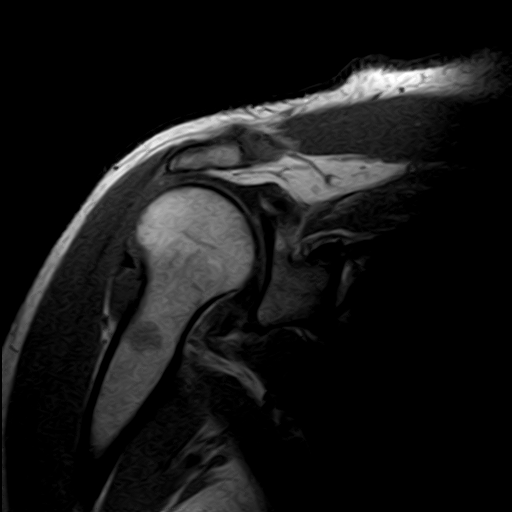
[im 11/14]
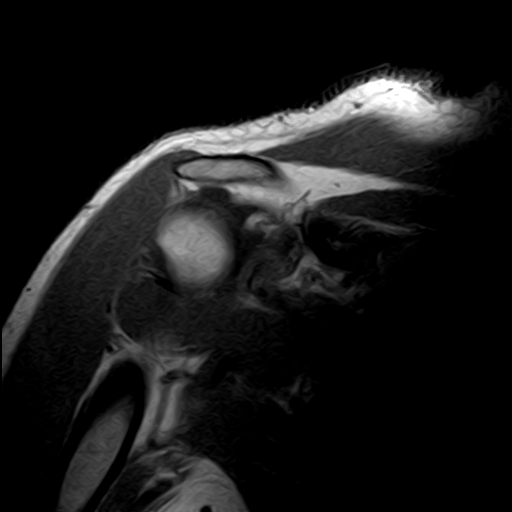
[im 14/14]
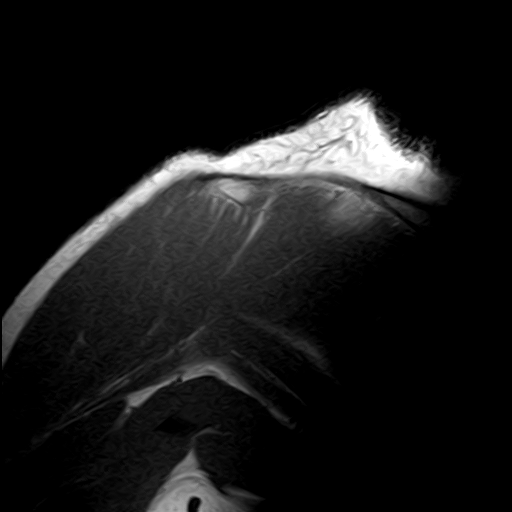

[Series 9: T2 · oblique · right · 4.0mm · 0.78mm/px · 7 of 14 slices shown (2 of 3)]
[im 1/14]
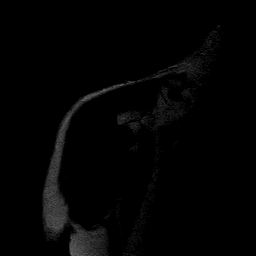
[im 3/14]
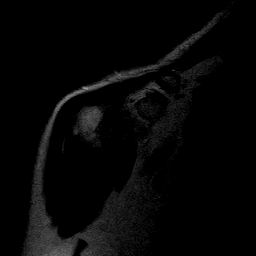
[im 5/14]
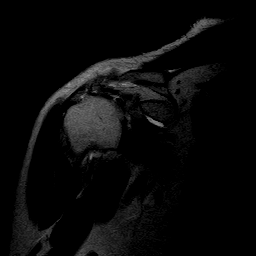
[im 7/14]
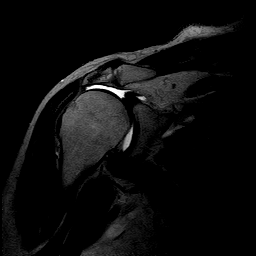
[im 9/14]
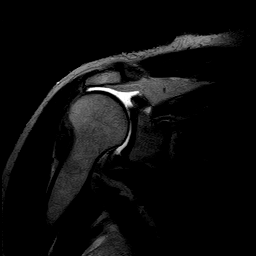
[im 11/14]
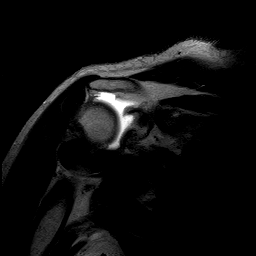
[im 14/14]
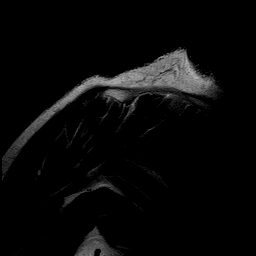

[Series 10: T2 · oblique · right · 4.5mm · 0.70mm/px · 10 of 19 slices shown (3 of 3)]
[im 1/19]
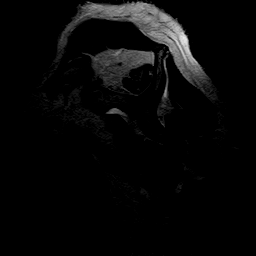
[im 3/19]
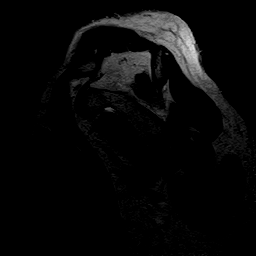
[im 5/19]
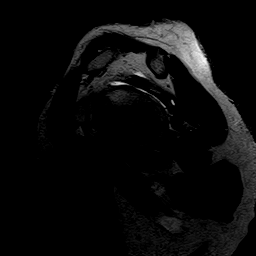
[im 7/19]
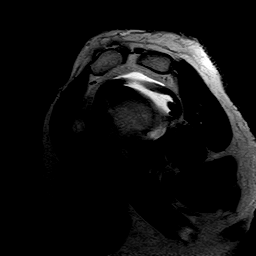
[im 9/19]
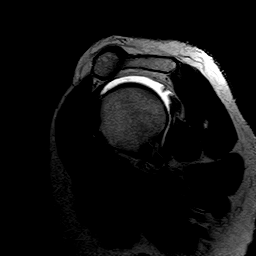
[im 11/19]
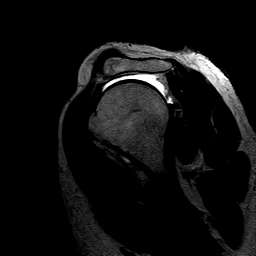
[im 13/19]
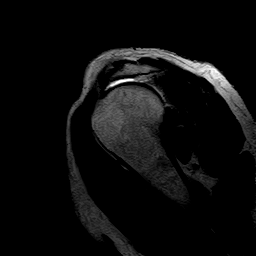
[im 15/19]
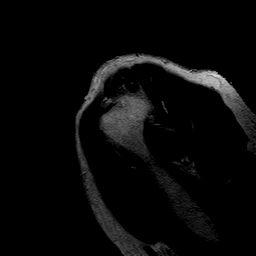
[im 17/19]
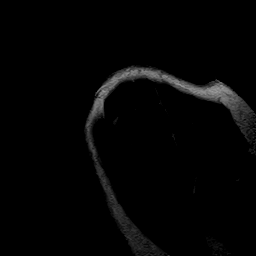
[im 19/19]
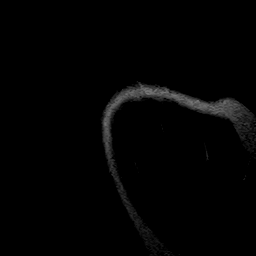

[40 of 40 positions shown; findings below may reference images not displayed]

FINDINGS: There is evidence of complete tearing of the supraspinatus, infraspinatus, teres minor, and probably subscapularis components of the conjoined tendon. Gap of at least 3.4 cm. High-riding humeral head. Significant fluid in the subacromial and subdeltoid bursa. There is evidence of fatty infiltration and atrophy of the associated rotator cuff musculature.

There is moderate to severe degenerative change of the acromioclavicular joint and mild to moderate degenerative change of the glenohumeral joint with moderate impingement. 

The biceps tendon appears to be intact.
IMPRESSION: 1.
Complete tearing of the supraspinatus, infraspinatus, teres minor, and probably subscapularis components of the conjoined tendon with a gap of at least 3.4 cm. Associated high-riding humeral head as well as fluid in the subacromial and subdeltoid bursa. There is evidence of fatty infiltration and atrophy of the associated rotator cuff musculature. 

2.
Moderate to severe degenerative change of the acromioclavicular joint and mild to moderate degenerative change of the glenohumeral joint with associated moderate impingement.

## 2023-02-22 ENCOUNTER — Telehealth: Payer: Self-pay | Admitting: Family Medicine

## 2023-02-22 NOTE — Telephone Encounter (Signed)
 Please fax CT a/p from 12/17/22.

## 2023-02-22 NOTE — Telephone Encounter (Signed)
 Dr.June called from the Urology office in [facility]     Patient has an appt at her office on 03/01/23 and she would like the last x-ray report because patient mentioned he has a kidney stone     Advised message will be sent to PCP-getting the approval/continuity of care     We'll fax it over to their office     Fax#[phone]

## 2023-02-22 NOTE — Telephone Encounter (Signed)
 Last office visit:   12/20/2022  Last telehome visit:   Visit date not found  Patients upcoming appointments:  Future Appointments   Date Time Provider Department Center   10/13/2023 10:15 AM [name], MD [facility] None     BP Readings from Last 3 Encounters:   12/20/22 112/68   12/17/22 127/80   10/12/22 128/84      Recent Lab results:  GENERAL CHEMISTRY   Recent Labs     12/28/22  0836 10/21/22  0801 10/08/22  0827   NA 137 138 140   K 4.1 4.1 4.4   CL 103 102 102   CO2 23 22 25    GAP 11 14 13    UN 25* 31* 29*   CREAT 1.15 1.18* 1.26*   GLU 92 125* 122*   CA 9.8 10.2 9.9   URIC  --   --  6.0      LIPID PROFILE   Recent Labs     12/28/22  0836 10/08/22  0827   CHOL 161 161   TRIG 138 121   HDL 30* 33*   LDLC 103 104      LIVER PROFILE   Recent Labs     12/28/22  0836 10/08/22  0827 05/01/22  0906   ALT 28 30 37   AST 26 27 33   ALK 105 104 119   TB 0.9 0.6 0.6      DIABETES THYROID   Recent Labs     12/28/22  0836 10/08/22  0827   HA1C 5.1 5.7*    Recent Labs     12/28/22  0836 10/08/22  0827   TSH 4.12 3.06         Pending/Orders Labs:  Lab Frequency Next Occurrence   US Scrotum & Contents with Testicular Doppler Complete Once 05/26/2022   CT Urogram without and with IV contrast Once 05/26/2022   TSH Once 10/12/2022   MR shoulder RIGHT without contrast Once 12/20/2022   US renal retroperitoneal complete Once 12/20/2022   MR shoulder LEFT without contrast Once 12/20/2022   MR shoulder RIGHT without contrast Once 02/10/2023   Lactate, plasma (CONDITIONAL) ONE TIME

## 2023-02-23 ENCOUNTER — Other Ambulatory Visit: Payer: Self-pay | Admitting: Orthopedic Surgery

## 2023-02-23 ENCOUNTER — Encounter: Payer: Self-pay | Admitting: Orthopedic Surgery

## 2023-02-23 DIAGNOSIS — M25519 Pain in unspecified shoulder: Secondary | ICD-10-CM

## 2023-02-23 NOTE — Telephone Encounter (Signed)
 Fax per requested

## 2023-02-24 MED ORDER — TIRZEPATIDE-WEIGHT MANAGEMENT 10 MG/0.5ML SC SOAJ *I*
10.0000 mg | SUBCUTANEOUS | 2 refills | Status: DC
Start: 2023-02-24 — End: 2023-05-04

## 2023-03-18 ENCOUNTER — Other Ambulatory Visit: Payer: Self-pay

## 2023-03-24 ENCOUNTER — Encounter: Payer: Self-pay | Admitting: Family Medicine

## 2023-03-24 DIAGNOSIS — E785 Hyperlipidemia, unspecified: Secondary | ICD-10-CM

## 2023-03-24 DIAGNOSIS — R5383 Other fatigue: Secondary | ICD-10-CM

## 2023-03-25 ENCOUNTER — Encounter: Payer: Self-pay | Admitting: Family Medicine

## 2023-03-28 NOTE — Telephone Encounter (Signed)
 Lab was fax to [facility] as requested

## 2023-03-29 ENCOUNTER — Other Ambulatory Visit: Payer: Self-pay | Admitting: Family Medicine

## 2023-03-29 ENCOUNTER — Encounter: Payer: Self-pay | Admitting: Family Medicine

## 2023-03-29 MED ORDER — HYDROCHLOROTHIAZIDE 25 MG PO TABS *I*
25.0000 mg | ORAL_TABLET | Freq: Every morning | ORAL | 1 refills | Status: DC
Start: 2023-03-29 — End: 2023-09-29

## 2023-03-29 NOTE — Telephone Encounter (Signed)
 Per requested, lab orders sent to [facility]

## 2023-03-29 NOTE — Telephone Encounter (Signed)
 Last office visit with doctor:   12/20/2022  Last office visit with APP:   10/12/2022  Patients upcoming appointments:  Future Appointments   Date Time Provider Department Center   07/05/2023  8:00 AM Jeanett Schlein, MD [facility] None   10/13/2023 10:15 AM [name], MD [facility] None     Recent Lab results:  GENERAL CHEMISTRY   Recent Labs     12/28/22  0836 10/21/22  0801 10/08/22  0827   NA 137 138 140   K 4.1 4.1 4.4   CL 103 102 102   CO2 23 22 25    GAP 11 14 13    UN 25* 31* 29*   CREAT 1.15 1.18* 1.26*   GLU 92 125* 122*   CA 9.8 10.2 9.9   URIC  --   --  6.0      LIPID PROFILE   Recent Labs     12/28/22  0836 10/08/22  0827   CHOL 161 161   TRIG 138 121   HDL 30* 33*   LDLC 103 104      LIVER PROFILE   Recent Labs     12/28/22  0836 10/08/22  0827 05/01/22  0906   ALT 28 30 37   AST 26 27 33   ALK 105 104 119   TB 0.9 0.6 0.6      DIABETES THYROID   Recent Labs     12/28/22  0836 10/08/22  0827   HA1C 5.1 5.7*    Recent Labs     12/28/22  0836 10/08/22  0827   TSH 4.12 3.06         Pending/Orders Labs:  Lab Frequency Next Occurrence   US Scrotum & Contents with Testicular Doppler Complete Once 05/26/2022   CT Urogram without and with IV contrast Once 05/26/2022   TSH Once 10/12/2022   MR shoulder RIGHT without contrast Once 12/20/2022   US renal retroperitoneal complete Once 12/20/2022   MR shoulder LEFT without contrast Once 12/20/2022   MR shoulder RIGHT without contrast Once 02/10/2023   Hemoglobin A1c Once 03/27/2023   Lipid Panel (Reflex to Direct  LDL if Triglycerides more than 400) Once 03/27/2023   Testosterone, Free by Immunoassay (Adult Males or Individuals on Testosterone Hormone Therapy) Once 03/27/2023   Lactate, plasma (CONDITIONAL) ONE TIME

## 2023-03-31 ENCOUNTER — Encounter: Payer: Self-pay | Admitting: Gastroenterology

## 2023-04-28 ENCOUNTER — Other Ambulatory Visit: Payer: Self-pay

## 2023-04-28 DIAGNOSIS — N400 Enlarged prostate without lower urinary tract symptoms: Secondary | ICD-10-CM

## 2023-04-28 NOTE — Telephone Encounter (Signed)
 Last office visit with doctor:   12/20/2022  Last office visit with APP:   10/12/2022  Patients upcoming appointments:  Future Appointments   Date Time Provider Department Center   07/05/2023  8:00 AM Jeanett Schlein, MD [facility] None   10/13/2023 10:15 AM [name], MD [facility] None     Recent Lab results:  GENERAL CHEMISTRY   Recent Labs     12/28/22  0836 10/21/22  0801 10/08/22  0827   NA 137 138 140   K 4.1 4.1 4.4   CL 103 102 102   CO2 23 22 25    GAP 11 14 13    UN 25* 31* 29*   CREAT 1.15 1.18* 1.26*   GLU 92 125* 122*   CA 9.8 10.2 9.9   URIC  --   --  6.0      LIPID PROFILE   Recent Labs     12/28/22  0836 10/08/22  0827   CHOL 161 161   TRIG 138 121   HDL 30* 33*   LDLC 103 104      LIVER PROFILE   Recent Labs     12/28/22  0836 10/08/22  0827 05/01/22  0906   ALT 28 30 37   AST 26 27 33   ALK 105 104 119   TB 0.9 0.6 0.6      DIABETES THYROID   Recent Labs     12/28/22  0836 10/08/22  0827   HA1C 5.1 5.7*    Recent Labs     12/28/22  0836 10/08/22  0827   TSH 4.12 3.06         Pending/Orders Labs:  Lab Frequency Next Occurrence   US Scrotum & Contents with Testicular Doppler Complete Once 05/26/2022   CT Urogram without and with IV contrast Once 05/26/2022   TSH Once 10/12/2022   MR shoulder RIGHT without contrast Once 12/20/2022   US renal retroperitoneal complete Once 12/20/2022   MR shoulder LEFT without contrast Once 12/20/2022   MR shoulder RIGHT without contrast Once 02/10/2023   Hemoglobin A1c Once 03/27/2023   Lipid Panel (Reflex to Direct  LDL if Triglycerides more than 400) Once 03/27/2023   Testosterone, Free by Immunoassay (Adult Males or Individuals on Testosterone Hormone Therapy) Once 03/27/2023   Lactate, plasma (CONDITIONAL) ONE TIME

## 2023-05-02 ENCOUNTER — Encounter: Payer: Self-pay | Admitting: Family Medicine

## 2023-05-02 DIAGNOSIS — E669 Obesity, unspecified: Secondary | ICD-10-CM

## 2023-05-04 MED ORDER — TIRZEPATIDE-WEIGHT MANAGEMENT 12.5 MG/0.5ML SC SOAJ *A*
12.5000 mg | SUBCUTANEOUS | 3 refills | Status: DC
Start: 2023-05-04 — End: 2023-05-19

## 2023-05-19 ENCOUNTER — Other Ambulatory Visit: Payer: Self-pay | Admitting: Family Medicine

## 2023-05-19 DIAGNOSIS — E669 Obesity, unspecified: Secondary | ICD-10-CM

## 2023-05-19 NOTE — Telephone Encounter (Signed)
 Last office visit with doctor:   12/20/2022  Last office visit with APP:   10/12/2022  Patients upcoming appointments:  Future Appointments   Date Time Provider Department Center   07/05/2023  8:00 AM Jeanett Schlein, MD [facility] None   10/13/2023 10:15 AM [name], MD [facility] None     Recent Lab results:  GENERAL CHEMISTRY   Recent Labs     12/28/22  0836 10/21/22  0801 10/08/22  0827   NA 137 138 140   K 4.1 4.1 4.4   CL 103 102 102   CO2 23 22 25    GAP 11 14 13    UN 25* 31* 29*   CREAT 1.15 1.18* 1.26*   GLU 92 125* 122*   CA 9.8 10.2 9.9   URIC  --   --  6.0      LIPID PROFILE   Recent Labs     12/28/22  0836 10/08/22  0827   CHOL 161 161   TRIG 138 121   HDL 30* 33*   LDLC 103 104      LIVER PROFILE   Recent Labs     12/28/22  0836 10/08/22  0827   ALT 28 30   AST 26 27   ALK 105 104   TB 0.9 0.6      DIABETES THYROID   Recent Labs     12/28/22  0836 10/08/22  0827   HA1C 5.1 5.7*    Recent Labs     12/28/22  0836 10/08/22  0827   TSH 4.12 3.06         Pending/Orders Labs:  Lab Frequency Next Occurrence   US Scrotum & Contents with Testicular Doppler Complete Once 05/26/2022   CT Urogram without and with IV contrast Once 05/26/2022   TSH Once 10/12/2022   MR shoulder RIGHT without contrast Once 12/20/2022   US renal retroperitoneal complete Once 12/20/2022   MR shoulder LEFT without contrast Once 12/20/2022   MR shoulder RIGHT without contrast Once 02/10/2023   Hemoglobin A1c Once 03/27/2023   Lipid Panel (Reflex to Direct  LDL if Triglycerides more than 400) Once 03/27/2023   Testosterone, Free by Immunoassay (Adult Males or Individuals on Testosterone Hormone Therapy) Once 03/27/2023   Lactate, plasma (CONDITIONAL) ONE TIME

## 2023-05-20 ENCOUNTER — Other Ambulatory Visit: Payer: Self-pay | Admitting: Family Medicine

## 2023-05-20 DIAGNOSIS — I10 Essential (primary) hypertension: Secondary | ICD-10-CM

## 2023-05-20 MED ORDER — TIRZEPATIDE-WEIGHT MANAGEMENT 12.5 MG/0.5ML SC SOAJ *A*
12.5000 mg | SUBCUTANEOUS | 3 refills | Status: DC
Start: 2023-05-20 — End: 2023-07-27

## 2023-05-20 NOTE — Telephone Encounter (Signed)
 Last office visit with doctor:   12/20/2022  Last office visit with APP:   10/12/2022  Patients upcoming appointments:  Future Appointments   Date Time Provider Department Center   07/05/2023  8:00 AM Jeanett Schlein, MD [facility] None   10/13/2023 10:15 AM [name], MD [facility] None     Recent Lab results:  GENERAL CHEMISTRY   Recent Labs     12/28/22  0836 10/21/22  0801 10/08/22  0827   NA 137 138 140   K 4.1 4.1 4.4   CL 103 102 102   CO2 23 22 25    GAP 11 14 13    UN 25* 31* 29*   CREAT 1.15 1.18* 1.26*   GLU 92 125* 122*   CA 9.8 10.2 9.9   URIC  --   --  6.0      LIPID PROFILE   Recent Labs     12/28/22  0836 10/08/22  0827   CHOL 161 161   TRIG 138 121   HDL 30* 33*   LDLC 103 104      LIVER PROFILE   Recent Labs     12/28/22  0836 10/08/22  0827   ALT 28 30   AST 26 27   ALK 105 104   TB 0.9 0.6      DIABETES THYROID   Recent Labs     12/28/22  0836 10/08/22  0827   HA1C 5.1 5.7*    Recent Labs     12/28/22  0836 10/08/22  0827   TSH 4.12 3.06         Pending/Orders Labs:  Lab Frequency Next Occurrence   US Scrotum & Contents with Testicular Doppler Complete Once 05/26/2022   CT Urogram without and with IV contrast Once 05/26/2022   TSH Once 10/12/2022   MR shoulder RIGHT without contrast Once 12/20/2022   US renal retroperitoneal complete Once 12/20/2022   MR shoulder LEFT without contrast Once 12/20/2022   MR shoulder RIGHT without contrast Once 02/10/2023   Hemoglobin A1c Once 03/27/2023   Lipid Panel (Reflex to Direct  LDL if Triglycerides more than 400) Once 03/27/2023   Testosterone, Free by Immunoassay (Adult Males or Individuals on Testosterone Hormone Therapy) Once 03/27/2023   Lactate, plasma (CONDITIONAL) ONE TIME

## 2023-05-25 ENCOUNTER — Other Ambulatory Visit: Payer: Self-pay

## 2023-05-25 DIAGNOSIS — Z8739 Personal history of other diseases of the musculoskeletal system and connective tissue: Secondary | ICD-10-CM

## 2023-05-25 NOTE — Telephone Encounter (Signed)
 Last office visit with doctor:   12/20/2022  Last office visit with APP:   10/12/2022  Patients upcoming appointments:  Future Appointments   Date Time Provider Department Center   07/05/2023  8:00 AM Jeanett Schlein, MD [facility] None   10/13/2023 10:15 AM [name], MD [facility] None     Recent Lab results:  GENERAL CHEMISTRY   Recent Labs     12/28/22  0836 10/21/22  0801 10/08/22  0827   NA 137 138 140   K 4.1 4.1 4.4   CL 103 102 102   CO2 23 22 25    GAP 11 14 13    UN 25* 31* 29*   CREAT 1.15 1.18* 1.26*   GLU 92 125* 122*   CA 9.8 10.2 9.9   URIC  --   --  6.0      LIPID PROFILE   Recent Labs     12/28/22  0836 10/08/22  0827   CHOL 161 161   TRIG 138 121   HDL 30* 33*   LDLC 103 104      LIVER PROFILE   Recent Labs     12/28/22  0836 10/08/22  0827   ALT 28 30   AST 26 27   ALK 105 104   TB 0.9 0.6      DIABETES THYROID   Recent Labs     12/28/22  0836 10/08/22  0827   HA1C 5.1 5.7*    Recent Labs     12/28/22  0836 10/08/22  0827   TSH 4.12 3.06         Pending/Orders Labs:  Lab Frequency Next Occurrence   US Scrotum & Contents with Testicular Doppler Complete Once 05/26/2022   CT Urogram without and with IV contrast Once 05/26/2022   TSH Once 10/12/2022   MR shoulder RIGHT without contrast Once 12/20/2022   US renal retroperitoneal complete Once 12/20/2022   MR shoulder LEFT without contrast Once 12/20/2022   MR shoulder RIGHT without contrast Once 02/10/2023   Hemoglobin A1c Once 03/27/2023   Lipid Panel (Reflex to Direct  LDL if Triglycerides more than 400) Once 03/27/2023   Testosterone, Free by Immunoassay (Adult Males or Individuals on Testosterone Hormone Therapy) Once 03/27/2023   Lactate, plasma (CONDITIONAL) ONE TIME

## 2023-06-01 ENCOUNTER — Encounter: Payer: Self-pay | Admitting: Family Medicine

## 2023-06-01 DIAGNOSIS — R509 Fever, unspecified: Secondary | ICD-10-CM

## 2023-06-01 NOTE — Telephone Encounter (Signed)
 This patient attachment is clinically relevant.  Please keep in the patient's chart.    [x]  Document  []  Photo    Brief attachment description: Urinalysis  (Ex. L forearm rash, WC papers)    Thank you,  Ashok Blake, MD

## 2023-06-02 NOTE — Telephone Encounter (Signed)
Faxed requested info to # provided.

## 2023-06-13 ENCOUNTER — Encounter: Payer: Self-pay | Admitting: Family Medicine

## 2023-06-25 ENCOUNTER — Other Ambulatory Visit: Payer: Self-pay | Admitting: Family Medicine

## 2023-06-28 NOTE — Telephone Encounter (Signed)
 Last office visit with doctor:   12/20/2022  Last office visit with APP:   10/12/2022  Patients upcoming appointments:  Future Appointments   Date Time Provider Department Center   09/13/2023 10:30 AM Jeffie Mingle, MD [facility] None   10/13/2023 10:15 AM Ashok Blake, MD [facility] None     Recent Lab results:  GENERAL CHEMISTRY   Recent Labs     12/28/22  0836 10/21/22  0801 10/08/22  0827   NA 137 138 140   K 4.1 4.1 4.4   CL 103 102 102   CO2 23 22 25    GAP 11 14 13    UN 25* 31* 29*   CREAT 1.15 1.18* 1.26*   GLU 92 125* 122*   CA 9.8 10.2 9.9   URIC  --   --  6.0      LIPID PROFILE   Recent Labs     12/28/22  0836 10/08/22  0827   CHOL 161 161   TRIG 138 121   HDL 30* 33*   LDLC 103 104      LIVER PROFILE   Recent Labs     12/28/22  0836 10/08/22  0827   ALT 28 30   AST 26 27   ALK 105 104   TB 0.9 0.6      DIABETES THYROID    Recent Labs     12/28/22  0836 10/08/22  0827   HA1C 5.1 5.7*    Recent Labs     12/28/22  0836 10/08/22  0827   TSH 4.12 3.06         Pending/Orders Labs:  Lab Frequency Next Occurrence   TSH Once 10/12/2022   MR shoulder RIGHT without contrast Once 12/20/2022   US  renal retroperitoneal complete Once 12/20/2022   MR shoulder LEFT without contrast Once 12/20/2022   MR shoulder RIGHT without contrast Once 02/10/2023   Hemoglobin A1c Once 03/27/2023   Lipid Panel (Reflex to Direct  LDL if Triglycerides more than 400) Once 03/27/2023   Testosterone , Free by Immunoassay (Adult Males or Individuals on Testosterone  Hormone Therapy) Once 03/27/2023   Aerobic culture (urine-voided) Once 06/01/2023   Urinalysis with reflex to microscopic Once 06/01/2023   Lactate, plasma (CONDITIONAL) ONE TIME

## 2023-07-04 ENCOUNTER — Encounter: Payer: Self-pay | Admitting: Family Medicine

## 2023-07-05 ENCOUNTER — Ambulatory Visit: Payer: BLUE CROSS/BLUE SHIELD | Admitting: Sports Medicine

## 2023-07-05 NOTE — Telephone Encounter (Signed)
 I didn't work on 5/19 and nothing left on my desk for this pt. Is this familiar to you?

## 2023-07-07 ENCOUNTER — Encounter: Payer: Self-pay | Admitting: Family Medicine

## 2023-07-07 DIAGNOSIS — I480 Paroxysmal atrial fibrillation: Secondary | ICD-10-CM

## 2023-07-07 NOTE — Telephone Encounter (Signed)
 LVMM and sent mychart regarding why referral needs to be placed.  Pended for PCP     [facility], lpn

## 2023-07-07 NOTE — Telephone Encounter (Signed)
 Patient states he has a history of AFIB.    Patient last saw former Cardiologist in June 2021.  Former Development worker, international aid no longer accept his insurance.     [facility] cannot see patient without a referral from PCP.    Dr. Berenice Bream  [facility]  [address]  [place]  [address]  [phone] phone  [phone] fax    Any questions, call patient at [phone] cell.

## 2023-07-08 NOTE — Telephone Encounter (Signed)
 Referral faxed to [facility] # provided below, MyChart to Pt

## 2023-07-12 ENCOUNTER — Encounter: Payer: Self-pay | Admitting: Family Medicine

## 2023-07-12 DIAGNOSIS — I480 Paroxysmal atrial fibrillation: Secondary | ICD-10-CM

## 2023-07-14 NOTE — Telephone Encounter (Signed)
 Referral processed, [facility] to Pt with info

## 2023-07-19 ENCOUNTER — Other Ambulatory Visit: Payer: Self-pay | Admitting: Family Medicine

## 2023-07-19 DIAGNOSIS — I10 Essential (primary) hypertension: Secondary | ICD-10-CM

## 2023-07-19 DIAGNOSIS — I480 Paroxysmal atrial fibrillation: Secondary | ICD-10-CM

## 2023-07-19 NOTE — Telephone Encounter (Signed)
 Last office visit with doctor:   12/20/2022  Last office visit with APP:   10/12/2022  Patients upcoming appointments:  Future Appointments   Date Time Provider Department Center   09/06/2023 10:40 AM Rometta Coad, MD [facility]   09/13/2023 10:30 AM Jeffie Mingle, MD [facility] None   10/13/2023 10:15 AM Ashok Blake, MD [facility] None     Recent Lab results:  GENERAL CHEMISTRY   Recent Labs     12/28/22  0836 10/21/22  0801 10/08/22  0827   NA 137 138 140   K 4.1 4.1 4.4   CL 103 102 102   CO2 23 22 25    GAP 11 14 13    UN 25* 31* 29*   CREAT 1.15 1.18* 1.26*   GLU 92 125* 122*   CA 9.8 10.2 9.9   URIC  --   --  6.0      LIPID PROFILE   Recent Labs     12/28/22  0836 10/08/22  0827   CHOL 161 161   TRIG 138 121   HDL 30* 33*   LDLC 103 104      LIVER PROFILE   Recent Labs     12/28/22  0836 10/08/22  0827   ALT 28 30   AST 26 27   ALK 105 104   TB 0.9 0.6      DIABETES THYROID    Recent Labs     12/28/22  0836 10/08/22  0827   HA1C 5.1 5.7*    Recent Labs     12/28/22  0836 10/08/22  0827   TSH 4.12 3.06         Pending/Orders Labs:  Lab Frequency Next Occurrence   TSH Once 10/12/2022   MR shoulder RIGHT without contrast Once 12/20/2022   US  renal retroperitoneal complete Once 12/20/2022   MR shoulder LEFT without contrast Once 12/20/2022   MR shoulder RIGHT without contrast Once 02/10/2023   Hemoglobin A1c Once 03/27/2023   Lipid Panel (Reflex to Direct  LDL if Triglycerides more than 400) Once 03/27/2023   Testosterone , Free by Immunoassay (Adult Males or Individuals on Testosterone  Hormone Therapy) Once 03/27/2023   Aerobic culture (urine-voided) Once 06/01/2023   Urinalysis with reflex to microscopic Once 06/01/2023   Lactate, plasma (CONDITIONAL) ONE TIME

## 2023-07-26 ENCOUNTER — Telehealth: Payer: Self-pay | Admitting: Family Medicine

## 2023-07-26 ENCOUNTER — Encounter: Payer: Self-pay | Admitting: Family Medicine

## 2023-07-26 DIAGNOSIS — E669 Obesity, unspecified: Secondary | ICD-10-CM

## 2023-07-26 NOTE — Telephone Encounter (Signed)
My chart sent to patient for clarification

## 2023-07-26 NOTE — Telephone Encounter (Signed)
 Pharmacy sent a fax requesting Wegovy 0.25mg /0.26ml pen

## 2023-07-26 NOTE — Telephone Encounter (Signed)
 Not on pt med list. I tried calling pharmacy, put on abnormally long hold twice. Earlier [employer] states Zepbound  may not be covered after July.

## 2023-07-27 MED ORDER — TIRZEPATIDE-WEIGHT MANAGEMENT 12.5 MG/0.5ML SC SOAJ *A*
12.5000 mg | SUBCUTANEOUS | 1 refills | Status: DC
Start: 2023-07-27 — End: 2023-08-12

## 2023-08-10 ENCOUNTER — Other Ambulatory Visit: Payer: Self-pay | Admitting: Family Medicine

## 2023-08-10 DIAGNOSIS — E669 Obesity, unspecified: Secondary | ICD-10-CM

## 2023-08-14 ENCOUNTER — Other Ambulatory Visit: Payer: Self-pay | Admitting: Registered Nurse

## 2023-08-14 DIAGNOSIS — I10 Essential (primary) hypertension: Secondary | ICD-10-CM

## 2023-08-15 NOTE — Telephone Encounter (Signed)
 Last office visit with doctor:   12/20/2022  Last office visit with APP:   10/12/2022  Patients upcoming appointments:  Future Appointments   Date Time Provider Department Center   09/06/2023 10:40 AM Delman Damien Reusing, MD [facility]   09/13/2023 10:30 AM Carlton Pipe, MD [facility] None   10/13/2023 10:15 AM Arlana Tobias SAUNDERS, MD [facility] None     Recent Lab results:  GENERAL CHEMISTRY   Recent Labs     12/28/22  0836 10/21/22  0801 10/08/22  0827   NA 137 138 140   K 4.1 4.1 4.4   CL 103 102 102   CO2 23 22 25    GAP 11 14 13    UN 25* 31* 29*   CREAT 1.15 1.18* 1.26*   GLU 92 125* 122*   CA 9.8 10.2 9.9   URIC  --   --  6.0      LIPID PROFILE   Recent Labs     12/28/22  0836 10/08/22  0827   CHOL 161 161   TRIG 138 121   HDL 30* 33*   LDLC 103 104      LIVER PROFILE   Recent Labs     12/28/22  0836 10/08/22  0827   ALT 28 30   AST 26 27   ALK 105 104   TB 0.9 0.6      DIABETES THYROID    Recent Labs     12/28/22  0836 10/08/22  0827   HA1C 5.1 5.7*    Recent Labs     12/28/22  0836 10/08/22  0827   TSH 4.12 3.06         Pending/Orders Labs:  Lab Frequency Next Occurrence   TSH Once 10/12/2022   MR shoulder RIGHT without contrast Once 12/20/2022   US  renal retroperitoneal complete Once 12/20/2022   MR shoulder LEFT without contrast Once 12/20/2022   MR shoulder RIGHT without contrast Once 02/10/2023   Hemoglobin A1c Once 03/27/2023   Lipid Panel (Reflex to Direct  LDL if Triglycerides more than 400) Once 03/27/2023   Testosterone , Free by Immunoassay (Adult Males or Individuals on Testosterone  Hormone Therapy) Once 03/27/2023   Aerobic culture (urine-voided) Once 06/01/2023   Urinalysis with reflex to microscopic Once 06/01/2023   Lactate, plasma (CONDITIONAL) ONE TIME       Opioid Drug Screen:  No results for input(s): AMPU, BEU, OPSU, OXYU, THCU, BZDU, COPS, CTHC, UCRNC, TRAMD in the last 8760 hours.

## 2023-08-17 ENCOUNTER — Encounter: Payer: Self-pay | Admitting: Family Medicine

## 2023-08-30 ENCOUNTER — Encounter: Payer: Self-pay | Admitting: Family Medicine

## 2023-08-30 DIAGNOSIS — Z0000 Encounter for general adult medical examination without abnormal findings: Secondary | ICD-10-CM

## 2023-08-30 DIAGNOSIS — I10 Essential (primary) hypertension: Secondary | ICD-10-CM

## 2023-08-30 DIAGNOSIS — R5383 Other fatigue: Secondary | ICD-10-CM

## 2023-08-30 DIAGNOSIS — N400 Enlarged prostate without lower urinary tract symptoms: Secondary | ICD-10-CM

## 2023-09-05 ENCOUNTER — Other Ambulatory Visit: Payer: Self-pay

## 2023-09-06 ENCOUNTER — Encounter: Payer: Self-pay | Admitting: Internal Medicine

## 2023-09-06 ENCOUNTER — Ambulatory Visit: Admitting: Internal Medicine

## 2023-09-06 VITALS — BP 107/83 | HR 72 | Ht 76.0 in | Wt 222.0 lb

## 2023-09-06 DIAGNOSIS — I480 Paroxysmal atrial fibrillation: Secondary | ICD-10-CM

## 2023-09-06 DIAGNOSIS — I2510 Atherosclerotic heart disease of native coronary artery without angina pectoris: Secondary | ICD-10-CM

## 2023-09-06 DIAGNOSIS — Z789 Other specified health status: Secondary | ICD-10-CM

## 2023-09-06 DIAGNOSIS — E785 Hyperlipidemia, unspecified: Secondary | ICD-10-CM

## 2023-09-06 DIAGNOSIS — I440 Atrioventricular block, first degree: Secondary | ICD-10-CM

## 2023-09-06 MED ORDER — ASPIRIN 81 MG PO TBEC *I*
81.0000 mg | DELAYED_RELEASE_TABLET | Freq: Every day | ORAL | 3 refills | Status: AC
Start: 2023-09-06 — End: ?

## 2023-09-06 MED ORDER — ROSUVASTATIN CALCIUM 10 MG PO TABS *I*
10.0000 mg | ORAL_TABLET | Freq: Every day | ORAL | 3 refills | Status: DC
Start: 2023-09-06 — End: 2023-10-13

## 2023-09-06 NOTE — Progress Notes (Signed)
 CARDIOLOGY CONSULT Dear Arlana, Tobias SAUNDERS, MD,I had the pleasure of seeing your patient, Jorge Boyd, in cardiology consultation on 09/06/2023 for evaluation of paroxysmal atrial fibrillation.History of Present IllnessThe patient presents for evaluation of atrial fibrillation.He experienced an episode of atrial fibrillation in 2020, which was attributed to sepsis from a urinary tract infection. 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 A month later, his cardiologist in [place]  reported no abnormalities after a 30-day monitoring period. He is currently on diltiazem  for atrial fibrillation and hydrochlorothiazide , which was initially prescribed to manage fluid retention related to recurrent urinary tract infections, no history of hypertension. He was also on tamsulosin  to aid bladder emptying, but this has been discontinued. He maintains an active lifestyle, running daily without experiencing shortness of breath or chest discomfort. He has lost weight from 300 pounds to 220 pounds over the past year, following hip replacement surgery. 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 These were treated with lithotripsy 3 to 4 months ago, and he has not had any issues since.He was prescribed a statin by Dr. Arlana but did not start it due to his low cholesterol levels. He is scheduled for a physical examination with Dr. Arlana on 10/13/2023.His testosterone  levels have decreased since his shoulder injury, which has prevented him from lifting weights. He plans to have his testosterone  levels rechecked.He fell off a ladder on 12/17/2022 and had broken ribs and a torn rotator cuff. PAST MEDICAL HISTORY:Past Medical History[1]Past Surgical History[2]FAMILY HISTORYFamily History[3]SOCIAL HISTORYSocial  History[4]Complete 12 point ROS reviewed and negative except as stated above. ALLERGIES:Allergies[5]CURRENT MEDICATIONS:Current Outpatient Medications Medication Sig Dispense Refill  tadalafil  (CIALIS ) 5 MG tablet TAKE 1 TABLET (5 MG TOTAL) BY MOUTH DAILY. 30 tablet 2  semaglutide  for weight management, 1 mg/dose, (WEGOVY ) 1 mg/0.91mL auto-injector Inject 1 mg into the skin every 7 days for 28 days. 2 mL 0  potassium citrate  (UROCIT-K ) 10 mEq (1080 mg) CR tablet TAKE 1 TABLET BY MOUTH DAILY FOR HISTORY OF KIDNEY STONES 90 tablet 1  allopurinol  (ZYLOPRIM ) 300 mg tablet TAKE 1 TABLET BY MOUTH EVERY DAY 90 tablet 1  hydroCHLOROthiazide  (HYDRODIURIL ) 25 mg tablet Take 1 tablet (25 mg total) by mouth every morning. 90 tablet 1  Cholecalciferol  (VITAMIN D) 125 MCG (5000 UT) CAPS Take 1 capsule by mouth daily for Vitamin D Deficiency    biotin  5 MG tablet Take 1 tablet (5 mg total) by mouth daily for Vitamin Deficiency.    ascorbic acid  (VITAMIN C ) 1,000 mg tablet Take 1 tablet (1,000 mg total) by mouth daily for promote bone healing.    Cranberry 4200 MG CAPS Take 1 capsule by mouth daily for urinary issues    Zinc 50 MG TABS Take 1 tablet by mouth daily for Zinc Deficiency    co-enzyme Q-10 100 MG oral solid Take 1 each (100 mg total) by mouth daily for Heart Rhythm Disorder.    Multiple Vitamin (MULTIVITAMIN PO) Take 1 tablet by mouth daily for vitamin supplement    aspirin  81 mg EC tablet Take 1 tablet (81 mg total) by mouth daily. 90 tablet 3  rosuvastatin  (CRESTOR ) 10 mg tablet Take 1 tablet (10 mg total) by mouth daily. 90 tablet 3 No current facility-administered medications for this visit. VITALS: BP 107/83   Pulse 72   Ht 1.93 m (6' 4)   Wt 100.7 kg (  222 lb)   SpO2 97%   BMI 27.02 kg/m Wt Readings from Last 3 Encounters: 09/06/23 100.7 kg (222 lb) 12/20/22 128.8 kg (284 lb) 12/17/22 133.8 kg (295 lb) PHYSICAL EXAM:CONSTITUTIONAL:  Pleasant and interactive.NEURO: Alert and oriented in no acute distress.HENT: MMM, oropharynx clear, anicteric sclerae.LYMPH: Supple without lymphadenopathy.NECK VASCULAR: JVP is approximately 5 cm above the right atrium.  There are no carotid bruits appreciated bilaterally.CARDIOVASCULAR: A comprehensive cardiovascular exam was performed with details as follows: Regular rhythm, normal S1-S2, no S3 or S4, there are no murmurs or rubs appreciated. PMI is normal in position in character.  No lower extremity edema noted.PULM:  Breathing comfortably on room air. Lungs are clear to auscultation bilaterally without wheezes, rales, or rhonchi.GI: Soft, nontender, nondistended.Lab Results Component Value Date/Time  CHOL 161 12/28/2022 08:36 AM  CHOL 161 10/08/2022 08:27 AM  HDL 30 (L) 12/28/2022 08:36 AM  HDL 33 (L) 10/08/2022 08:27 AM  LDLC 103 12/28/2022 08:36 AM  LDLC 104 10/08/2022 08:27 AM Lab Results Component Value Date  NA 137 12/28/2022  NA 138 10/21/2022  K 4.1 12/28/2022  K 4.1 10/21/2022  CL 103 12/28/2022  CL 102 10/21/2022  CO2 23 12/28/2022  CO2 22 10/21/2022  UN 25 (H) 12/28/2022  UN 31 (H) 10/21/2022  CREAT 1.15 12/28/2022  CREAT 1.18 (H) 10/21/2022  GLU 92 12/28/2022  GLU 125 (H) 10/21/2022  AST 26 12/28/2022  AST 27 10/08/2022  ALT 28 12/28/2022  ALT 30 10/08/2022 ECG done today and personally reviewed by myself shows sinus rhyhtm with first degree AVB.   ---------------------------------------------------------------------------ASSESSMENT:1. Paroxysmal atrial fibrillation  2. Coronary artery disease involving native coronary artery of native heart without angina pectoris  3. Dyslipidemia  RECOMMENDATIONS:63 year old male who has documented history of paroxysmal atrial fibrillation around the time of urosepsis in 2020.  He has not had symptoms suspicious for recurrence and he reports a 30-day monitor  obtained by his cardiologist in [place]  that was negative for paroxysmal atrial fibrillation since then.  He is in sinus rhythm on exam today.  We discussed overall fairly low risk of stroke and I recommended he start coated aspirin  81 mg daily.  Given he has not had recurrence of atrial fibrillation since then, no additional benefit to diltiazem  and he can discontinue this.On CT imaging November 2024 around the time of a fall and rib fracture, there is documentation of mild coronary atherosclerosis.  We discussed this diagnosis in detail.  He has been free of symptoms suspicious for angina.  I recommended an echo.  Aspirin  as above.  Although his cholesterol panel is reasonable, I recommended starting statin therapy given his known CAD.  He understands and is agreeable.  Today started rosuvastatin  10 mg daily and he will have fasting blood work in 6 weeks.I will see him in follow-up after the echo and likely yearly thereafter.  He will contact me if he has questions or concerns in regards to his heart attack.As always, please do not hesitate if questions or issues arise.  I hope this has been helpful to you and I look forward to continuing to work with you in the future.  Sincerely,Shellie Rogoff CHRISTINE Skarlette Lattner, MD [1] Past Medical History:Diagnosis Date  Atrial fibrillation   COVID-19 02/2019  Diverticulitis   Gout   Nephrolithiasis   Pyelonephritis 12/2018  ESBL E coli  Sepsis 12/2018  due to pyelonephritis   Shingles 2016 [2] Past Surgical History:Procedure Laterality Date  ANKLE FRACTURE SURGERY Left   COLONOSCOPY  11/06/2021  2 polys (tubular  adenoma). severe diverticulosis. Dr Georganne  FOREARM FRACTURE SURGERY Left   HIP REPLACEMENT Right 10/2019  [facility]  INGUINAL HERNIA REPAIR Right 07/2017  INGUINAL HERNIA REPAIR Left 2012  LITHOTRIPSY  2021  x2  PR ARTHRP ACETBLR/PROX FEM PROSTC AGRFT/ALGRFT Left 09/09/2021  Procedure: LEFT  ARTHROPLASTY, HIP, TOTAL, ANTERIOR APPROACH;  Surgeon: Helayne Katz, MD;  Location: [facility];  Service: Orthopedics  UMBILICAL HERNIA REPAIR  07/2018  incisional hernia repaired [3] Family HistoryAdopted: Yes Problem Relation Name Age of Onset  Anesthesia problems Neg Hx   [4] Social HistorySocioeconomic History  Marital status: Married Tobacco Use  Smoking status: Never  Smokeless tobacco: Never Substance and Sexual Activity  Alcohol use: Not Currently  Drug use: Never  Sexual activity: Yes   Partners: Female   Comment: monogamous Social History Narrative  [place] between [place] .  Lives with wife, Jorge Boyd.   3 kids and 2 granddaughters.  Business Careers information officer. [5] AllergiesAllergen Reactions  Demerol Hcl [Meperidine] Nausea And Vomiting

## 2023-09-07 ENCOUNTER — Telehealth: Payer: Self-pay

## 2023-09-07 NOTE — Telephone Encounter (Signed)
 Spoke with patient , he is going to [place] . He will have ECHO there if we do not have an opening from the wait listBATTAGLIA CHECK OUT. Follow-up disposition: Follow up for echo and f/up next avalable .

## 2023-09-08 ENCOUNTER — Other Ambulatory Visit: Payer: Self-pay | Admitting: Family Medicine

## 2023-09-08 ENCOUNTER — Encounter: Payer: Self-pay | Admitting: Family Medicine

## 2023-09-08 DIAGNOSIS — E669 Obesity, unspecified: Secondary | ICD-10-CM

## 2023-09-08 MED ORDER — SEMAGLUTIDE-WEIGHT MANAGEMENT 1 MG/0.5ML SC SOAJ *I*
1.0000 mg | SUBCUTANEOUS | 0 refills | Status: DC
Start: 2023-09-08 — End: 2023-10-06

## 2023-09-08 NOTE — Telephone Encounter (Signed)
 The patient is asking for the next dosage.

## 2023-09-08 NOTE — Telephone Encounter (Signed)
 Last office visit with doctor: 11/18/2024Last office visit with APP: 9/10/2024Patients upcoming appointments:Future Appointments Date Time Provider Department Center 09/13/2023 10:30 AM Carlton Pipe, MD [facility] None 10/13/2023 10:15 AM Arlana Tobias SAUNDERS, MD [facility] None Recent Lab results:GENERAL CHEMISTRY Recent Labs   11/26/240836 09/19/240801 09/06/240827 NA 137 138 140 K 4.1 4.1 4.4 CL 103 102 102 CO2 23 22 25  GAP 11 14 13  UN 25* 31* 29* CREAT 1.15 1.18* 1.26* GLU 92 125* 122* CA 9.8 10.2 9.9 URIC  --   --  6.0  LIPID PROFILE Recent Labs   11/26/240836 09/06/240827 CHOL 161 161 TRIG 138 121 HDL 30* 33* LDLC 103 104  LIVER PROFILE Recent Labs   11/26/240836 09/06/240827 ALT 28 30 AST 26 27 ALK 105 104 TB 0.9 0.6  DIABETES THYROID  Recent Labs   11/26/240836 09/06/240827 HA1C 5.1 5.7*  Recent Labs   11/26/240836 09/06/240827 TSH 4.12 3.06   Pending/Orders Labs:Lab Frequency Next Occurrence MR shoulder RIGHT without contrast Once 12/20/2022 US  renal retroperitoneal complete Once 12/20/2022 MR shoulder LEFT without contrast Once 12/20/2022 MR shoulder RIGHT without contrast Once 02/10/2023 Hemoglobin A1c Once 03/27/2023 Lipid Panel (Reflex to Direct  LDL if Triglycerides more than 400) Once 03/27/2023 Testosterone , Free by Immunoassay (Adult Males or Individuals on Testosterone  Hormone Therapy) Once 03/27/2023 Aerobic culture (urine-voided) Once 06/01/2023 Urinalysis with reflex to microscopic Once 06/01/2023 Comprehensive metabolic panel Once 08/31/2023 TSH Once 08/31/2023 PSA (eff.05-2008) Once 08/31/2023 CBC Once 08/31/2023 Echo Complete Once 09/13/2023 Comprehensive metabolic panel Once 10/18/2023 Lipid Panel (Reflex to Direct  LDL if Triglycerides more than 400) Once 10/18/2023 Magnesium  Once 10/18/2023 Lactate, plasma (CONDITIONAL) ONE TIME   Opioid Drug  Screen:No results for input(s): AMPU, BEU, OPSU, OXYU, THCU, BZDU, COPS, CTHC, UCRNC, TRAMD in the last 8760 hours.

## 2023-09-12 ENCOUNTER — Other Ambulatory Visit: Payer: Self-pay

## 2023-09-13 ENCOUNTER — Ambulatory Visit: Attending: Sports Medicine | Admitting: Sports Medicine

## 2023-09-13 ENCOUNTER — Encounter: Payer: Self-pay | Admitting: Sports Medicine

## 2023-09-13 VITALS — BP 124/79 | HR 78 | Ht 76.0 in | Wt 222.0 lb

## 2023-09-13 DIAGNOSIS — M75121 Complete rotator cuff tear or rupture of right shoulder, not specified as traumatic: Secondary | ICD-10-CM

## 2023-09-13 NOTE — Progress Notes (Signed)
 History:  Jorge Boyd is a 62 y.o. that is being seen as a new patient for evaluation of his Bilateral shoulder pain R>L.  The patient is present today reporting a previous injury to both shoulders. He notes in 2023 he had a dislocation event of the left shoulder which had to be manually reduced. He does not have imaging of the left side to review today. He also reports a fall off a ladder at the end of last year. He was seen following this injury and received an MRI which found a massive rotator cuff tear. 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 He reports that he has been receiving cortisone injections to both shoulders and the most recent are February for the right and April for the left.  The patient is right UE dominant.Patient had an independent historian present for visit. noPast medical history, past surgical history, medications, allergies, family history, social history, and review of systems were reviewed today and have been documented separately in this encounter.  Tobacco Use: Low Risk  (09/13/2023)  Patient History   Smoking Tobacco Use: Never   Smokeless Tobacco Use: Never   Passive Exposure: Not on file Physical Examination:He is in no acute distress.  He is alert and oriented x 3.  Skin appearance is normal with no evidence of rash or other lesions.  Range of Motion: AROMThe bilateral shoulder demonstrates active forward elevation to 180 degrees. He externally rotates to 60 degrees. He internally rotates to Midthoracic. Strength:Forward Elevation MMT 4+/5External rotation MMT 4+/5Special tests:Hawkins test was positive.  Neer test was positive.  ER lag sign test was negativeBelly Press sign was negativeJobe's test was positivePalpation:There is not tenderness at the [facility] joint. There is tenderness along the biceps tendon. Distally he is  neurovascularly intact.Imaging: I personally reviewed the patients images.X-Ray:images viewed today were AP, Lateral, and Axillary and FMP:cpztd reviewed today were coronal , sagittal, and axial which demonstrates massive rotator cuff tear, moderate degenerative changes to the acromioclavicular and glenohumeral jointsAssessment:  Right shoulder massive rotator cuff tearPlan: I discussed the diagnosis and the treatment options with the patient, including continued conservative management versus surgical intervention. The patient is very functional for the presentation of his right shoulder. He is advised to continue with conservative management while he can still elevate and rotate the shoulder and then consider a joint replacement. We will see him back in as needed to evaluate.I, Damien LABOR Fettinger, ATC , am scribing for and in the presence of Dr. Lexxus Underhill.  I did not contribute to the evaluation and management portion of the service provided by the physician.  I contributed to the service as a scribe. I, Dr. Cordie Beazley, personally performed the services described in this documentation, as scribed by Damien LABOR Fettinger, ATC in my presence, and it is accurate and complete.

## 2023-09-28 ENCOUNTER — Other Ambulatory Visit: Payer: Self-pay | Admitting: Family Medicine

## 2023-09-28 NOTE — Telephone Encounter (Signed)
 Last office visit with doctor: 11/18/2024Last office visit with APP: 9/10/2024Patients upcoming appointments:Future Appointments Date Time Provider Department Center 10/13/2023 10:15 AM Arlana Tobias SAUNDERS, MD [facility] None Recent Lab results:GENERAL CHEMISTRY Recent Labs   11/26/240836 09/19/240801 09/06/240827 NA 137 138 140 K 4.1 4.1 4.4 CL 103 102 102 CO2 23 22 25  GAP 11 14 13  UN 25* 31* 29* CREAT 1.15 1.18* 1.26* GLU 92 125* 122* CA 9.8 10.2 9.9 URIC  --   --  6.0  LIPID PROFILE Recent Labs   11/26/240836 09/06/240827 CHOL 161 161 TRIG 138 121 HDL 30* 33* LDLC 103 104  LIVER PROFILE Recent Labs   11/26/240836 09/06/240827 ALT 28 30 AST 26 27 ALK 105 104 TB 0.9 0.6  DIABETES THYROID  Recent Labs   11/26/240836 09/06/240827 HA1C 5.1 5.7*  Recent Labs   11/26/240836 09/06/240827 TSH 4.12 3.06   Pending/Orders Labs:Lab Frequency Next Occurrence MR shoulder RIGHT without contrast Once 12/20/2022 US  renal retroperitoneal complete Once 12/20/2022 MR shoulder LEFT without contrast Once 12/20/2022 MR shoulder RIGHT without contrast Once 02/10/2023 Hemoglobin A1c Once 03/27/2023 Lipid Panel (Reflex to Direct  LDL if Triglycerides more than 400) Once 03/27/2023 Testosterone , Free by Immunoassay (Adult Males or Individuals on Testosterone  Hormone Therapy) Once 03/27/2023 Aerobic culture (urine-voided) Once 06/01/2023 Urinalysis with reflex to microscopic Once 06/01/2023 Comprehensive metabolic panel Once 08/31/2023 TSH Once 08/31/2023 PSA (eff.05-2008) Once 08/31/2023 CBC Once 08/31/2023 Echo Complete Once 09/13/2023 Comprehensive metabolic panel Once 10/18/2023 Lipid Panel (Reflex to Direct  LDL if Triglycerides more than 400) Once 10/18/2023 Magnesium  Once 10/18/2023 Lactate, plasma (CONDITIONAL) ONE TIME   Opioid Drug Screen:No results for input(s): AMPU, BEU,  OPSU, OXYU, THCU, BZDU, COPS, CTHC, UCRNC, TRAMD in the last 8760 hours.

## 2023-10-10 NOTE — Telephone Encounter (Signed)
 Do not see Wegovy  on med list please advise

## 2023-10-12 ENCOUNTER — Other Ambulatory Visit: Payer: Self-pay

## 2023-10-12 ENCOUNTER — Other Ambulatory Visit
Admission: RE | Admit: 2023-10-12 | Discharge: 2023-10-12 | Disposition: A | Discharge status: Home / Self Care | Source: Ambulatory Visit | Attending: Family Medicine | Admitting: Family Medicine

## 2023-10-12 DIAGNOSIS — R5383 Other fatigue: Secondary | ICD-10-CM | POA: Insufficient documentation

## 2023-10-12 DIAGNOSIS — E785 Hyperlipidemia, unspecified: Secondary | ICD-10-CM | POA: Insufficient documentation

## 2023-10-12 DIAGNOSIS — I10 Essential (primary) hypertension: Secondary | ICD-10-CM | POA: Insufficient documentation

## 2023-10-12 DIAGNOSIS — Z0000 Encounter for general adult medical examination without abnormal findings: Secondary | ICD-10-CM | POA: Insufficient documentation

## 2023-10-12 DIAGNOSIS — N400 Enlarged prostate without lower urinary tract symptoms: Secondary | ICD-10-CM | POA: Insufficient documentation

## 2023-10-12 DIAGNOSIS — R509 Fever, unspecified: Secondary | ICD-10-CM | POA: Insufficient documentation

## 2023-10-12 LAB — LIPID PANEL
Chol/HDL Ratio: 2.1
Cholesterol: 116 mg/dL
HDL: 56 mg/dL (ref 40–60)
LDL Calculated: 50 mg/dL
Non HDL Cholesterol: 60 mg/dL
Triglycerides: 34 mg/dL

## 2023-10-12 LAB — COMPREHENSIVE METABOLIC PANEL
ALT: 30 U/L (ref 0–50)
AST: 28 U/L (ref 0–50)
Albumin: 4.4 g/dL (ref 3.5–5.2)
Alk Phos: 98 U/L (ref 40–130)
Anion Gap: 14 (ref 7–16)
Bilirubin,Total: 0.6 mg/dL (ref 0.0–1.2)
CO2: 22 mmol/L (ref 20–28)
Calcium: 9.4 mg/dL (ref 8.6–10.2)
Chloride: 105 mmol/L (ref 96–108)
Creatinine: 0.94 mg/dL (ref 0.67–1.17)
Glucose: 107 mg/dL — ABNORMAL HIGH (ref 60–99)
Lab: 20 mg/dL (ref 6–20)
Potassium: 4.2 mmol/L (ref 3.3–5.1)
Sodium: 141 mmol/L (ref 133–145)
Total Protein: 6.3 g/dL (ref 6.3–7.7)
eGFR BY CREAT: 92

## 2023-10-12 LAB — TSH: TSH: 2.16 u[IU]/mL (ref 0.27–4.20)

## 2023-10-12 LAB — URINALYSIS WITH REFLEX TO MICROSCOPIC
Blood,UA: NEGATIVE
Glucose,UA: NEGATIVE
Ketones, UA: NEGATIVE
Leuk Esterase,UA: NEGATIVE
Nitrite,UA: NEGATIVE
Protein,UA: NEGATIVE
Specific Gravity,UA: 1.021 (ref 1.002–1.030)
pH,UA: 7 (ref 5.0–8.0)

## 2023-10-12 LAB — HEMOGLOBIN A1C: Hemoglobin A1C: 4.7 % (ref <=5.6)

## 2023-10-12 LAB — CBC
Hematocrit: 45 % (ref 37–52)
Hemoglobin: 14.8 g/dL (ref 12.0–17.0)
MCV: 96 fL (ref 75–100)
Platelets: 216 THOU/uL (ref 150–450)
RBC: 4.6 MIL/uL (ref 4.0–6.0)
RDW: 13.2 % (ref 0.0–15.0)
WBC: 6.5 THOU/uL (ref 3.5–11.0)

## 2023-10-12 LAB — SEX HORMONE BINDING GLOBULIN: Sex Hormone Binding Glob: 43 nmol/L (ref 10–80)

## 2023-10-12 LAB — TESTOSTERONE, FREE AND TOTAL BY IMMUNOASSAY (ADULT MALES OR INDIVIDUALS ON TESTOSTERONE HORMONE THERAPY)
Testosterone,% Free: 2 % (ref 1.6–2.9)
Testosterone,Free: 87 pg/mL (ref 47–244)
Testosterone: 495 ng/dL (ref 193–740)

## 2023-10-12 LAB — PSA (EFF.4-2010): PSA (eff. 4-2010): 1.81 ng/mL (ref 0.00–4.00)

## 2023-10-12 MED ORDER — SEMAGLUTIDE-WEIGHT MANAGEMENT 1.7 MG/0.75ML SC SOAJ *A*
1.7000 mg | SUBCUTANEOUS | 0 refills | Status: DC
Start: 2023-10-12 — End: 2023-10-13

## 2023-10-13 ENCOUNTER — Ambulatory Visit: Payer: BLUE CROSS/BLUE SHIELD | Admitting: Family Medicine

## 2023-10-13 ENCOUNTER — Ambulatory Visit: Payer: Self-pay | Admitting: Family Medicine

## 2023-10-13 ENCOUNTER — Encounter: Payer: Self-pay | Admitting: Family Medicine

## 2023-10-13 VITALS — BP 122/76 | HR 75 | Temp 98.1°F | Resp 20 | Ht 74.25 in | Wt 237.8 lb

## 2023-10-13 DIAGNOSIS — N289 Disorder of kidney and ureter, unspecified: Secondary | ICD-10-CM

## 2023-10-13 DIAGNOSIS — M75100 Unspecified rotator cuff tear or rupture of unspecified shoulder, not specified as traumatic: Secondary | ICD-10-CM | POA: Insufficient documentation

## 2023-10-13 DIAGNOSIS — E785 Hyperlipidemia, unspecified: Secondary | ICD-10-CM

## 2023-10-13 DIAGNOSIS — Z8739 Personal history of other diseases of the musculoskeletal system and connective tissue: Secondary | ICD-10-CM

## 2023-10-13 DIAGNOSIS — R161 Splenomegaly, not elsewhere classified: Secondary | ICD-10-CM

## 2023-10-13 DIAGNOSIS — E669 Obesity, unspecified: Secondary | ICD-10-CM

## 2023-10-13 DIAGNOSIS — I480 Paroxysmal atrial fibrillation: Secondary | ICD-10-CM

## 2023-10-13 DIAGNOSIS — Z0000 Encounter for general adult medical examination without abnormal findings: Secondary | ICD-10-CM

## 2023-10-13 DIAGNOSIS — I2510 Atherosclerotic heart disease of native coronary artery without angina pectoris: Secondary | ICD-10-CM

## 2023-10-13 LAB — AEROBIC BACTERIAL URINE CULTURE: Aerobic bacterial urine culture: 0

## 2023-10-13 LAB — URIC ACID: Urate: 5.1 mg/dL (ref 3.9–9.0)

## 2023-10-13 MED ORDER — ROSUVASTATIN CALCIUM 10 MG PO TABS *I*
10.0000 mg | ORAL_TABLET | Freq: Every day | ORAL | 3 refills | Status: AC
Start: 2023-10-13 — End: 2024-10-12

## 2023-10-13 MED ORDER — SEMAGLUTIDE-WEIGHT MANAGEMENT 1.7 MG/0.75ML SC SOAJ *A*
1.7000 mg | SUBCUTANEOUS | 0 refills | Status: DC
Start: 2023-10-13 — End: 2023-11-09

## 2023-10-13 NOTE — Progress Notes (Signed)
 Chief Complaint Patient presents with  Annual Exam HPI: Jorge Boyd is a 63 y.o. man with a history of atrial fibrillation, hypertension, gout, kidney stones, pyelonephritis, and osteoarthritis, here for physical. History of Present IllnessThe patient presents for a routine checkup.Diagnosed with bilateral significant shoulder tears causing constant pain, but maintains good range of motion and strength. Saw ortho in [facility] and saw Dr Voloshin locally. Pain increases with heavy lifting or moving activities. He will consider surgery if shoulder function worsens. Using topical DMSO with improvement. Persistent rib pain for 10 months due to previous rib fracture, considering DMSO for relief. Under cardiologist care in [place]  and [place]. Saw Dr Delman in Aug 2025. Recommended aspirin  81 mg daily. Recommended echo, which he will have done in [place] .  Upcoming appointment in 11/2023. Blood pressure well-controlled, now off meds.  For hyperlipidemia, taking Crestor  10 mg daily.  No new muscle aches from rosuvastatin . Sept 2025 LDL 50 Lost 80 pounds on GLP-1 agonist, currently 237 pounds. Plans to resume running on the beach in three days. No side effects from Wegovy , but less effective than Zepbound . Currently on Wegovy  1.7 mg, plans to increase to 2.4 mg after this month.  Had been on Zepbound  12.5 mg weekly and it was very effective.No recent gout flares since starting allopurinol .  Continues allopurinol  300 mg daily.Two lithotripsy procedures this year, had kidney imaging prior.  Urologist was not concerned by right renal lesion.No issues with Cialis , unsure if still needed. 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 No concerns about sexually transmitted infections, regularly sees dentist. Needs to schedule eye doctor appointment. Right kidney lesion:CT abdomen on 12/17/2022 shows a 2 x 1.2  cm anterior mid right renal lesion with overlying focus of atrophy or scar. Enlarged spleen:CT abdomen on 12/17/2022 shows splenomegaly 16 cm. No LUQ pain or tenderness. No known liver disease (LFTs remain normal), CBC remains normal.  No recent infections.Recommended repeat US  to reassess splenomegaly.Past medical history, problem list, medications and allergies personally reviewed in eRecord today. Review of Systems Constitutional:  Negative for chills, fever, malaise/fatigue and weight loss. HENT:  Negative for congestion, ear pain, hearing loss and sore throat.  Eyes:  Negative for blurred vision, double vision, pain, discharge and redness. Respiratory:  Negative for cough, shortness of breath and wheezing.  Cardiovascular:  Negative for chest pain, palpitations, orthopnea and leg swelling. Gastrointestinal:  Negative for abdominal pain, blood in stool, constipation, diarrhea, heartburn, nausea and vomiting. Genitourinary:  Negative for dysuria, frequency, hematuria and urgency. Musculoskeletal:  Positive for joint pain (shoulders, ribs). Negative for myalgias. Skin:  Negative for itching and rash. Neurological:  Negative for dizziness, tingling, sensory change, focal weakness and headaches. Endo/Heme/Allergies:  Negative for polydipsia. Does not bruise/bleed easily. Psychiatric/Behavioral:  Negative for depression. The patient is not nervous/anxious.  BP 122/76 (BP Location: Right arm, Patient Position: Sitting, Cuff Size: adult)   Pulse 75   Temp 36.7 C (98.1 F) (Temporal)   Resp 20   Ht 1.886 m (6' 2.25)   Wt 107.9 kg (237 lb 12.8 oz)   SpO2 97%   BMI 30.32 kg/m Physical ExamConstitutional:     Appearance: Normal appearance. HENT:    Right Ear: Tympanic membrane, ear canal and external ear normal.    Left Ear: Tympanic membrane, ear canal and external ear normal.    Mouth/Throat:    Mouth: Mucous membranes are moist.    Pharynx:  Oropharynx is clear. No oropharyngeal exudate. Eyes:  Conjunctiva/sclera: Conjunctivae normal. Neck:    Thyroid : No thyromegaly. Cardiovascular:    Rate and Rhythm: Normal rate and regular rhythm.    Heart sounds: Normal heart sounds. Pulmonary:    Effort: Pulmonary effort is normal.    Breath sounds: Normal breath sounds. Abdominal:    General: Bowel sounds are normal. There is no distension.    Palpations: Abdomen is soft. There is no mass.    Tenderness: There is no abdominal tenderness. There is no guarding or rebound. Musculoskeletal:    Right lower leg: No edema.    Left lower leg: No edema. Lymphadenopathy:    Cervical: No cervical adenopathy. Neurological:    Mental Status: He is alert. Psychiatric:       Mood and Affect: Mood normal.       Behavior: Behavior normal. Assessment & PlanHealth maintenance:-Counseled on: diet, exercise, routine dental and eye care, smoking, alcoholImmunizations-Covid, Flu - recommended for this fall -Tetanus - UTD-Zoster - due for dose #2, recommended-Pneumonia - due, recommended-MMR - due, recommendedCancer screening-Colon - repeat due 2028 -Prostate - Sept 2025 - PSA 1.81 -Lung - not indicated STI, HIV - declinesHep C - declines Dentist - UTD Eye doctor - dueDM - Sept 2025 - A1c 4.7 Diet [place] HCP - on file, reviewed Bilateral shoulder pain:- Constant pain due to massive tears, maintains good range of motion and strength- Will consider surgical intervention if symptoms worsen.Atrial fibrillation:- No longer exhibits symptoms- Echocardiogram recommended- Follow up with [place]  cardiologist in 10/2025Hyperlipidemia:- Cholesterol levels improved on Crestor , LDL well-controlled at 50.- Continues Crestor  10 mg daily.Weight management:- Significant weight loss on GLP-1 agonist, recent slight gain- Wegovy  less effective  than Zepbound , plan to increase dosage to 2.4 mg after one monthGout:- No flares since starting allopurinol  four years ago- Will add on uric acid level to blood work from yesterday.  Continues allopurinol  300 mg daily for nowSplenomegaly seen on imaging last year- Plan for abdominal ultrasound to reassess.  Jorge Boyd will have this done in [place] .Follow up: 1 year/sooner as needed

## 2023-10-13 NOTE — Patient Instructions (Addendum)
 Abdominal ultrasound - to follow up kidney spot and enlarged spleen. Vaccines-flu and covid shots this fall -prevnar 20 (pneumonia vaccine) -2nd dose of shingles vaccine-MMR vaccine

## 2023-10-16 ENCOUNTER — Other Ambulatory Visit: Payer: Self-pay | Admitting: Family Medicine

## 2023-10-16 DIAGNOSIS — I10 Essential (primary) hypertension: Secondary | ICD-10-CM

## 2023-10-16 NOTE — Telephone Encounter (Signed)
 Last office visit with doctor: 9/11/2025Last office visit with APP: Visit date not foundPatients upcoming appointments:Future Appointments Date Time Provider Department Center 10/16/2024 10:45 AM Arlana Tobias SAUNDERS, MD [facility] None Recent Lab results:GENERAL CHEMISTRY Recent Labs   09/10/250819 11/26/240836 09/19/240801 NA 141 137 138 K 4.2 4.1 4.1 CL 105 103 102 CO2 22 23 22  GAP 14 11 14  UN 20 25* 31* CREAT 0.94 1.15 1.18* GLU 107* 92 125* CA 9.4 9.8 10.2 URIC 5.1  --   --   LIPID PROFILE Recent Labs   09/10/250819 11/26/240836 CHOL 116 161 TRIG 34 138 HDL 56 30* LDLC 50 103  LIVER PROFILE Recent Labs   09/10/250819 11/26/240836 ALT 30 28 AST 28 26 ALK 98 105 TB 0.6 0.9  DIABETES THYROID  Recent Labs   09/10/250819 11/26/240836 HA1C 4.7 5.1  Recent Labs   09/10/250819 11/26/240836 TSH 2.16 4.12   Pending/Orders Labs:Lab Frequency Next Occurrence MR shoulder RIGHT without contrast Once 12/20/2022 US  renal retroperitoneal complete Once 12/20/2022 MR shoulder LEFT without contrast Once 12/20/2022 MR shoulder RIGHT without contrast Once 02/10/2023 Echo Complete Once 09/13/2023 Comprehensive metabolic panel Once 10/18/2023 Lipid Panel (Reflex to Direct  LDL if Triglycerides more than 400) Once 10/18/2023 Magnesium  Once 10/18/2023 US  abdominal complete Once 10/13/2023 Uric acid Once 10/13/2023 Lactate, plasma (CONDITIONAL) ONE TIME   Opioid Drug Screen:No results for input(s): AMPU, BEU, OPSU, OXYU, THCU, BZDU, COPS, CTHC, UCRNC, TRAMD in the last 8760 hours.

## 2023-10-18 ENCOUNTER — Encounter: Payer: Self-pay | Admitting: Gastroenterology

## 2023-10-19 ENCOUNTER — Encounter: Payer: Self-pay | Admitting: Family Medicine

## 2023-10-20 NOTE — Telephone Encounter (Signed)
 This patient attachment is clinically relevant.  Please keep in the patient's chart.[x]  Document[]  PhotoBrief attachment description: April 2025 CT a/p report(Ex. L forearm rash, WC papers)Thank rosine Tobias JONELLE Arlana, MD

## 2023-10-24 ENCOUNTER — Other Ambulatory Visit: Payer: Self-pay | Admitting: Gastroenterology

## 2023-10-27 ENCOUNTER — Encounter: Payer: Self-pay | Admitting: Gastroenterology

## 2023-10-30 ENCOUNTER — Encounter: Payer: Self-pay | Admitting: Family Medicine

## 2023-10-30 DIAGNOSIS — R162 Hepatomegaly with splenomegaly, not elsewhere classified: Secondary | ICD-10-CM | POA: Insufficient documentation

## 2023-10-30 NOTE — Telephone Encounter (Signed)
 This patient attachment is clinically relevant.  Please keep in the patient's chart.[x]  Document[]  PhotoBrief attachment description: Abdominal ultrasound Sept 2025(Ex. L forearm rash, WC papers)Thank rosine Tobias JONELLE Arlana, MD

## 2023-11-01 ENCOUNTER — Other Ambulatory Visit: Payer: Self-pay | Admitting: Gastroenterology

## 2023-11-03 ENCOUNTER — Encounter: Payer: Self-pay | Admitting: Gastroenterology

## 2023-11-03 LAB — EKG 12-LEAD
P: 42 deg
PR: 234 ms
QRS: -14 deg
QRSD: 102 ms
QT: 372 ms
QTc: 407 ms
Rate: 72 {beats}/min
T: -22 deg

## 2023-11-04 ENCOUNTER — Encounter: Payer: Self-pay | Admitting: Family Medicine

## 2023-11-04 ENCOUNTER — Encounter: Payer: Self-pay | Admitting: Internal Medicine

## 2023-11-04 DIAGNOSIS — I77810 Thoracic aortic ectasia: Secondary | ICD-10-CM

## 2023-11-09 ENCOUNTER — Other Ambulatory Visit: Payer: Self-pay | Admitting: Family Medicine

## 2023-11-09 DIAGNOSIS — E669 Obesity, unspecified: Secondary | ICD-10-CM

## 2023-11-09 MED ORDER — SEMAGLUTIDE-WEIGHT MANAGEMENT 2.4 MG/0.75ML SC SOAJ *A*
2.4000 mg | SUBCUTANEOUS | 5 refills | Status: AC
Start: 2023-11-09 — End: ?

## 2023-11-09 NOTE — Telephone Encounter (Signed)
 Last office visit with doctor: 9/11/2025Last office visit with APP: Visit date not foundPatients upcoming appointments:Future Appointments Date Time Provider Department Center 10/16/2024 10:45 AM Arlana Tobias SAUNDERS, MD [facility] None Recent Lab results:GENERAL CHEMISTRY Recent Labs   09/10/250819 11/26/240836 NA 141 137 K 4.2 4.1 CL 105 103 CO2 22 23 GAP 14 11 UN 20 25* CREAT 0.94 1.15 GLU 107* 92 CA 9.4 9.8 URIC 5.1  --   LIPID PROFILE Recent Labs   09/10/250819 11/26/240836 CHOL 116 161 TRIG 34 138 HDL 56 30* LDLC 50 103  LIVER PROFILE Recent Labs   09/10/250819 11/26/240836 ALT 30 28 AST 28 26 ALK 98 105 TB 0.6 0.9  DIABETES THYROID  Recent Labs   09/10/250819 11/26/240836 HA1C 4.7 5.1  Recent Labs   09/10/250819 11/26/240836 TSH 2.16 4.12   Pending/Orders Labs:Lab Frequency Next Occurrence MR shoulder RIGHT without contrast Once 12/20/2022 US  renal retroperitoneal complete Once 12/20/2022 MR shoulder LEFT without contrast Once 12/20/2022 MR shoulder RIGHT without contrast Once 02/10/2023 Echo Complete Once 09/13/2023 Comprehensive metabolic panel Once 10/18/2023 Lipid Panel (Reflex to Direct  LDL if Triglycerides more than 400) Once 10/18/2023 Magnesium  Once 10/18/2023 US  abdominal complete Once 10/13/2023 Uric acid Once 10/13/2023 Echo Complete Once 07/02/2024 Lactate, plasma (CONDITIONAL) ONE TIME   Opioid Drug Screen:No results for input(s): AMPU, BEU, OPSU, OXYU, THCU, BZDU, COPS, CTHC, UCRNC, TRAMD in the last 8760 hours.

## 2023-11-14 NOTE — Telephone Encounter (Signed)
 This patient attachment is clinically relevant.  Please keep in the patient's chart.[x]  Document[]  PhotoBrief attachment description: MRI chest(Ex. L forearm rash, WC papers)Thank rosine Tobias JONELLE Arlana, MD

## 2023-11-14 NOTE — Telephone Encounter (Signed)
 Echo 9/30/25Moderate LVH. Normal EF 60-65%. Moderate aneurysmal dilation of aortic root. Mild right sided enlargement and moderate LA enlargement. Mild mitral and tricuspid regurgitation. Mild pulmonary insufficiency. Grade 1 diastolic dysfunction.

## 2023-11-14 NOTE — Telephone Encounter (Signed)
 This patient attachment is clinically relevant.  Please keep in the patient's chart.[]  Document[x]  PhotoBrief attachment description: echo report(Ex. L forearm rash, WC papers)Thank rosine Tobias JONELLE Arlana, MD

## 2023-11-20 ENCOUNTER — Other Ambulatory Visit: Payer: Self-pay | Admitting: Registered Nurse

## 2023-11-20 DIAGNOSIS — Z8739 Personal history of other diseases of the musculoskeletal system and connective tissue: Secondary | ICD-10-CM

## 2023-11-21 NOTE — Telephone Encounter (Signed)
 Last office visit with doctor: 9/11/2025Last office visit with APP: Patients upcoming appointments:Future Appointments Date Time Provider Department Center 10/16/2024 10:45 AM Arlana Tobias SAUNDERS, MD [facility] None Recent Lab results:GENERAL CHEMISTRY Recent Labs   09/10/250819 11/26/240836 NA 141 137 K 4.2 4.1 CL 105 103 CO2 22 23 GAP 14 11 UN 20 25* CREAT 0.94 1.15 GLU 107* 92 CA 9.4 9.8 URIC 5.1  --   LIPID PROFILE Recent Labs   09/10/250819 11/26/240836 CHOL 116 161 TRIG 34 138 HDL 56 30* LDLC 50 103  LIVER PROFILE Recent Labs   09/10/250819 11/26/240836 ALT 30 28 AST 28 26 ALK 98 105 TB 0.6 0.9  DIABETES THYROID  Recent Labs   09/10/250819 11/26/240836 HA1C 4.7 5.1  Recent Labs   09/10/250819 11/26/240836 TSH 2.16 4.12   Pending/Orders Labs:Lab Frequency Next Occurrence MR shoulder RIGHT without contrast Once 12/20/2022 US  renal retroperitoneal complete Once 12/20/2022 MR shoulder LEFT without contrast Once 12/20/2022 MR shoulder RIGHT without contrast Once 02/10/2023 Echo Complete Once 09/13/2023 Comprehensive metabolic panel Once 10/18/2023 Lipid Panel (Reflex to Direct  LDL if Triglycerides more than 400) Once 10/18/2023 Magnesium  Once 10/18/2023 US  abdominal complete Once 10/13/2023 Uric acid Once 10/13/2023 Echo Complete Once 07/02/2024 Lactate, plasma (CONDITIONAL) ONE TIME   Opioid Drug Screen:No results for input(s): AMPU, BEU, OPSU, OXYU, THCU, BZDU, COPS, CTHC, UCRNC, TRAMD in the last 8760 hours.

## 2024-01-21 ENCOUNTER — Other Ambulatory Visit: Payer: Self-pay | Admitting: Family Medicine

## 2024-01-21 DIAGNOSIS — I10 Essential (primary) hypertension: Secondary | ICD-10-CM

## 2024-01-22 NOTE — Telephone Encounter (Signed)
 Last office visit: Last Office Visit   Date Provider Department Visit Type Primary Dx  10/13/2023 Arlana Tobias SAUNDERS, MD [facility] Office Visit Health care maintenance   Last PA Office Visit   Date Provider Department Visit Type Primary Dx  10/12/2022 Karn Delon Helling, PA [facility] Office Visit Health care maintenance   Patients upcoming appointments:Future Appointments Date Time Provider Department Center 10/16/2024 10:45 AM Arlana Tobias SAUNDERS, MD [facility] None Recent Lab results:GENERAL CHEMISTRY Recent Labs   09/10/250819 NA 141 K 4.2 CL 105 CO2 22 GAP 14 UN 20 CREAT 0.94 GLU 107* CA 9.4 URIC 5.1  LIPID PROFILE Recent Labs   09/10/250819 CHOL 116 TRIG 34 HDL 56 LDLC 50  LIVER PROFILE Recent Labs   09/10/250819 ALT 30 AST 28 ALK 98 TB 0.6  DIABETES THYROID  Recent Labs   09/10/250819 HA1C 4.7  Recent Labs   09/10/250819 TSH 2.16   Pending/Orders Labs:Lab Frequency Next Occurrence MR shoulder RIGHT without contrast Once 02/10/2023 Echo Complete Once 09/13/2023 Comprehensive metabolic panel Once 10/18/2023 Lipid Panel (Reflex to Direct  LDL if Triglycerides more than 400) Once 10/18/2023 Magnesium  Once 10/18/2023 US  abdominal complete Once 10/13/2023 Uric acid Once 10/13/2023 Echo Complete Once 07/02/2024 Lactate, plasma (CONDITIONAL) ONE TIME   Opioid Drug Screen:No results for input(s): AMPU, BEU, OPSU, OXYU, THCU, BZDU, COPS, CTHC, UCRNC, TRAMD in the last 8760 hours.Last dispensed if controlled:

## 2024-03-07 ENCOUNTER — Other Ambulatory Visit: Payer: Self-pay | Admitting: Family Medicine

## 2024-03-07 NOTE — Telephone Encounter (Signed)
 Last office visit: Last Office Visit   Date Provider Department Visit Type Primary Dx  10/13/2023 Arlana Tobias SAUNDERS, MD [facility] Office Visit Health care maintenance   Last PA Office Visit   Date Provider Department Visit Type Primary Dx  10/12/2022 Karn Delon Helling, PA [facility] Office Visit Health care maintenance   Patients upcoming appointments:Future Appointments Date Time Provider Department Center 10/16/2024 10:45 AM Arlana Tobias SAUNDERS, MD [facility] None Recent Lab results:GENERAL CHEMISTRY Recent Labs   09/10/250819 NA 141 K 4.2 CL 105 CO2 22 GAP 14 UN 20 CREAT 0.94 GLU 107* CA 9.4 URIC 5.1  LIPID PROFILE Recent Labs   09/10/250819 CHOL 116 TRIG 34 HDL 56 LDLC 50  LIVER PROFILE Recent Labs   09/10/250819 ALT 30 AST 28 ALK 98 TB 0.6  DIABETES THYROID  Recent Labs   09/10/250819 HA1C 4.7  Recent Labs   09/10/250819 TSH 2.16   Pending/Orders Labs:Lab Frequency Next Occurrence Echo Complete Once 09/13/2023 Comprehensive metabolic panel Once 10/18/2023 Lipid Panel (Reflex to Direct  LDL if Triglycerides more than 400) Once 10/18/2023 Magnesium  Once 10/18/2023 US  abdominal complete Once 10/13/2023 Uric acid Once 10/13/2023 Echo Complete Once 07/02/2024 Lactate, plasma (CONDITIONAL) ONE TIME   Opioid Drug Screen:No results for input(s): AMPU, BEU, OPSU, OXYU, THCU, BZDU, COPS, CTHC, UCRNC, TRAMD in the last 8760 hours.Last dispensed if controlled:

## 2024-10-16 ENCOUNTER — Encounter: Admitting: Family Medicine
